# Patient Record
Sex: Male | Born: 1945 | Race: White | Hispanic: No | Marital: Married | Smoking: Former smoker
Health system: Southern US, Community
[De-identification: ages and names within clinical notes are randomized; demographics above are authoritative.]

## PROBLEM LIST (undated history)

## (undated) DIAGNOSIS — I255 Ischemic cardiomyopathy: Secondary | ICD-10-CM

## (undated) DIAGNOSIS — E785 Hyperlipidemia, unspecified: Secondary | ICD-10-CM

## (undated) DIAGNOSIS — Z9861 Coronary angioplasty status: Secondary | ICD-10-CM

## (undated) DIAGNOSIS — I5042 Chronic combined systolic (congestive) and diastolic (congestive) heart failure: Secondary | ICD-10-CM

## (undated) DIAGNOSIS — M1990 Unspecified osteoarthritis, unspecified site: Secondary | ICD-10-CM

## (undated) DIAGNOSIS — G4730 Sleep apnea, unspecified: Secondary | ICD-10-CM

## (undated) DIAGNOSIS — E119 Type 2 diabetes mellitus without complications: Secondary | ICD-10-CM

## (undated) DIAGNOSIS — I2510 Atherosclerotic heart disease of native coronary artery without angina pectoris: Secondary | ICD-10-CM

## (undated) DIAGNOSIS — I350 Nonrheumatic aortic (valve) stenosis: Secondary | ICD-10-CM

## (undated) DIAGNOSIS — I739 Peripheral vascular disease, unspecified: Secondary | ICD-10-CM

## (undated) DIAGNOSIS — I10 Essential (primary) hypertension: Secondary | ICD-10-CM

## (undated) HISTORY — PX: JOINT REPLACEMENT: SHX530

## (undated) HISTORY — PX: COLONOSCOPY: SHX174

## (undated) HISTORY — DX: Chronic combined systolic (congestive) and diastolic (congestive) heart failure: I50.42

## (undated) HISTORY — PX: OTHER SURGICAL HISTORY: SHX169

## (undated) HISTORY — DX: Type 2 diabetes mellitus without complications: E11.9

---
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
## 2001-12-25 DIAGNOSIS — Z9861 Coronary angioplasty status: Secondary | ICD-10-CM

## 2001-12-25 DIAGNOSIS — I2510 Atherosclerotic heart disease of native coronary artery without angina pectoris: Secondary | ICD-10-CM

## 2001-12-25 HISTORY — DX: Atherosclerotic heart disease of native coronary artery without angina pectoris: Z98.61

## 2001-12-25 HISTORY — DX: Atherosclerotic heart disease of native coronary artery without angina pectoris: I25.10

## 2001-12-25 HISTORY — PX: CARDIAC CATHETERIZATION: SHX172

## 2003-12-26 HISTORY — PX: REPLACEMENT TOTAL KNEE: SUR1224

## 2004-09-27 ENCOUNTER — Ambulatory Visit: Payer: Self-pay | Admitting: *Deleted

## 2004-10-10 ENCOUNTER — Ambulatory Visit: Payer: Self-pay | Admitting: *Deleted

## 2004-12-01 ENCOUNTER — Inpatient Hospital Stay (HOSPITAL_COMMUNITY): Admission: RE | Admit: 2004-12-01 | Discharge: 2004-12-05 | Payer: Self-pay | Admitting: Specialist

## 2014-03-19 ENCOUNTER — Ambulatory Visit: Payer: Self-pay | Admitting: Gastroenterology

## 2014-03-22 LAB — PATHOLOGY REPORT

## 2014-10-08 DIAGNOSIS — G609 Hereditary and idiopathic neuropathy, unspecified: Secondary | ICD-10-CM | POA: Insufficient documentation

## 2014-12-10 ENCOUNTER — Ambulatory Visit (INDEPENDENT_AMBULATORY_CARE_PROVIDER_SITE_OTHER): Payer: Medicare Other

## 2014-12-10 ENCOUNTER — Ambulatory Visit (INDEPENDENT_AMBULATORY_CARE_PROVIDER_SITE_OTHER): Payer: Medicare Other | Admitting: Podiatry

## 2014-12-10 ENCOUNTER — Encounter: Payer: Self-pay | Admitting: Podiatry

## 2014-12-10 VITALS — BP 158/85 | HR 73 | Resp 16 | Ht 75.0 in | Wt 280.0 lb

## 2014-12-10 DIAGNOSIS — M2580 Other specified joint disorders, unspecified joint: Secondary | ICD-10-CM

## 2014-12-10 DIAGNOSIS — M205X2 Other deformities of toe(s) (acquired), left foot: Secondary | ICD-10-CM

## 2014-12-10 DIAGNOSIS — M722 Plantar fascial fibromatosis: Secondary | ICD-10-CM

## 2014-12-10 NOTE — Progress Notes (Signed)
Subjective:    Patient ID: Kurt Kelley, male    DOB: Jun 29, 1946, 68 y.o.   MRN: [id number]  HPI Comments: 68 year old male presents the office today with left heel pain which has been ongoing for proximally 30 years. He states that he has pain also around the bottom of his big toe and the ball of the foot. He states that he has pain in his foot after sitting for periods of time or sleeping. He states that after he walks for short period of time he does have some resolution of symptoms. Patient also states he has poor circulation in his left leg and he is previously seen a vascular surgeon other physicians for this. He states that he is unable to walk greater than one half a block due to pain in his leg and he gets cramping in his leg. He is previously undergone multiple noninvasive vascular testing. He states he is also previous is seen a foot specialist approximately 10 years ago where he was given an injection for plantar fasciitis which helped. He states that the majority of his pain is in the ball of his first big toe joint more than the heel this time. No recent injury or trauma to the area. No other complaints at this time.  Foot Pain      Review of Systems  Cardiovascular:       Calf pain  Gastrointestinal:       Bloating  Skin:       Change in nails  Neurological: Positive for tremors.  All other systems reviewed and are negative.      Objective:   Physical Exam AAO 3, NAD DP/PT pulses are palpable bilaterally, CRT less than 3 seconds Protective sensation intact with Simms Weinstein monofilament, vibratory sensation intact, Achilles tendon reflex intact. There is mild tenderness to palpation overlying the plantar medial tubercle of the calcaneus on the left side at the insertion of the plantar fascia. There is no pain on the course of the plantar fascia within the arch of the foot and the ligament appears to be intact. There is no pain on the posterior aspect of the  calcaneus or along the course/insertion of the Achilles tendon. No pain with lateral compression of the calcaneus or pain with vibratory sensation. There is discomfort overlying the sesamoids on the left foot. There is limitation of motion of the first MTPJ on the left. There is no pain with first MTPJ range of motion and there is no pain on the course of the hallux with a metatarsal. There is no overlying edema, erythema, increase in warmth to the foot/ankle. MMT 5/5, ROM WNL, except otherwise stated. There is no pain with calf compression, swelling, warmth, erythema. No open lesions or pre-ulcerative lesions.       Assessment & Plan:  68 year old male with heel pain, likely plantar fasciitis; sesamoiditis; likely PVD -X-rays were obtained and reviewed with the patient. -Conservative versus surgical treatment were discussed including alternatives, risks, complications. -Discussed likely etiologies of his pain. -At this time as the sesamoids appear to be more symptomatic, discussed a steroid injection into this area which she agrees. Verbal consent was obtained. Under sterile conditions a total of 2 mL of a mixture of dexamethasone phosphate and 0.5% Marcaine plain, 2% lidocaine plain was infiltrated around the sesamoid complex without complications. Band-Aid was applied. Post injection instructions were discussed with the patient. -Follow-up in 2 weeks. At that time if he continues to have pain in the heel  we'll consider steroid injection into the heel at that time as well. However this time the heel was not as symptomatic as the sesamoids. Discussed stretching exercises. Patient states that he does not like to wear anything in his shoes or on his foot and does not want to wear orthotics or plantar fascial brace. -Recommended to continue to follow up with vascular surgery as some of the symptoms do appear to be PVD related. -Follow-up in 2 weeks or sooner if any problems should arise. (He will be going  on vacation so discussed follow-up as needed). In the meantime, call the office with any questions, concerns, change in symptoms.

## 2015-01-19 ENCOUNTER — Ambulatory Visit: Payer: Medicare Other | Admitting: Podiatry

## 2015-07-20 DIAGNOSIS — I2510 Atherosclerotic heart disease of native coronary artery without angina pectoris: Secondary | ICD-10-CM | POA: Insufficient documentation

## 2015-07-20 DIAGNOSIS — E782 Mixed hyperlipidemia: Secondary | ICD-10-CM | POA: Insufficient documentation

## 2016-10-06 DIAGNOSIS — M47812 Spondylosis without myelopathy or radiculopathy, cervical region: Secondary | ICD-10-CM | POA: Insufficient documentation

## 2016-10-09 ENCOUNTER — Other Ambulatory Visit: Payer: Self-pay | Admitting: Surgery

## 2016-10-09 DIAGNOSIS — M47812 Spondylosis without myelopathy or radiculopathy, cervical region: Secondary | ICD-10-CM

## 2016-10-31 ENCOUNTER — Ambulatory Visit: Admission: RE | Admit: 2016-10-31 | Payer: Medicare Other | Source: Ambulatory Visit

## 2016-11-10 ENCOUNTER — Ambulatory Visit
Admission: RE | Admit: 2016-11-10 | Discharge: 2016-11-10 | Disposition: A | Discharge status: Home / Self Care | Payer: Medicare Other | Source: Ambulatory Visit | Attending: Surgery | Admitting: Surgery

## 2016-11-10 ENCOUNTER — Encounter (INDEPENDENT_AMBULATORY_CARE_PROVIDER_SITE_OTHER): Payer: Self-pay

## 2016-11-10 DIAGNOSIS — M47812 Spondylosis without myelopathy or radiculopathy, cervical region: Secondary | ICD-10-CM | POA: Insufficient documentation

## 2016-11-10 DIAGNOSIS — M4802 Spinal stenosis, cervical region: Secondary | ICD-10-CM | POA: Insufficient documentation

## 2016-12-20 DIAGNOSIS — M5412 Radiculopathy, cervical region: Secondary | ICD-10-CM | POA: Insufficient documentation

## 2016-12-20 DIAGNOSIS — G5603 Carpal tunnel syndrome, bilateral upper limbs: Secondary | ICD-10-CM | POA: Insufficient documentation

## 2017-01-17 DIAGNOSIS — J449 Chronic obstructive pulmonary disease, unspecified: Secondary | ICD-10-CM | POA: Insufficient documentation

## 2017-01-17 DIAGNOSIS — G4733 Obstructive sleep apnea (adult) (pediatric): Secondary | ICD-10-CM | POA: Insufficient documentation

## 2017-01-23 HISTORY — PX: CERVICAL SPINE SURGERY: SHX589

## 2017-10-15 ENCOUNTER — Encounter: Payer: Self-pay | Admitting: *Deleted

## 2017-10-24 ENCOUNTER — Encounter
Admission: RE | Disposition: A | Discharge status: Home / Self Care | Payer: Self-pay | Source: Ambulatory Visit | Attending: Surgery

## 2017-10-24 ENCOUNTER — Ambulatory Visit: Payer: Medicare Other | Admitting: Anesthesiology

## 2017-10-24 ENCOUNTER — Encounter: Payer: Self-pay | Admitting: Anesthesiology

## 2017-10-24 ENCOUNTER — Ambulatory Visit
Admission: RE | Admit: 2017-10-24 | Discharge: 2017-10-24 | Disposition: A | Discharge status: Home / Self Care | Payer: Medicare Other | Source: Ambulatory Visit | Attending: Surgery | Admitting: Surgery

## 2017-10-24 DIAGNOSIS — Z87891 Personal history of nicotine dependence: Secondary | ICD-10-CM | POA: Insufficient documentation

## 2017-10-24 DIAGNOSIS — Z6836 Body mass index (BMI) 36.0-36.9, adult: Secondary | ICD-10-CM | POA: Insufficient documentation

## 2017-10-24 DIAGNOSIS — G5601 Carpal tunnel syndrome, right upper limb: Secondary | ICD-10-CM | POA: Diagnosis not present

## 2017-10-24 DIAGNOSIS — M1990 Unspecified osteoarthritis, unspecified site: Secondary | ICD-10-CM | POA: Diagnosis not present

## 2017-10-24 DIAGNOSIS — I10 Essential (primary) hypertension: Secondary | ICD-10-CM | POA: Diagnosis not present

## 2017-10-24 DIAGNOSIS — G4730 Sleep apnea, unspecified: Secondary | ICD-10-CM | POA: Insufficient documentation

## 2017-10-24 HISTORY — DX: Essential (primary) hypertension: I10

## 2017-10-24 HISTORY — PX: CARPAL TUNNEL RELEASE: SHX101

## 2017-10-24 HISTORY — DX: Sleep apnea, unspecified: G47.30

## 2017-10-24 HISTORY — DX: Unspecified osteoarthritis, unspecified site: M19.90

## 2017-10-24 SURGERY — RELEASE, CARPAL TUNNEL, ENDOSCOPIC
Anesthesia: Regional | Laterality: Right | Wound class: Clean

## 2017-10-24 MED ORDER — HYDROCODONE-ACETAMINOPHEN 5-325 MG PO TABS: 1.0000 | ORAL_TABLET | Freq: Four times a day (QID) | ORAL | 0 refills | Status: DC | PRN

## 2017-10-24 MED ORDER — ACETAMINOPHEN 160 MG/5ML PO SOLN: 325.0000 mg | ORAL | Status: DC | PRN

## 2017-10-24 MED ORDER — PROPOFOL 500 MG/50ML IV EMUL
INTRAVENOUS | Status: DC | PRN
  Administered 2017-10-24: 75 ug/kg/min via INTRAVENOUS

## 2017-10-24 MED ORDER — FENTANYL CITRATE (PF) 100 MCG/2ML IJ SOLN
INTRAMUSCULAR | Status: DC | PRN
  Administered 2017-10-24: 100 ug via INTRAVENOUS

## 2017-10-24 MED ORDER — ACETAMINOPHEN 325 MG PO TABS: 325.0000 mg | ORAL_TABLET | ORAL | Status: DC | PRN

## 2017-10-24 MED ORDER — FENTANYL CITRATE (PF) 100 MCG/2ML IJ SOLN: 25.0000 ug | INTRAMUSCULAR | Status: DC | PRN

## 2017-10-24 MED ORDER — ONDANSETRON HCL 4 MG/2ML IJ SOLN
INTRAMUSCULAR | Status: DC | PRN
  Administered 2017-10-24: 4 mg via INTRAVENOUS

## 2017-10-24 MED ORDER — SODIUM CHLORIDE 0.9 % IV SOLN
900.0000 mg | Freq: Once | INTRAVENOUS | Status: AC
  Administered 2017-10-24: 900 mg via INTRAVENOUS

## 2017-10-24 MED ORDER — LACTATED RINGERS IV SOLN
10.0000 mL/h | INTRAVENOUS | Status: DC
  Administered 2017-10-24: 10 mL/h via INTRAVENOUS

## 2017-10-24 MED ORDER — OXYCODONE HCL 5 MG PO TABS: 5.0000 mg | ORAL_TABLET | Freq: Once | ORAL | Status: DC | PRN

## 2017-10-24 MED ORDER — OXYCODONE HCL 5 MG/5ML PO SOLN: 5.0000 mg | Freq: Once | ORAL | Status: DC | PRN

## 2017-10-24 MED ORDER — MIDAZOLAM HCL 5 MG/5ML IJ SOLN
INTRAMUSCULAR | Status: DC | PRN
  Administered 2017-10-24: 2 mg via INTRAVENOUS

## 2017-10-24 MED ORDER — BUPIVACAINE HCL (PF) 0.5 % IJ SOLN
INTRAMUSCULAR | Status: DC | PRN
  Administered 2017-10-24: 10 mL

## 2017-10-24 MED ORDER — LIDOCAINE HCL (CARDIAC) 20 MG/ML IV SOLN
INTRAVENOUS | Status: DC | PRN
  Administered 2017-10-24: 50 mg via INTRAVENOUS

## 2017-10-24 SURGICAL SUPPLY — 28 items
BANDAGE ELASTIC 2 LF NS (GAUZE/BANDAGES/DRESSINGS) ×2 IMPLANT
BNDG CMPR MED 5X2 ELC HKLP NS (GAUZE/BANDAGES/DRESSINGS) ×1
BNDG COHESIVE 4X5 TAN STRL (GAUZE/BANDAGES/DRESSINGS) ×2 IMPLANT
BNDG ESMARK 4X12 TAN STRL LF (GAUZE/BANDAGES/DRESSINGS) ×2 IMPLANT
CHLORAPREP W/TINT 26ML (MISCELLANEOUS) ×2 IMPLANT
CORD BIP STRL DISP 12FT (MISCELLANEOUS) ×2 IMPLANT
COVER LIGHT HANDLE UNIVERSAL (MISCELLANEOUS) ×4 IMPLANT
CUFF TOURNIQUET DUAL PORT 18X3 (MISCELLANEOUS) ×2 IMPLANT
DRAPE SURG 17X11 SM STRL (DRAPES) ×2 IMPLANT
GAUZE PETRO XEROFOAM 1X8 (MISCELLANEOUS) ×2 IMPLANT
GAUZE SPONGE 4X4 12PLY STRL (GAUZE/BANDAGES/DRESSINGS) ×2 IMPLANT
GLOVE BIO SURGEON STRL SZ8 (GLOVE) ×2 IMPLANT
GLOVE INDICATOR 8.0 STRL GRN (GLOVE) ×2 IMPLANT
GOWN STRL REUS W/ TWL LRG LVL3 (GOWN DISPOSABLE) ×1 IMPLANT
GOWN STRL REUS W/ TWL XL LVL3 (GOWN DISPOSABLE) ×1 IMPLANT
GOWN STRL REUS W/TWL LRG LVL3 (GOWN DISPOSABLE) ×2
GOWN STRL REUS W/TWL XL LVL3 (GOWN DISPOSABLE) ×2
KIT CARPAL TUNNEL (MISCELLANEOUS) ×2
KIT ESCP INSRT D SLOT CANN KN (MISCELLANEOUS) ×1 IMPLANT
KIT ROOM TURNOVER OR (KITS) ×2 IMPLANT
NS IRRIG 500ML POUR BTL (IV SOLUTION) ×2 IMPLANT
PACK EXTREMITY ARMC (MISCELLANEOUS) ×2 IMPLANT
SPLINT WRIST/FOREARM XL RT TX9 (SOFTGOODS) ×1 IMPLANT
STOCKINETTE IMPERVIOUS 9X36 MD (GAUZE/BANDAGES/DRESSINGS) ×2 IMPLANT
STRAP BODY AND KNEE 60X3 (MISCELLANEOUS) ×2 IMPLANT
SUT PROLENE 4 0 PS 2 18 (SUTURE) ×2 IMPLANT
SUT VIC AB 3-0 SH 27 (SUTURE)
SUT VIC AB 3-0 SH 27X BRD (SUTURE) IMPLANT

## 2017-10-24 NOTE — Anesthesia Procedure Notes (Signed)
Anesthesia Regional Block: Bier block (IV Regional)   Pre-Anesthetic Checklist: ,, timeout performed, Correct Patient, Correct Site, Correct Laterality, Correct Procedure, Correct Position, site marked, Risks and benefits discussed,  Surgical consent,  Pre-op evaluation,  At surgeon's request and post-op pain management  Laterality: Right      Procedures:,,,,, intact distal pulses, Esmarch exsanguination,, #20gu IV placed and double tourniquet utilized  Narrative:  Start time: 10/24/2017 12:51 PM End time: 10/24/2017 12:52 PM  Performed by: With CRNAs  Anesthesiologist: Jola Babinski CRNA: Jimmy Picket  Additional Notes: Proximal and Distal cuffs inflated after exsanguination. Distal cuff deflated.Negative radial pulse noted. IV injected with 28mL 0.5% Lidocaine plain. Tolerated well by pt.

## 2017-10-24 NOTE — Anesthesia Preprocedure Evaluation (Addendum)
Anesthesia Evaluation    Airway Mallampati: II  TM Distance: >3 FB     Dental   Pulmonary sleep apnea , former smoker,    breath sounds clear to auscultation       Cardiovascular hypertension, (-) angina Rhythm:Regular Rate:Normal  Stress test 10/08/17 negative for ischemia  TTE 10/08/17: MILD LV SYSTOLIC DYSFUNCTION WITH MODERATE LVH NORMAL RV FUNCTION MILD to MODERATE MR MILD TR, AR MILD AS EF 40-45%   Neuro/Psych negative neurological ROS     GI/Hepatic negative GI ROS,   Endo/Other  Morbid obesity (BMI 36)  Renal/GU      Musculoskeletal  (+) Arthritis ,   Abdominal   Peds  Hematology negative hematology ROS (+)   Anesthesia Other Findings   Reproductive/Obstetrics                          Anesthesia Physical Anesthesia Plan  ASA: III  Anesthesia Plan: Bier Block and General   Post-op Pain Management:    Induction:   PONV Risk Score and Plan:   Airway Management Planned: Nasal Cannula and Natural Airway  Additional Equipment:   Intra-op Plan:   Post-operative Plan:   Informed Consent: I have reviewed the patients History and Physical, chart, labs and discussed the procedure including the risks, benefits and alternatives for the proposed anesthesia with the patient or authorized representative who has indicated his/her understanding and acceptance.     Plan Discussed with: CRNA  Anesthesia Plan Comments:         Anesthesia Quick Evaluation

## 2017-10-24 NOTE — Transfer of Care (Signed)
Immediate Anesthesia Transfer of Care Note  Patient: Kurt Kelley  Procedure(s) Performed: CARPAL TUNNEL RELEASE ENDOSCOPIC (Right )  Patient Location: PACU  Anesthesia Type: Bier Block, General  Level of Consciousness: awake, alert  and patient cooperative  Airway and Oxygen Therapy: Patient Spontanous Breathing and Patient connected to supplemental oxygen  Post-op Assessment: Post-op Vital signs reviewed, Patient's Cardiovascular Status Stable, Respiratory Function Stable, Patent Airway and No signs of Nausea or vomiting  Post-op Vital Signs: Reviewed and stable  Complications: No apparent anesthesia complications

## 2017-10-24 NOTE — Discharge Instructions (Addendum)
General Anesthesia, Adult, Care After These instructions provide you with information about caring for yourself after your procedure. Your health care provider may also give you more specific instructions. Your treatment has been planned according to current medical practices, but problems sometimes occur. Call your health care provider if you have any problems or questions after your procedure. What can I expect after the procedure? After the procedure, it is common to have:  Vomiting.  A sore throat.  Mental slowness.  It is common to feel:  Nauseous.  Cold or shivery.  Sleepy.  Tired.  Sore or achy, even in parts of your body where you did not have surgery.  Follow these instructions at home: For at least 24 hours after the procedure:  Do not: ? Participate in activities where you could fall or become injured. ? Drive. ? Use heavy machinery. ? Drink alcohol. ? Take sleeping pills or medicines that cause drowsiness. ? Make important decisions or sign legal documents. ? Take care of children on your own.  Rest. Eating and drinking  If you vomit, drink water, juice, or soup when you can drink without vomiting.  Drink enough fluid to keep your urine clear or pale yellow.  Make sure you have little or no nausea before eating solid foods.  Follow the diet recommended by your health care provider. General instructions  Have a responsible adult stay with you until you are awake and alert.  Return to your normal activities as told by your health care provider. Ask your health care provider what activities are safe for you.  Take over-the-counter and prescription medicines only as told by your health care provider.  If you smoke, do not smoke without supervision.  Keep all follow-up visits as told by your health care provider. This is important. Contact a health care provider if:  You continue to have nausea or vomiting at home, and medicines are not helpful.  You  cannot drink fluids or start eating again.  You cannot urinate after 8-12 hours.  You develop a skin rash.  You have fever.  You have increasing redness at the site of your procedure. Get help right away if:  You have difficulty breathing.  You have chest pain.  You have unexpected bleeding.  You feel that you are having a life-threatening or urgent problem. This information is not intended to replace advice given to you by your health care provider. Make sure you discuss any questions you have with your health care provider. Document Released: 03/19/2001 Document Revised: 05/15/2016 Document Reviewed: 11/25/2015 Elsevier Interactive Patient Education  2018 [employer].   Keep dressing dry and intact. Keep hand elevated above heart level. May shower after dressing removed on postop day 4 (Sunday). Cover sutures with Band-Aids after drying off, then re-apply Velcro splint. Apply ice to affected area frequently. Take ibuprofen 800 mg TID with meals for 7-10 days, then as necessary. Take pain medication as prescribed when needed.  Return for follow-up in 10-14 days or as scheduled.

## 2017-10-24 NOTE — H&P (Signed)
Paper H&P to be scanned into permanent record. H&P reviewed and patient re-examined. No changes. 

## 2017-10-24 NOTE — Op Note (Signed)
10/24/2017  1:29 PM  Patient:   Kurt Kelley  Pre-Op Diagnosis:   Right carpal tunnel syndrome.  Post-Op Diagnosis:   Same.  Procedure:   Endoscopic right carpal tunnel release.  Surgeon:   Maryagnes Amos, MD  Anesthesia:   Bier block  Findings:   As above.  Complications:   None  EBL:   0 cc  Fluids:   750 cc crystalloid  TT:   30 minutes at 250 mmHg  Drains:   None  Closure:   4-0 Prolene interrupted sutures  Brief Clinical Note:   The patient is a 71 year old male with a history of bilateral hand and wrist pain and paresthesias, right more symptomatically left.  A recent EMG has confirmed the presence of bilateral carpal tunnel syndrome.  His symptoms have persisted despite medications, activity modification, splinting, etc.  The patient presents at this time for an endoscopic right carpal tunnel release.   Procedure:   The patient was brought into the operating room and lain in the supine position. After adequate IV sedation was achieved, a timeout was performed to verify the appropriate surgical site before a Bier block was placed by the anesthesiologist and the tourniquet inflated to 250 mmHg. The right hand and upper extremity were prepped with ChloraPrep solution before being draped sterilely. Preoperative antibiotics were administered. An approximately 1.5-2 cm incision was made over the volar wrist flexion crease, centered over the palmaris longus tendon. The incision was carried down through the subcutaneous tissues with care taken to identify and protect any neurovascular structures. The distal forearm fascia was penetrated just proximal to the transverse carpal ligament. The soft tissues were released off the superficial and deep surfaces of the distal forearm fascia and this was released proximally for 3-4 cm under direct visualization.  Attention was directed distally. The Therapist, nutritional was passed beneath the transverse carpal ligament along the ulnar aspect of  the carpal tunnel and used to release any adhesions as well as to remove any adherent synovial tissue before first the smaller then the larger of the two dilators were passed beneath the transverse carpal ligament along the ulnar margin of the carpal tunnel. The slotted cannula was introduced and the endoscope was placed into the slotted cannula and the undersurface of the transverse carpal ligament visualized. The distal margin of the transverse carpal ligament was marked by placing a 25-gauge needle percutaneously at Kaplan's cardinal point so that it entered the distal portion of the slotted cannula. Under endoscopic visualization, the transverse carpal ligament was released from proximal to distal using the end-cutting blade. A second pass was performed to ensure complete release of the ligament. The adequacy of release was verified both endoscopically and by palpation using the freer elevator.  The wound was irrigated thoroughly with sterile saline solution before being closed using 4-0 Prolene interrupted sutures. A total of 10 cc of 0.5% plain Sensorcaine was injected in and around the incision before a sterile bulky dressing was applied to the wound. The patient was placed into a volar wrist splint before being awakened and returned to the recovery room in satisfactory condition after tolerating the procedure well.

## 2017-10-24 NOTE — Anesthesia Postprocedure Evaluation (Signed)
Anesthesia Post Note  Patient: Kurt Kelley  Procedure(s) Performed: CARPAL TUNNEL RELEASE ENDOSCOPIC (Right )  Patient location during evaluation: PACU Anesthesia Type: Bier Block Level of consciousness: awake Pain management: pain level controlled Vital Signs Assessment: post-procedure vital signs reviewed and stable Respiratory status: respiratory function stable Cardiovascular status: stable Postop Assessment: no signs of nausea or vomiting Anesthetic complications: no    Jola Babinski

## 2017-10-24 NOTE — Anesthesia Procedure Notes (Signed)
Procedure Name: MAC Performed by: Lucky Trotta Pre-anesthesia Checklist: Patient identified, Emergency Drugs available, Suction available, Patient being monitored and Timeout performed Patient Re-evaluated:Patient Re-evaluated prior to inductionOxygen Delivery Method: Simple face mask Placement Confirmation: positive ETCO2 and breath sounds checked- equal and bilateral       

## 2017-10-25 ENCOUNTER — Encounter: Payer: Self-pay | Admitting: Surgery

## 2018-02-06 DIAGNOSIS — I255 Ischemic cardiomyopathy: Secondary | ICD-10-CM | POA: Insufficient documentation

## 2018-04-09 ENCOUNTER — Ambulatory Visit (INDEPENDENT_AMBULATORY_CARE_PROVIDER_SITE_OTHER): Payer: Medicare Other | Admitting: Cardiovascular Disease

## 2018-04-09 ENCOUNTER — Encounter: Payer: Self-pay | Admitting: Cardiovascular Disease

## 2018-04-09 VITALS — BP 170/80 | HR 87 | Ht 75.0 in | Wt 287.5 lb

## 2018-04-09 DIAGNOSIS — I5022 Chronic systolic (congestive) heart failure: Secondary | ICD-10-CM

## 2018-04-09 DIAGNOSIS — I25118 Atherosclerotic heart disease of native coronary artery with other forms of angina pectoris: Secondary | ICD-10-CM

## 2018-04-09 DIAGNOSIS — I739 Peripheral vascular disease, unspecified: Secondary | ICD-10-CM

## 2018-04-09 DIAGNOSIS — E785 Hyperlipidemia, unspecified: Secondary | ICD-10-CM

## 2018-04-09 DIAGNOSIS — I350 Nonrheumatic aortic (valve) stenosis: Secondary | ICD-10-CM

## 2018-04-09 MED ORDER — EVOLOCUMAB 140 MG/ML ~~LOC~~ SOSY: 1.0000 "pen " | PREFILLED_SYRINGE | SUBCUTANEOUS | 11 refills | Status: DC

## 2018-04-09 NOTE — Progress Notes (Signed)
Cardiology Office Note   Date:  04/09/2018   ID:  Kurt Kelley, DOB [birth year], MRN [id number]  PCP:  Jerl Mina, MD  Cardiologist:   Lorine Bears, MD   Chief Complaint  Patient presents with  . New Patient (Initial Visit)    Referred by [facility] for CAD. Patient c/o SOB when lying down at night and leg pain in both legs for about 20 Years. Meds reviewed verballly with patient.       History of Present Illness: Kurt Kelley is a 72 y.o. male who is here to establish cardiovascular care.  He is transferring from Dr. Gwen Pounds.  The patient has known history of coronary artery disease with previous stent placement in 2003, chronic systolic heart failure due to mild ischemic cardiomyopathy, mild aortic stenosis, essential hypertension, hyperlipidemia, sleep apnea and obesity.  He has known history of intolerance to statins due to severe myalgia.  He also reports intolerance to CPAP due to claustrophobia.  He is aware of history of peripheral arterial disease and was seen many years ago by Dr. Orson Slick and was told about moderate 50% disease that did not require revascularization. The patient reports prolonged history of bilateral leg pain with exertion for more than 10 years.  The symptoms are mostly in both calfs.  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 He reports mild symptoms of exertional dyspnea and occasional chest pain. Most recent nuclear stress test in October 2018 showed normal perfusion with ejection fraction of 39%.  He had an echocardiogram done which showed an EF of 40-45% with mild aortic stenosis with reported valve area of 1.2 and mild to moderate MR.    Past Medical History:  Diagnosis Date  . Aortic valve stenosis - mild    TTE 10/08/17  . Arthritis    lower back, right knee  . Heart murmur   . Hypertension   . Sleep apnea     Past Surgical History:  Procedure Laterality Date  . CARDIAC CATHETERIZATION  2004   stent    . CARPAL TUNNEL RELEASE Right 10/24/2017   Procedure: CARPAL TUNNEL RELEASE ENDOSCOPIC;  Surgeon: Christena Flake, MD;  Location[facility];  Service: Orthopedics;  Laterality: Right;  sleep apnea  . CERVICAL SPINE SURGERY Right 01/23/2017   arthrodesis, discectomy, osteophytectomy, decompression  C3-C4, [facility]  . COLONOSCOPY    . KNEE SURGERY Right 1966  . REPLACEMENT TOTAL KNEE Left 2005     Current Outpatient Medications  Medication Sig Dispense Refill  . albuterol (PROVENTIL HFA;VENTOLIN HFA) 108 (90 Base) MCG/ACT inhaler Inhale into the lungs every 6 (six) hours as needed for wheezing or shortness of breath.    Marland Kitchen aspirin 81 MG tablet Take 81 mg by mouth daily.    Marland Kitchen doxazosin (CARDURA) 2 MG tablet Take by mouth.    . fenofibrate (TRICOR) 145 MG tablet Take by mouth.    . finasteride (PROSCAR) 5 MG tablet Take by mouth.    Marland Kitchen ibuprofen (ADVIL,MOTRIN) 800 MG tablet Take 800 mg by mouth daily as needed.    Marland Kitchen losartan (COZAAR) 100 MG tablet Take by mouth.    . senna-docusate (SENOKOT S) 8.6-50 MG tablet Take 1 tablet by mouth 2 (two) times daily as needed for mild constipation.     No current facility-administered medications for this visit.     Allergies:   Ampicillin and Capsicum    Social History:  The patient  reports that he  quit smoking about 31 years ago. He has never used smokeless tobacco. He reports that he drinks about 6.6 oz of alcohol per week.   Family History:  The patient's family history is not on file.    ROS:  Please see the history of present illness.   Otherwise, review of systems are positive for none.   All other systems are reviewed and negative.    PHYSICAL EXAM: VS:  BP (!) 170/80 (BP Location: Right Arm, Patient Position: Sitting, Cuff Size: Large)   Pulse 87   Ht 6\' 3"  (1.905 m)   Wt 287 lb 8 oz (130.4 kg)   BMI 35.94 kg/m  , BMI Body mass index is 35.94 kg/m. GEN: Well nourished, well developed, in no acute distress  HEENT: normal   Neck: no JVD, carotid bruits, or masses Cardiac: RRR; no rubs, or gallops,no edema .  2 out of 6 systolic ejection murmur in the aortic area. Respiratory:  clear to auscultation bilaterally, normal work of breathing GI: soft, nontender, nondistended, + BS MS: no deformity or atrophy  Skin: warm and dry, no rash Neuro:  Strength and sensation are intact Psych: euthymic mood, full affect Vascular: Femoral pulses are normal.  Distal pulses are not palpable.   EKG:  EKG is ordered today. The ekg ordered today demonstrates normal sinus rhythm with nonspecific ST changes.   Recent Labs: No results found for requested labs within last 8760 hours.    Lipid Panel No results found for: CHOL, TRIG, HDL, CHOLHDL, VLDL, LDLCALC, LDLDIRECT    Wt Readings from Last 3 Encounters:  04/09/18 287 lb 8 oz (130.4 kg)  10/24/17 286 lb (129.7 kg)  12/10/14 280 lb (127 kg)       PAD Screen 04/09/2018  Previous PAD dx? No  Previous surgical procedure? No  Pain with walking? Yes  Subsides with rest? No  Feet/toe relief with dangling? No  Painful, non-healing ulcers? No  Extremities discolored? Yes      ASSESSMENT AND PLAN:  1.  Peripheral arterial disease: The patient seems to have severe bilateral calf claudication likely due to SFA/popliteal disease.  I requested lower extremity arterial Doppler.  Continue treatment of risk factors.  2.  Coronary artery disease involving native coronary arteries with other forms of angina: He does report exertional dyspnea and chest pain.  Nuclear stress test in October of last year showed no significant perfusion defects but his EF was noted to be 39%.  The patient might ultimately require a right and left heart catheterization.  3.  Mild to moderate aortic stenosis: I am going to repeat his echocardiogram to evaluate his EF and follow-up on severity of aortic stenosis.  4.  Chronic systolic heart failure: Currently on losartan.  He is not on a  beta-blocker for some reasons.  I will consider adding carvedilol.  5.  Hyperlipidemia: Intolerance to statins.  Most recent lipid profile showed an LDL of 118.  I recommend treatment with a PC SK 9 inhibitor the patient was started on Repatha which has already been approved by his insurance.    Disposition:   FU with me in 1 month  Signed,  Lorine Bears, MD  04/09/2018 3:16 PM    [facility]

## 2018-04-09 NOTE — Patient Instructions (Addendum)
Medication Instructions:  Your physician has recommended you make the following change in your medication:  START Repatha 140mg /ml every 14 days. Please call the office if you need assistance in administering injection. Training is available.    Labwork: none  Testing/Procedures: Your physician has requested that you have an echocardiogram. Echocardiography is a painless test that uses sound waves to create images of your heart. It provides your doctor with information about the size and shape of your heart and how well your heart's chambers and valves are working. This procedure takes approximately one hour. There are no restrictions for this procedure.  Your physician has requested that you have a lower extremity arterial duplex. During this test, exercise and ultrasound are used to evaluate arterial blood flow in the legs. Allow one hour for this exam. There are no restrictions or special instructions.   Follow-Up: Your physician recommends that you schedule a follow-up appointment in: 1 month with Dr. Kirke Corin.    Any Other Special Instructions Will Be Listed Below (If Applicable).     If you need a refill on your cardiac medications before your next appointment, please call your pharmacy.

## 2018-04-19 ENCOUNTER — Telehealth: Payer: Self-pay | Admitting: Cardiovascular Disease

## 2018-04-19 NOTE — Telephone Encounter (Signed)
Please review for refill on repatha.

## 2018-04-19 NOTE — Telephone Encounter (Signed)
Pt wife calling asking if we can please send in orders for repatha as a pen and not a syringe  Please call this into [facility] mail [facility]

## 2018-04-22 ENCOUNTER — Other Ambulatory Visit: Payer: Self-pay | Admitting: Cardiovascular Disease

## 2018-04-22 DIAGNOSIS — I739 Peripheral vascular disease, unspecified: Secondary | ICD-10-CM

## 2018-04-22 MED ORDER — EVOLOCUMAB 140 MG/ML ~~LOC~~ SOSY: 1.0000 "pen " | PREFILLED_SYRINGE | SUBCUTANEOUS | 11 refills | Status: DC

## 2018-04-22 NOTE — Telephone Encounter (Signed)
Pharmacist reports that they already sent the prescription to the mail order but provided me with the information to send again.

## 2018-04-22 NOTE — Telephone Encounter (Signed)
Left voicemail message that this medication only comes in a pen and not other syringe with instructions to call back if she should have any further questions. Refill sent into speciality pharmacy as requested.

## 2018-04-23 NOTE — Telephone Encounter (Signed)
[facility] calling Needs a prior authorization of repatha for the auto injections  To contact call [phone] or Fax at [phone]

## 2018-04-24 ENCOUNTER — Ambulatory Visit (INDEPENDENT_AMBULATORY_CARE_PROVIDER_SITE_OTHER): Payer: Medicare Other

## 2018-04-24 DIAGNOSIS — I739 Peripheral vascular disease, unspecified: Secondary | ICD-10-CM

## 2018-04-24 MED ORDER — EVOLOCUMAB 140 MG/ML ~~LOC~~ SOAJ: 1.0000 "pen " | SUBCUTANEOUS | 3 refills | Status: DC

## 2018-04-24 NOTE — Addendum Note (Signed)
Addended by: Stann Mainland on: 04/24/2018 10:47 AM   Modules accepted: Orders

## 2018-04-24 NOTE — Telephone Encounter (Signed)
Notified patient's wife, ok per DPR. She was very appreciative and has number of specialty pharmacy to call if needed.

## 2018-04-24 NOTE — Telephone Encounter (Signed)
S/w representative at [facility]. He connected me to [facility]. I had received request for prior authorization and coverage determination. Representative at [facility] said the Repatha was already approved on 02/28/18 until further notice and nothing else was needed at this time. Rep. At [facility] said I could call the pharmacy back and have them run the PA number to make sure it works.  Approval Number [id number]. Ref # for call [id number]  Called [facility] back and pharmacist ran prior authorization number. It was approved with patient's insurance. I also confirmed that patient to use [employer] pen and pharmacist said this should be fine. They will contact the patient further about cost.

## 2018-04-25 ENCOUNTER — Ambulatory Visit (INDEPENDENT_AMBULATORY_CARE_PROVIDER_SITE_OTHER): Payer: Medicare Other

## 2018-04-25 ENCOUNTER — Other Ambulatory Visit: Payer: Self-pay

## 2018-04-25 DIAGNOSIS — I350 Nonrheumatic aortic (valve) stenosis: Secondary | ICD-10-CM | POA: Diagnosis not present

## 2018-04-25 MED ORDER — PERFLUTREN LIPID MICROSPHERE
1.0000 mL | INTRAVENOUS | Status: AC | PRN
  Administered 2018-04-25: 2 mL via INTRAVENOUS

## 2018-04-29 NOTE — Telephone Encounter (Signed)
Called [email or web address] and s/w representative. I let him know I had already received an approval number on 04/24/18. He said that should be all that is needed at this time and will close out the key [id number] since it is not needed at this time.

## 2018-04-29 NOTE — Telephone Encounter (Signed)
Cover my meds calling to check status of Repatha Prior Auth

## 2018-05-14 ENCOUNTER — Ambulatory Visit (INDEPENDENT_AMBULATORY_CARE_PROVIDER_SITE_OTHER): Payer: Medicare Other | Admitting: Cardiovascular Disease

## 2018-05-14 ENCOUNTER — Encounter: Payer: Self-pay | Admitting: Cardiovascular Disease

## 2018-05-14 VITALS — BP 154/77 | HR 71 | Ht 74.0 in | Wt 287.2 lb

## 2018-05-14 DIAGNOSIS — I350 Nonrheumatic aortic (valve) stenosis: Secondary | ICD-10-CM | POA: Diagnosis not present

## 2018-05-14 DIAGNOSIS — I25118 Atherosclerotic heart disease of native coronary artery with other forms of angina pectoris: Secondary | ICD-10-CM | POA: Diagnosis not present

## 2018-05-14 DIAGNOSIS — E785 Hyperlipidemia, unspecified: Secondary | ICD-10-CM

## 2018-05-14 DIAGNOSIS — I739 Peripheral vascular disease, unspecified: Secondary | ICD-10-CM | POA: Diagnosis not present

## 2018-05-14 DIAGNOSIS — I5022 Chronic systolic (congestive) heart failure: Secondary | ICD-10-CM | POA: Diagnosis not present

## 2018-05-14 MED ORDER — CARVEDILOL 3.125 MG PO TABS: 3.1250 mg | ORAL_TABLET | Freq: Two times a day (BID) | ORAL | 3 refills | Status: DC

## 2018-05-14 NOTE — Patient Instructions (Signed)
Medication Instructions: START Carvedilol 3.125 mg twice daily  If you need a refill on your cardiac medications before your next appointment, please call your pharmacy.    Follow-Up: Your physician wants you to follow-up in 4 months with Dr. Kirke Corin in [place].   Thank you for choosing [facility] at [facility]!!

## 2018-05-14 NOTE — Progress Notes (Signed)
Cardiology Office Note   Date:  05/14/2018   ID:  Kurt Kelley, DOB 1946/06/04, MRN [id number]  PCP:  Kurt Mina, MD  Cardiologist:   Kurt Bears, MD   Chief Complaint  Patient presents with  . Shortness of Breath    states some when lying down       History of Present Illness: Kurt Kelley is a 72 y.o. male who is here today for a follow-up visit.  The patient has known history of coronary artery disease with previous stent placement in 2003, chronic systolic heart failure due to mild ischemic cardiomyopathy, aortic stenosis, essential hypertension, hyperlipidemia, sleep apnea and obesity.  He has known history of intolerance to statins due to severe myalgia.  He also reports intolerance to CPAP due to claustrophobia.  He is aware of history of peripheral arterial disease and was seen many years ago by Dr. Orson Kelley and was told about moderate 50% disease that did not require revascularization. The patient reports prolonged history of bilateral leg pain with exertion for more than 10 years.  The symptoms are mostly in both calfs.  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 Most recent nuclear stress test in October 2018 showed normal perfusion with ejection fraction of 39%.  He had an echocardiogram done which showed an EF of 40-45% with mild aortic stenosis with reported valve area of 1.2 and mild to moderate MR.  I proceeded with vascular evaluation which showed mildly reduced ABI on the left side with evidence of borderline significant left SFA disease.  However, he reports that his symptoms are worse on the right side.  Exertional dyspnea is stable. An echocardiogram showed an EF of 50 to 55% with moderate aortic stenosis.  Mean gradient was 24 mmHg.   Past Medical History:  Diagnosis Date  . Aortic valve stenosis - mild    TTE 10/08/17  . Arthritis    lower back, right knee  . Heart murmur   . Hypertension   . Sleep apnea     Past  Surgical History:  Procedure Laterality Date  . CARDIAC CATHETERIZATION  2004   stent  . CARPAL TUNNEL RELEASE Right 10/24/2017   Procedure: CARPAL TUNNEL RELEASE ENDOSCOPIC;  Surgeon: Christena Flake, MD;  Location: [facility];  Service: Orthopedics;  Laterality: Right;  sleep apnea  . CERVICAL SPINE SURGERY Right 01/23/2017   arthrodesis, discectomy, osteophytectomy, decompression  C3-C4, [facility]  . COLONOSCOPY    . KNEE SURGERY Right 1966  . REPLACEMENT TOTAL KNEE Left 2005     Current Outpatient Medications  Medication Sig Dispense Refill  . albuterol (PROVENTIL HFA;VENTOLIN HFA) 108 (90 Base) MCG/ACT inhaler Inhale into the lungs every 6 (six) hours as needed for wheezing or shortness of breath.    Marland Kitchen aspirin 81 MG tablet Take 81 mg by mouth daily.    Marland Kitchen doxazosin (CARDURA) 2 MG tablet Take by mouth.    . Evolocumab (REPATHA SURECLICK) 140 MG/ML SOAJ Inject 1 pen into the skin every 14 (fourteen) days. 6 pen 3  . fenofibrate (TRICOR) 145 MG tablet Take by mouth.    . finasteride (PROSCAR) 5 MG tablet Take by mouth.    Marland Kitchen ibuprofen (ADVIL,MOTRIN) 800 MG tablet Take 800 mg by mouth daily as needed.    Marland Kitchen losartan (COZAAR) 100 MG tablet Take by mouth.    . senna-docusate (SENOKOT S) 8.6-50 MG tablet Take 1 tablet by mouth 2 (two) times daily as  needed for mild constipation.     No current facility-administered medications for this visit.     Allergies:   Ampicillin and Capsicum    Social History:  The patient  reports that he quit smoking about 31 years ago. He has never used smokeless tobacco. He reports that he drinks about 6.6 oz of alcohol per week.   Family History:  The patient's family history is not on file.    ROS:  Please see the history of present illness.   Otherwise, review of systems are positive for none.   All other systems are reviewed and negative.    PHYSICAL EXAM: VS:  BP (!) 154/77   Pulse 71   Ht 6\' 2"  (1.88 m)   Wt 287 lb 3.2 oz (130.3 kg)   SpO2  96%   BMI 36.87 kg/m  , BMI Body mass index is 36.87 kg/m. GEN: Well nourished, well developed, in no acute distress  HEENT: normal  Neck: no JVD, carotid bruits, or masses Cardiac: RRR; no rubs, or gallops,no edema .  2 out of 6 systolic ejection murmur in the aortic area. Respiratory:  clear to auscultation bilaterally, normal work of breathing GI: soft, nontender, nondistended, + BS MS: no deformity or atrophy  Skin: warm and dry, no rash Neuro:  Strength and sensation are intact Psych: euthymic mood, full affect Vascular: Femoral pulses are normal.  Distal pulses are not palpable.   EKG:  EKG is not ordered today.   Recent Labs: No results found for requested labs within last 8760 hours.    Lipid Panel No results found for: CHOL, TRIG, HDL, CHOLHDL, VLDL, LDLCALC, LDLDIRECT    Wt Readings from Last 3 Encounters:  05/14/18 287 lb 3.2 oz (130.3 kg)  04/09/18 287 lb 8 oz (130.4 kg)  10/24/17 286 lb (129.7 kg)       PAD Screen 04/09/2018  Previous PAD dx? No  Previous surgical procedure? No  Pain with walking? Yes  Subsides with rest? No  Feet/toe relief with dangling? No  Painful, non-healing ulcers? No  Extremities discolored? Yes      ASSESSMENT AND PLAN:  1.  Peripheral arterial disease: The patient has mildly reduced ABI on the left side with evidence of borderline significant disease affecting the left SFA.  However, his symptoms are worse in the right side and most likely seems to be multifactorial and not necessarily claudication.  Continue treatment of risk factors.  Continue to monitor his disease with possible repeat testing in 1 to 2 years.  2.  Coronary artery disease involving native coronary arteries with other forms of angina: His symptoms have been stable.  EF was 50 to 55% by recent echo nuclear stress test in October of last year showed no significant perfusion defects but his EF was noted to be 39%.  If symptoms worsen, the next step is a right  and left heart catheterization.  3.   moderate aortic stenosis:  Repeat echocardiogram in 1 year from now  4.  Chronic systolic heart failure: Currently on losartan.  He reports feeling bad in the past on beta-blockers but does not remember the name.  I elected to start him on small dose carvedilol 3.125 mg twice daily especially in the setting of elevated blood pressure.  5.  Hyperlipidemia: Intolerance to statins.  Most recent lipid profile showed an LDL of 118.   He is going to start Repatha next month but is trying to get some assistance from the company.  6.  Essential hypertension: Blood pressure is mildly elevated.  I added carvedilol as outlined above.  Cardura is not a good option long-term for him given his cardiac history and heart failure.  I asked him to discuss with his primary care physician about possibility of alternative treatments for his BPH such as Flomax in order to discontinue Cardura and uptitrate other heart failure medications.   Disposition:   FU with me in 4 months  Signed,  Kurt Bears, MD  05/14/2018 9:52 AM    [facility]

## 2018-05-27 NOTE — Telephone Encounter (Signed)
Patient wife mailed med assistance forms to our office on 5/21 .  Please call to discuss

## 2018-05-27 NOTE — Telephone Encounter (Signed)
Spoke with patients wife and reviewed that I have application and it is pending signature. She verbalized understanding with no further questions at this time. She did request that I fax application back to Fax # [phone]

## 2018-05-28 NOTE — Telephone Encounter (Signed)
Spoke with patients wife per release form and reviewed that after speaking with representatives based on his coverage he would not qualify for their assistance programs. She was very appreciative for the follow up and had no further questions at this time.

## 2018-06-05 ENCOUNTER — Telehealth: Payer: Self-pay | Admitting: Cardiovascular Disease

## 2018-06-05 NOTE — Telephone Encounter (Signed)
Patient wife calling, states patient will be going to pick up his Repatha pen tomorrow States that it is recommended they have a nurse show how to use it  Please call to advise if we will be able to provide assistance tomorrow

## 2018-06-05 NOTE — Telephone Encounter (Signed)
Spoke with patients wife per release form and reviewed instructions for Repatha use. She verbalized understanding of all instructions and advised that she could come in for education on how to use it if she prefers. She reports that they have a friend who has been on it for years and that he could also show them how to do it. She just wanted to confirm how to administer and verbalized understanding of our conversation. She had no further questions or concerns at this time.

## 2018-06-19 ENCOUNTER — Encounter: Payer: Self-pay | Admitting: Cardiovascular Disease

## 2018-06-24 DIAGNOSIS — I219 Acute myocardial infarction, unspecified: Secondary | ICD-10-CM

## 2018-06-24 HISTORY — DX: Acute myocardial infarction, unspecified: I21.9

## 2018-07-05 ENCOUNTER — Emergency Department: Payer: Medicare Other

## 2018-07-05 ENCOUNTER — Other Ambulatory Visit: Payer: Self-pay

## 2018-07-05 ENCOUNTER — Inpatient Hospital Stay
Admission: EM | Admit: 2018-07-05 | Discharge: 2018-07-09 | DRG: 247 | Disposition: A | Discharge status: Home / Self Care | Payer: Medicare Other | Attending: Specialist | Admitting: Specialist

## 2018-07-05 ENCOUNTER — Telehealth: Payer: Self-pay | Admitting: Cardiovascular Disease

## 2018-07-05 ENCOUNTER — Encounter: Payer: Self-pay | Admitting: Emergency Medicine

## 2018-07-05 DIAGNOSIS — I959 Hypotension, unspecified: Secondary | ICD-10-CM | POA: Diagnosis not present

## 2018-07-05 DIAGNOSIS — I2510 Atherosclerotic heart disease of native coronary artery without angina pectoris: Secondary | ICD-10-CM | POA: Diagnosis not present

## 2018-07-05 DIAGNOSIS — N400 Enlarged prostate without lower urinary tract symptoms: Secondary | ICD-10-CM | POA: Diagnosis present

## 2018-07-05 DIAGNOSIS — I252 Old myocardial infarction: Secondary | ICD-10-CM

## 2018-07-05 DIAGNOSIS — I5022 Chronic systolic (congestive) heart failure: Secondary | ICD-10-CM | POA: Diagnosis present

## 2018-07-05 DIAGNOSIS — R55 Syncope and collapse: Secondary | ICD-10-CM | POA: Diagnosis present

## 2018-07-05 DIAGNOSIS — I214 Non-ST elevation (NSTEMI) myocardial infarction: Secondary | ICD-10-CM | POA: Diagnosis present

## 2018-07-05 DIAGNOSIS — E785 Hyperlipidemia, unspecified: Secondary | ICD-10-CM | POA: Diagnosis present

## 2018-07-05 DIAGNOSIS — Z91018 Allergy to other foods: Secondary | ICD-10-CM | POA: Diagnosis not present

## 2018-07-05 DIAGNOSIS — I110 Hypertensive heart disease with heart failure: Secondary | ICD-10-CM | POA: Diagnosis present

## 2018-07-05 DIAGNOSIS — I25110 Atherosclerotic heart disease of native coronary artery with unstable angina pectoris: Secondary | ICD-10-CM | POA: Diagnosis present

## 2018-07-05 DIAGNOSIS — Z79899 Other long term (current) drug therapy: Secondary | ICD-10-CM

## 2018-07-05 DIAGNOSIS — Z96652 Presence of left artificial knee joint: Secondary | ICD-10-CM | POA: Diagnosis present

## 2018-07-05 DIAGNOSIS — Z9861 Coronary angioplasty status: Secondary | ICD-10-CM

## 2018-07-05 DIAGNOSIS — Z66 Do not resuscitate: Secondary | ICD-10-CM | POA: Diagnosis present

## 2018-07-05 DIAGNOSIS — I255 Ischemic cardiomyopathy: Secondary | ICD-10-CM | POA: Diagnosis present

## 2018-07-05 DIAGNOSIS — I350 Nonrheumatic aortic (valve) stenosis: Secondary | ICD-10-CM | POA: Diagnosis present

## 2018-07-05 DIAGNOSIS — Z6835 Body mass index (BMI) 35.0-35.9, adult: Secondary | ICD-10-CM

## 2018-07-05 DIAGNOSIS — I25118 Atherosclerotic heart disease of native coronary artery with other forms of angina pectoris: Secondary | ICD-10-CM | POA: Diagnosis not present

## 2018-07-05 DIAGNOSIS — Z87891 Personal history of nicotine dependence: Secondary | ICD-10-CM | POA: Diagnosis not present

## 2018-07-05 DIAGNOSIS — E1151 Type 2 diabetes mellitus with diabetic peripheral angiopathy without gangrene: Secondary | ICD-10-CM | POA: Diagnosis present

## 2018-07-05 DIAGNOSIS — Z955 Presence of coronary angioplasty implant and graft: Secondary | ICD-10-CM | POA: Diagnosis not present

## 2018-07-05 DIAGNOSIS — E860 Dehydration: Secondary | ICD-10-CM | POA: Diagnosis present

## 2018-07-05 DIAGNOSIS — E1165 Type 2 diabetes mellitus with hyperglycemia: Secondary | ICD-10-CM | POA: Diagnosis present

## 2018-07-05 DIAGNOSIS — I10 Essential (primary) hypertension: Secondary | ICD-10-CM | POA: Diagnosis present

## 2018-07-05 DIAGNOSIS — E662 Morbid (severe) obesity with alveolar hypoventilation: Secondary | ICD-10-CM | POA: Diagnosis present

## 2018-07-05 DIAGNOSIS — Z7982 Long term (current) use of aspirin: Secondary | ICD-10-CM | POA: Diagnosis not present

## 2018-07-05 DIAGNOSIS — Z888 Allergy status to other drugs, medicaments and biological substances status: Secondary | ICD-10-CM | POA: Diagnosis not present

## 2018-07-05 HISTORY — DX: Peripheral vascular disease, unspecified: I73.9

## 2018-07-05 HISTORY — DX: Nonrheumatic aortic (valve) stenosis: I35.0

## 2018-07-05 HISTORY — DX: Ischemic cardiomyopathy: I25.5

## 2018-07-05 HISTORY — DX: Hyperlipidemia, unspecified: E78.5

## 2018-07-05 HISTORY — DX: Coronary angioplasty status: Z98.61

## 2018-07-05 HISTORY — DX: Atherosclerotic heart disease of native coronary artery without angina pectoris: I25.10

## 2018-07-05 LAB — BASIC METABOLIC PANEL
ANION GAP: 10 (ref 5–15)
BUN: 20 mg/dL (ref 8–23)
CALCIUM: 9.9 mg/dL (ref 8.9–10.3)
CO2: 21 mmol/L — ABNORMAL LOW (ref 22–32)
Chloride: 106 mmol/L (ref 98–111)
Creatinine, Ser: 1.21 mg/dL (ref 0.61–1.24)
GFR, EST NON AFRICAN AMERICAN: 59 mL/min — AB (ref >60)
Glucose, Bld: 281 mg/dL — ABNORMAL HIGH (ref 70–99)
Potassium: 3.9 mmol/L (ref 3.5–5.1)
Sodium: 137 mmol/L (ref 135–145)

## 2018-07-05 LAB — CBC
HCT: 49.5 % (ref 40.0–52.0)
Hemoglobin: 17 g/dL (ref 13.0–18.0)
MCH: 28.8 pg (ref 26.0–34.0)
MCHC: 34.4 g/dL (ref 32.0–36.0)
MCV: 83.5 fL (ref 80.0–100.0)
Platelets: 179 10*3/uL (ref 150–440)
RBC: 5.93 MIL/uL — AB (ref 4.40–5.90)
RDW: 14.2 % (ref 11.5–14.5)
WBC: 7.2 10*3/uL (ref 3.8–10.6)

## 2018-07-05 LAB — HEMOGLOBIN A1C
Hgb A1c MFr Bld: 7.5 % — ABNORMAL HIGH (ref 4.8–5.6)
Mean Plasma Glucose: 168.55 mg/dL

## 2018-07-05 LAB — PROTIME-INR
INR: 1
Prothrombin Time: 13.1 seconds (ref 11.4–15.2)

## 2018-07-05 LAB — TROPONIN I
TROPONIN I: 0.09 ng/mL — AB (ref <0.03)
TROPONIN I: 1.04 ng/mL — AB (ref <0.03)
Troponin I: 0.32 ng/mL (ref <0.03)

## 2018-07-05 LAB — HEPARIN LEVEL (UNFRACTIONATED): Heparin Unfractionated: 0.12 IU/mL — ABNORMAL LOW (ref 0.30–0.70)

## 2018-07-05 LAB — GLUCOSE, CAPILLARY
GLUCOSE-CAPILLARY: 135 mg/dL — AB (ref 70–99)
GLUCOSE-CAPILLARY: 137 mg/dL — AB (ref 70–99)

## 2018-07-05 LAB — APTT: APTT: 29 s (ref 24–36)

## 2018-07-05 LAB — MAGNESIUM: Magnesium: 2.1 mg/dL (ref 1.7–2.4)

## 2018-07-05 MED ORDER — NITROGLYCERIN IN D5W 200-5 MCG/ML-% IV SOLN
0.0000 ug/min | Freq: Once | INTRAVENOUS | Status: AC
  Administered 2018-07-05: 5 ug/min via INTRAVENOUS
  Filled 2018-07-05: qty 250

## 2018-07-05 MED ORDER — NITROGLYCERIN 0.4 MG SL SUBL
0.4000 mg | SUBLINGUAL_TABLET | SUBLINGUAL | Status: DC | PRN
  Administered 2018-07-05 – 2018-07-07 (×10): 0.4 mg via SUBLINGUAL
  Filled 2018-07-05 (×4): qty 1

## 2018-07-05 MED ORDER — CHLORTHALIDONE 25 MG PO TABS
25.0000 mg | ORAL_TABLET | Freq: Every day | ORAL | Status: DC
  Administered 2018-07-05 – 2018-07-09 (×4): 25 mg via ORAL
  Filled 2018-07-05 (×6): qty 1

## 2018-07-05 MED ORDER — TAMSULOSIN HCL 0.4 MG PO CAPS
0.4000 mg | ORAL_CAPSULE | Freq: Every day | ORAL | Status: DC
  Administered 2018-07-05 – 2018-07-09 (×4): 0.4 mg via ORAL
  Filled 2018-07-05 (×4): qty 1

## 2018-07-05 MED ORDER — NITROGLYCERIN IN D5W 200-5 MCG/ML-% IV SOLN
0.0000 ug/min | Freq: Once | INTRAVENOUS | Status: DC
  Filled 2018-07-05: qty 250

## 2018-07-05 MED ORDER — NITROGLYCERIN 0.4 MG SL SUBL
SUBLINGUAL_TABLET | SUBLINGUAL | Status: AC
  Administered 2018-07-05: 0.4 mg via SUBLINGUAL
  Filled 2018-07-05: qty 1

## 2018-07-05 MED ORDER — HEPARIN BOLUS VIA INFUSION
3300.0000 [IU] | Freq: Once | INTRAVENOUS | Status: AC
  Administered 2018-07-05: 3300 [IU] via INTRAVENOUS
  Filled 2018-07-05: qty 3300

## 2018-07-05 MED ORDER — FINASTERIDE 5 MG PO TABS
5.0000 mg | ORAL_TABLET | Freq: Every day | ORAL | Status: DC
  Administered 2018-07-06 – 2018-07-09 (×3): 5 mg via ORAL
  Filled 2018-07-05 (×3): qty 1

## 2018-07-05 MED ORDER — ALBUTEROL SULFATE (2.5 MG/3ML) 0.083% IN NEBU: 3.0000 mL | INHALATION_SOLUTION | Freq: Four times a day (QID) | RESPIRATORY_TRACT | Status: DC | PRN

## 2018-07-05 MED ORDER — INSULIN ASPART 100 UNIT/ML ~~LOC~~ SOLN
0.0000 [IU] | Freq: Three times a day (TID) | SUBCUTANEOUS | Status: DC
  Administered 2018-07-05 – 2018-07-06 (×2): 1 [IU] via SUBCUTANEOUS
  Administered 2018-07-06: 2 [IU] via SUBCUTANEOUS
  Administered 2018-07-06 – 2018-07-07 (×3): 1 [IU] via SUBCUTANEOUS
  Filled 2018-07-05 (×6): qty 1

## 2018-07-05 MED ORDER — ONDANSETRON HCL 4 MG/2ML IJ SOLN: 4.0000 mg | Freq: Four times a day (QID) | INTRAMUSCULAR | Status: DC | PRN

## 2018-07-05 MED ORDER — HEPARIN BOLUS VIA INFUSION
4000.0000 [IU] | Freq: Once | INTRAVENOUS | Status: AC
  Administered 2018-07-05: 4000 [IU] via INTRAVENOUS
  Filled 2018-07-05: qty 4000

## 2018-07-05 MED ORDER — ACETAMINOPHEN 650 MG RE SUPP: 650.0000 mg | Freq: Four times a day (QID) | RECTAL | Status: DC | PRN

## 2018-07-05 MED ORDER — ASPIRIN 81 MG PO CHEW
CHEWABLE_TABLET | ORAL | Status: AC
  Administered 2018-07-05: 324 mg via ORAL
  Filled 2018-07-05: qty 4

## 2018-07-05 MED ORDER — CARVEDILOL 3.125 MG PO TABS
3.1250 mg | ORAL_TABLET | Freq: Two times a day (BID) | ORAL | Status: DC
  Administered 2018-07-05 – 2018-07-07 (×5): 3.125 mg via ORAL
  Filled 2018-07-05 (×5): qty 1

## 2018-07-05 MED ORDER — FENOFIBRATE 160 MG PO TABS
160.0000 mg | ORAL_TABLET | Freq: Every evening | ORAL | Status: DC
  Administered 2018-07-06 – 2018-07-09 (×3): 160 mg via ORAL
  Filled 2018-07-05 (×4): qty 1

## 2018-07-05 MED ORDER — LOSARTAN POTASSIUM 50 MG PO TABS
100.0000 mg | ORAL_TABLET | Freq: Every day | ORAL | Status: DC
  Administered 2018-07-06 – 2018-07-09 (×3): 100 mg via ORAL
  Filled 2018-07-05 (×3): qty 2

## 2018-07-05 MED ORDER — SENNA 8.6 MG PO TABS
1.0000 | ORAL_TABLET | Freq: Two times a day (BID) | ORAL | Status: DC
  Administered 2018-07-05 – 2018-07-08 (×6): 8.6 mg via ORAL
  Filled 2018-07-05 (×6): qty 1

## 2018-07-05 MED ORDER — ACETAMINOPHEN 325 MG PO TABS
650.0000 mg | ORAL_TABLET | Freq: Four times a day (QID) | ORAL | Status: DC | PRN
  Administered 2018-07-05 – 2018-07-07 (×4): 650 mg via ORAL
  Filled 2018-07-05 (×3): qty 2

## 2018-07-05 MED ORDER — DOXAZOSIN MESYLATE 2 MG PO TABS
2.0000 mg | ORAL_TABLET | Freq: Every day | ORAL | Status: DC
  Administered 2018-07-05 – 2018-07-09 (×4): 2 mg via ORAL
  Filled 2018-07-05 (×6): qty 1

## 2018-07-05 MED ORDER — ASPIRIN 81 MG PO CHEW
324.0000 mg | CHEWABLE_TABLET | Freq: Once | ORAL | Status: AC
  Administered 2018-07-05: 324 mg via ORAL

## 2018-07-05 MED ORDER — ONDANSETRON HCL 4 MG PO TABS: 4.0000 mg | ORAL_TABLET | Freq: Four times a day (QID) | ORAL | Status: DC | PRN

## 2018-07-05 MED ORDER — ASPIRIN EC 81 MG PO TBEC
81.0000 mg | DELAYED_RELEASE_TABLET | Freq: Every day | ORAL | Status: DC
  Administered 2018-07-06 – 2018-07-09 (×3): 81 mg via ORAL
  Filled 2018-07-05 (×4): qty 1

## 2018-07-05 MED ORDER — SENNOSIDES-DOCUSATE SODIUM 8.6-50 MG PO TABS
1.0000 | ORAL_TABLET | Freq: Two times a day (BID) | ORAL | Status: DC | PRN
  Filled 2018-07-05: qty 1

## 2018-07-05 MED ORDER — HEPARIN (PORCINE) IN NACL 100-0.45 UNIT/ML-% IJ SOLN
1900.0000 [IU]/h | INTRAMUSCULAR | Status: DC
  Administered 2018-07-05: 1300 [IU]/h via INTRAVENOUS
  Administered 2018-07-06: 1900 [IU]/h via INTRAVENOUS
  Administered 2018-07-06: 1700 [IU]/h via INTRAVENOUS
  Administered 2018-07-07 (×2): 1900 [IU]/h via INTRAVENOUS
  Filled 2018-07-05 (×6): qty 250

## 2018-07-05 NOTE — Progress Notes (Signed)
ANTICOAGULATION CONSULT NOTE - Follow Up Consult  Pharmacy Consult for Heparin  Indication: chest pain/ACS  Allergies  Allergen Reactions  . Ampicillin Itching  . Capsicum Swelling    Paprika and black pepper both cause swelling of the lips    Patient Measurements: Height: 6\' 2"  (188 cm) Weight: 286 lb 1.6 oz (129.8 kg) IBW/kg (Calculated) : 82.2 Heparin Dosing Weight: 110 kg   Vital Signs: Temp: 97.5 F (36.4 C) (07/12 1946) Temp Source: Oral (07/12 1946) BP: 118/57 (07/12 2301) Pulse Rate: 63 (07/12 2301)  Labs: Recent Labs    07/05/18 1241 07/05/18 1407 07/05/18 1655 07/05/18 2244  HGB 17.0  --   --   --   HCT 49.5  --   --   --   PLT 179  --   --   --   APTT  --  29  --   --   LABPROT  --  13.1  --   --   INR  --  1.00  --   --   HEPARINUNFRC  --   --   --  0.12*  CREATININE 1.21  --   --   --   TROPONINI 0.09*  --  0.32*  --     Estimated Creatinine Clearance: 81.3 mL/min (by C-G formula based on SCr of 1.21 mg/dL).   Medications:  Medications Prior to Admission  Medication Sig Dispense Refill Last Dose  . albuterol (PROVENTIL HFA;VENTOLIN HFA) 108 (90 Base) MCG/ACT inhaler Inhale into the lungs every 6 (six) hours as needed for wheezing or shortness of breath.   Taking  . aspirin 81 MG tablet Take 81 mg by mouth daily.   Taking  . carvedilol (COREG) 3.125 MG tablet Take 1 tablet (3.125 mg total) by mouth 2 (two) times daily. 180 tablet 3   . chlorthalidone (HYGROTON) 25 MG tablet Take 25 mg by mouth daily.  4   . doxazosin (CARDURA) 2 MG tablet Take 2 mg by mouth daily.    Taking  . Evolocumab (REPATHA SURECLICK) 140 MG/ML SOAJ Inject 1 pen into the skin every 14 (fourteen) days. 6 pen 3 Taking  . fenofibrate (TRICOR) 145 MG tablet Take 145 mg by mouth daily.    Taking  . finasteride (PROSCAR) 5 MG tablet Take 5 mg by mouth daily.    Taking  . ibuprofen (ADVIL,MOTRIN) 800 MG tablet Take 800 mg by mouth daily as needed.   Taking  . losartan (COZAAR) 100  MG tablet Take 100 mg by mouth daily.    Taking  . senna-docusate (SENOKOT S) 8.6-50 MG tablet Take 1 tablet by mouth 2 (two) times daily as needed for mild constipation.   Taking  . tamsulosin (FLOMAX) 0.4 MG CAPS capsule Take 1 capsule by mouth daily.  11     Assessment: Pharmacy consulted for heparin drip dosing and monitoring for ACS/Chest Pain in 72yo male. No anticoagulants reported PTA.    Goal of Therapy:  Heparin level 0.3-0.7 units/ml Monitor platelets by anticoagulation protocol: Yes   Plan:  Baseline labs ordered Dosing Weight: 110kg Give 4000 units bolus x 1 Start heparin infusion at 1300 units/hr Check anti-Xa level in 8 hours and daily while on heparin Continue to monitor H&H and platelets  7/12:  HL @ 22:44 = 0.12 Will order Heparin 3300 units IV X 1 bolus and increase drip rate to 1700 units/hr.  Will recheck HL 6 hrs after rate change.    Daniell Paradise D 07/05/2018,11:18 PM

## 2018-07-05 NOTE — ED Notes (Signed)
Heparin drip and bolus verified with Jeannett Senior, RN

## 2018-07-05 NOTE — ED Triage Notes (Signed)
Pt to ED via POV c/o mid sternal chest pain that started 2 days ago. Pt states that the pain radiates into his neck and his right shoulder. Pt states that he has been having some shortness of breath but denies any other symptoms. Pt states that the pain is worse today than it has been the past 2 days. Pt states that he thought he was bruised from falling on Wednesday. Pt reports passing out from being "overheated" when mowing the grass. Pt is in NAD at this time.

## 2018-07-05 NOTE — H&P (Signed)
[facility] [place] at [address]   PATIENT NAME: Kurt Kelley    MR#:  [id number]  DATE OF BIRTH:  [birth year]  DATE OF ADMISSION:  07/05/2018  PRIMARY CARE PHYSICIAN: Jerl Mina, MD   REQUESTING/REFERRING PHYSICIAN: dr Don Perking  CHIEF COMPLAINT:   Chest pain on and off since yesterday. HISTORY OF PRESENT ILLNESS:  Kurt Kelley  is a 73 y.o. male with a known history of ischemic cardiomyopathy, chronic systolic mild congestive heart failure, coronary artery disease status post stent times one in 2003, peripheral vascular disease and sleep apnea comes to the emergency room after he started having chest pain which was progressively increasing. Patient said he mowed his lawn two days ago felt very exhausted became diaphoretic started having chest pain took it easy however since then his chest pain has been progressively worsening to the point he became very diaphoretic and blood pressure was elevated in the 200s according to the wife when taken at home  Came to the emergency room was found to be hypertensive along with chest pain and troponin is .09. Patient is getting IV heparin drip and Nitro drip is being started. He is otherwise hemodynamically stable during my evaluation his chest pain free.  ER physician called and spoke with Dr. Kirke Corin for possible cardiac catheterization. Dr. Kirke Corin seems to be working on the plan for it.  PAST MEDICAL HISTORY:   Past Medical History:  Diagnosis Date  . Aortic valve stenosis - mild    TTE 10/08/17  . Arthritis    lower back, right knee  . Heart murmur   . Hypertension   . Sleep apnea     PAST SURGICAL HISTOIRY:   Past Surgical History:  Procedure Laterality Date  . CARDIAC CATHETERIZATION  2004   stent  . CARPAL TUNNEL RELEASE Right 10/24/2017   Procedure: CARPAL TUNNEL RELEASE ENDOSCOPIC;  Surgeon: Christena Flake, MD;  Location: [facility];  Service: Orthopedics;  Laterality: Right;  sleep apnea  .  CERVICAL SPINE SURGERY Right 01/23/2017   arthrodesis, discectomy, osteophytectomy, decompression  C3-C4, [facility]  . COLONOSCOPY    . KNEE SURGERY Right 1966  . REPLACEMENT TOTAL KNEE Left 2005    SOCIAL HISTORY:   Social History   Tobacco Use  . Smoking status: Former Smoker    Last attempt to quit: 1988    Years since quitting: 31.5  . Smokeless tobacco: Never Used  Substance Use Topics  . Alcohol use: Yes    Alcohol/week: 6.6 oz    Types: 10 Cans of beer, 1 Shots of liquor per week    FAMILY HISTORY:  No family history on file.  DRUG ALLERGIES:   Allergies  Allergen Reactions  . Ampicillin Itching  . Capsicum Swelling    Paprika and black pepper both cause swelling of the lips    REVIEW OF SYSTEMS:  Review of Systems  Constitutional: Negative for chills, fever and weight loss.  HENT: Negative for ear discharge, ear pain and nosebleeds.   Eyes: Negative for blurred vision, pain and discharge.  Respiratory: Negative for sputum production, shortness of breath, wheezing and stridor.   Cardiovascular: Positive for chest pain. Negative for palpitations, orthopnea and PND.  Gastrointestinal: Negative for abdominal pain, diarrhea, nausea and vomiting.  Genitourinary: Negative for frequency and urgency.  Musculoskeletal: Negative for back pain and joint pain.  Neurological: Negative for sensory change, speech change, focal weakness and weakness.  Psychiatric/Behavioral: Negative for depression and hallucinations. The patient is nervous/anxious.  MEDICATIONS AT HOME:   Prior to Admission medications   Medication Sig Start Date End Date Taking? Authorizing Provider  albuterol (PROVENTIL HFA;VENTOLIN HFA) 108 (90 Base) MCG/ACT inhaler Inhale into the lungs every 6 (six) hours as needed for wheezing or shortness of breath.   Yes [provider]  aspirin 81 MG tablet Take 81 mg by mouth daily.   Yes [provider]  carvedilol (COREG) 3.125 MG tablet Take  1 tablet (3.125 mg total) by mouth 2 (two) times daily. 05/14/18 08/12/18 Yes Iran Ouch, MD  chlorthalidone (HYGROTON) 25 MG tablet Take 25 mg by mouth daily. 05/16/18  Yes [provider]  doxazosin (CARDURA) 2 MG tablet Take 2 mg by mouth daily.    Yes [provider]  Evolocumab (REPATHA SURECLICK) 140 MG/ML SOAJ Inject 1 pen into the skin every 14 (fourteen) days. 04/24/18  Yes Iran Ouch, MD  fenofibrate (TRICOR) 145 MG tablet Take 145 mg by mouth daily.    Yes [provider]  finasteride (PROSCAR) 5 MG tablet Take 5 mg by mouth daily.    Yes [provider]  ibuprofen (ADVIL,MOTRIN) 800 MG tablet Take 800 mg by mouth daily as needed.   Yes [provider]  losartan (COZAAR) 100 MG tablet Take 100 mg by mouth daily.    Yes [provider]  senna-docusate (SENOKOT S) 8.6-50 MG tablet Take 1 tablet by mouth 2 (two) times daily as needed for mild constipation.   Yes [provider]  tamsulosin (FLOMAX) 0.4 MG CAPS capsule Take 1 capsule by mouth daily. 05/27/18  Yes [provider]      VITAL SIGNS:  Blood pressure 135/73, pulse 67, temperature 97.7 F (36.5 C), temperature source Oral, resp. rate 16, height 6\' 2"  (1.88 m), weight 127 kg (280 lb), SpO2 95 %.  PHYSICAL EXAMINATION:  GENERAL:  71 y.o.-year-old patient lying in the bed with no acute distress. obese EYES: Pupils equal, round, reactive to light and accommodation. No scleral icterus. Extraocular muscles intact.  HEENT: Head atraumatic, normocephalic. Oropharynx and nasopharynx clear.  NECK:  Supple, no jugular venous distention. No thyroid enlargement, no tenderness.  LUNGS: Normal breath sounds bilaterally, no wheezing, rales,rhonchi or crepitation. No use of accessory muscles of respiration.  CARDIOVASCULAR: S1, S2 normal. No murmurs, rubs, or gallops.  ABDOMEN: Soft, nontender, nondistended. Bowel sounds present. No organomegaly or mass.   EXTREMITIES: No pedal edema, cyanosis, or clubbing.  NEUROLOGIC: Cranial nerves II through XII are intact. Muscle strength 5/5 in all extremities. Sensation intact. Gait not checked.  PSYCHIATRIC: The patient is alert and oriented x 3.  SKIN: No obvious rash, lesion, or ulcer.   LABORATORY PANEL:   CBC Recent Labs  Lab 07/05/18 1241  WBC 7.2  HGB 17.0  HCT 49.5  PLT 179   ------------------------------------------------------------------------------------------------------------------  Chemistries  Recent Labs  Lab 07/05/18 1241  NA 137  K 3.9  CL 106  CO2 21*  GLUCOSE 281*  BUN 20  CREATININE 1.21  CALCIUM 9.9   ------------------------------------------------------------------------------------------------------------------  Cardiac Enzymes Recent Labs  Lab 07/05/18 1241  TROPONINI 0.09*   ------------------------------------------------------------------------------------------------------------------  RADIOLOGY:  Dg Chest 2 View  Result Date: 07/05/2018 CLINICAL DATA:  72 year old male with history of midsternal pain starting 2 days ago with radiation into the neck in the right shoulder. Some associated shortness of breath. Pain worsening over the past 2 days. EXAM: CHEST - 2 VIEW COMPARISON:  Chest x-ray 11/23/2004. FINDINGS: Lung volumes are normal. No consolidative  airspace disease. No pleural effusions. No pneumothorax. No pulmonary nodule or mass noted. Pulmonary vasculature and the cardiomediastinal silhouette are within normal limits. IMPRESSION: No radiographic evidence of acute cardiopulmonary disease. Electronically Signed   By: Trudie Reed M.D.   On: 07/05/2018 12:56    EKG:    IMPRESSION AND PLAN:   Kurt Kelley  is a 72 y.o. male with a known history of ischemic cardiomyopathy, chronic systolic mild congestive heart failure, coronary artery disease status post stent times one in 2003, peripheral vascular disease and sleep apnea comes to the  emergency room after he started having chest pain which was progressively increasing. Patient said he mowed his lawn two days ago felt very exhausted became diaphoretic started having chest pain  1. acute NST EMI/unstable angina patient with known coronary artery disease status post stent in 2003 -factors with hypertension, ischemic cardiomyopathy, hyperlipidemia intolerant to statins and diabetesalong with sleep apnea -to telemetry floor -cycle cardiac enzymes times three -IV Nitro, IV heparin, continue cardiac meds including aspirin Plavix -cardiology consultation with Dr. Kirke Corin-- await further recommendations  2. uncontrolled type II diabetes -sliding scale insulin  3. Malignant hypertension -pressure much improved after meds given in the ER. Patient is will be started on Nitro drip. -Continue cardiac meds  4. Hyperlipidemia intolerant to statins  5. Morbid obesity with obstructive sleep apnea  6. DVT prophylaxis already on IV heparin drip  Above was discussed with patient and patient's wife   All the records are reviewed and case discussed with ED provider. Management plans discussed with the patient, family and they are in agreement.  CODE STATUS: DNR  TOTAL TIME TAKING CARE OF THIS PATIENT: *55* minutes.    Enedina Finner M.D on 07/05/2018 at 3:07 PM  Between 7am to 6pm - Pager - [phone]  After 6pm go to [email or web address] - password [email or web address]  [facility]  Office  [phone]  CC: Primary care physician; Jerl Mina, MD

## 2018-07-05 NOTE — ED Notes (Signed)
After 3rd dose of SL pain is completely resolved, BP 120/87.

## 2018-07-05 NOTE — Telephone Encounter (Signed)
Reviewing the patient message to call the patient back. The patient is currently in the ER for chest pain and evaluation for a N-STEMI.

## 2018-07-05 NOTE — ED Provider Notes (Signed)
[facility] Department Provider Note  ____________________________________________  Time seen: Approximately 2:02 PM  I have reviewed the triage vital signs and the nursing notes.   HISTORY  Chief Complaint Chest Pain   HPI Kurt Kelley is a 72 y.o. male with a history of CAD status post stent on aspirin, aortic valve stenosis, hypertension, OSA who presents for evaluation of chest pain.  Patient reports that he has been having intermittent chest pain for the last 2 days.  The pain started while he was mowing the lawn 2 days ago.  The pain at that time was severe and led to a syncopal episode.  Patient reports that the pain is a tightness, located in the center of his chest, radiating up to his throat and chin and his right arm associated with diaphoresis, nausea, dizziness.  The pain has been intermittent for the last 2 days.  At this time 5 out of 10.  Pain is identical to prior heart attack.  Past Medical History:  Diagnosis Date  . Aortic valve stenosis - mild    TTE 10/08/17  . Arthritis    lower back, right knee  . Heart murmur   . Hypertension   . Sleep apnea      Past Surgical History:  Procedure Laterality Date  . CARDIAC CATHETERIZATION  2004   stent  . CARPAL TUNNEL RELEASE Right 10/24/2017   Procedure: CARPAL TUNNEL RELEASE ENDOSCOPIC;  Surgeon: Christena Flake, MD;  Location: [facility];  Service: Orthopedics;  Laterality: Right;  sleep apnea  . CERVICAL SPINE SURGERY Right 01/23/2017   arthrodesis, discectomy, osteophytectomy, decompression  C3-C4, [facility]  . COLONOSCOPY    . KNEE SURGERY Right 1966  . REPLACEMENT TOTAL KNEE Left 2005    Prior to Admission medications   Medication Sig Start Date End Date Taking? Authorizing Provider  albuterol (PROVENTIL HFA;VENTOLIN HFA) 108 (90 Base) MCG/ACT inhaler Inhale into the lungs every 6 (six) hours as needed for wheezing or shortness of breath.   Yes [provider]  aspirin 81 MG tablet Take 81 mg by mouth daily.   Yes [provider]  carvedilol (COREG) 3.125 MG tablet Take 1 tablet (3.125 mg total) by mouth 2 (two) times daily. 05/14/18 08/12/18 Yes Iran Ouch, MD  chlorthalidone (HYGROTON) 25 MG tablet Take 25 mg by mouth daily. 05/16/18  Yes [provider]  doxazosin (CARDURA) 2 MG tablet Take 2 mg by mouth daily.    Yes [provider]  Evolocumab (REPATHA SURECLICK) 140 MG/ML SOAJ Inject 1 pen into the skin every 14 (fourteen) days. 04/24/18  Yes Iran Ouch, MD  fenofibrate (TRICOR) 145 MG tablet Take 145 mg by mouth daily.    Yes [provider]  finasteride (PROSCAR) 5 MG tablet Take 5 mg by mouth daily.    Yes [provider]  ibuprofen (ADVIL,MOTRIN) 800 MG tablet Take 800 mg by mouth daily as needed.   Yes [provider]  losartan (COZAAR) 100 MG tablet Take 100 mg by mouth daily.    Yes [provider]  senna-docusate (SENOKOT S) 8.6-50 MG tablet Take 1 tablet by mouth 2 (two) times daily as needed for mild constipation.   Yes [provider]  tamsulosin (FLOMAX) 0.4 MG CAPS capsule Take 1 capsule by mouth daily. 05/27/18  Yes [provider]    Allergies Ampicillin and Capsicum  No family history on file.  Social History Social History  Tobacco Use  . Smoking status: Former Smoker    Last attempt to quit: 1988    Years since quitting: 31.5  . Smokeless tobacco: Never Used  Substance Use Topics  . Alcohol use: Yes    Alcohol/week: 6.6 oz    Types: 10 Cans of beer, 1 Shots of liquor per week  . Drug use: Not on file    Review of Systems  Constitutional: Negative for fever. Eyes: Negative for visual changes. ENT: Negative for sore throat. Neck: No neck pain  Cardiovascular: +  chest pain, diaphoresis Respiratory: + shortness of breath. Gastrointestinal: Negative for abdominal pain, vomiting or diarrhea. Genitourinary: Negative for  dysuria. Musculoskeletal: Negative for back pain. Skin: Negative for rash. Neurological: Negative for headaches, weakness or numbness. Psych: No SI or HI  ____________________________________________   PHYSICAL EXAM:  VITAL SIGNS: ED Triage Vitals  Enc Vitals Group     BP 07/05/18 1236 (!) 205/80     Pulse Rate 07/05/18 1236 88     Resp 07/05/18 1236 16     Temp 07/05/18 1236 97.7 F (36.5 C)     Temp Source 07/05/18 1236 Oral     SpO2 07/05/18 1236 96 %     Weight 07/05/18 1235 280 lb (127 kg)     Height 07/05/18 1235 6\' 2"  (1.88 m)     Head Circumference --      Peak Flow --      Pain Score 07/05/18 1234 8     Pain Loc --      Pain Edu? --      Excl. in GC? --     Constitutional: Alert and oriented. Well appearing and in no apparent distress. HEENT:      Head: Normocephalic and atraumatic.         Eyes: Conjunctivae are normal. Sclera is non-icteric.       Mouth/Throat: Mucous membranes are moist.       Neck: Supple with no signs of meningismus. Cardiovascular: Regular rate and rhythm. II/VI systolic murmur loudest at the RUSB. No gallops, or rubs. 2+ symmetrical distal pulses are present in all extremities. No JVD. Respiratory: Normal respiratory effort. Lungs are clear to auscultation bilaterally. No wheezes, crackles, or rhonchi.  Gastrointestinal: Soft, non tender, and non distended with positive bowel sounds. No rebound or guarding. Genitourinary: No CVA tenderness. Musculoskeletal: Nontender with normal range of motion in all extremities. No edema, cyanosis, or erythema of extremities. Neurologic: Normal speech and language. Face is symmetric. Moving all extremities. No gross focal neurologic deficits are appreciated. Skin: Skin is warm, dry and intact. No rash noted. Psychiatric: Mood and affect are normal. Speech and behavior are normal.  ____________________________________________   LABS (all labs ordered are listed, but only abnormal results are  displayed)  Labs Reviewed  BASIC METABOLIC PANEL - Abnormal; Notable for the following components:      Result Value   CO2 21 (*)    Glucose, Bld 281 (*)    GFR calc non Af Amer 59 (*)    All other components within normal limits  CBC - Abnormal; Notable for the following components:   RBC 5.93 (*)    All other components within normal limits  TROPONIN I - Abnormal; Notable for the following components:   Troponin I 0.09 (*)    All other components within normal limits  APTT  PROTIME-INR   ____________________________________________  EKG  ED ECG REPORT I, Nita Sickle, the attending physician, personally viewed and interpreted  this ECG.  Normal sinus rhythm, rate of 86, normal intervals, normal axis, slight ST depressions on inferior leads, no ST elevation.  13:54 -normal sinus rhythm, rate of 77, normal intervals, normal axis, persistent ST depressions on inferior leads with no ST elevation. ____________________________________________  RADIOLOGY  I have personally reviewed the images performed during this visit and I agree with the Radiologist's read.   Interpretation by Radiologist:  Dg Chest 2 View  Result Date: 07/05/2018 CLINICAL DATA:  72 year old male with history of midsternal pain starting 2 days ago with radiation into the neck in the right shoulder. Some associated shortness of breath. Pain worsening over the past 2 days. EXAM: CHEST - 2 VIEW COMPARISON:  Chest x-ray 11/23/2004. FINDINGS: Lung volumes are normal. No consolidative airspace disease. No pleural effusions. No pneumothorax. No pulmonary nodule or mass noted. Pulmonary vasculature and the cardiomediastinal silhouette are within normal limits. IMPRESSION: No radiographic evidence of acute cardiopulmonary disease. Electronically Signed   By: Trudie Reed M.D.   On: 07/05/2018 12:56     ____________________________________________   PROCEDURES  Procedure(s) performed:  None Procedures Critical Care performed:  Yes  CRITICAL CARE Performed by: Nita Sickle  ?  Total critical care time: 35 min  Critical care time was exclusive of separately billable procedures and treating other patients.  Critical care was necessary to treat or prevent imminent or life-threatening deterioration.  Critical care was time spent personally by me on the following activities: development of treatment plan with patient and/or surrogate as well as nursing, discussions with consultants, evaluation of patient's response to treatment, examination of patient, obtaining history from patient or surrogate, ordering and performing treatments and interventions, ordering and review of laboratory studies, ordering and review of radiographic studies, pulse oximetry and re-evaluation of patient's condition.  ____________________________________________   INITIAL IMPRESSION / ASSESSMENT AND PLAN / ED COURSE   72 y.o. male with a history of CAD status post stent on aspirin, aortic valve stenosis, hypertension, OSA who presents for evaluation of chest pain x2 days concerning for ACS.  EKG is showing ST depressions in inferior leads but does not meet STEMI criteria.  Repeat EKG was unchanged from initial.  First troponin 0 0.09.  After 3 sublingual nitros patient is now pain-free.  Blood pressure is now improved as well.  Will start patient on heparin.  Discussed with Dr. Mariah Milling possible need for urgent catheterization. He recommended admission to Hospitalist on heparin and he will contact cardiology team for cath.      As part of my medical decision making, I reviewed the following data within the electronic MEDICAL RECORD NUMBER Nursing notes reviewed and incorporated, Labs reviewed , EKG interpreted , Old EKG reviewed, Old chart reviewed, Radiograph reviewed , Discussed with admitting physician , A consult was requested and obtained from this/these consultant(s) Cardiology, Notes from prior ED  visits and [place] Controlled Substance Database    Pertinent labs & imaging results that were available during my care of the patient were reviewed by me and considered in my medical decision making (see chart for details).    ____________________________________________   FINAL CLINICAL IMPRESSION(S) / ED DIAGNOSES  Final diagnoses:  NSTEMI (non-ST elevated myocardial infarction) (HCC)      NEW MEDICATIONS STARTED DURING THIS VISIT:  ED Discharge Orders    None       Note:  This document was prepared using Dragon voice recognition software and may include unintentional dictation errors.    Don Perking, [place], MD 07/05/18 1426

## 2018-07-05 NOTE — Consult Note (Signed)
ANTICOAGULATION CONSULT NOTE - Initial Consult  Pharmacy Consult for Heparin Drip  Indication: chest pain/ACS  Allergies  Allergen Reactions  . Ampicillin Itching  . Capsicum Swelling    Paprika and black pepper both cause swelling of the lips    Patient Measurements: Height: 6\' 2"  (188 cm) Weight: 280 lb (127 kg) IBW/kg (Calculated) : 82.2 Heparin Dosing Weight: 110 kg  Vital Signs: Temp: 97.7 F (36.5 C) (07/12 1236) Temp Source: Oral (07/12 1236) BP: 205/80 (07/12 1236) Pulse Rate: 88 (07/12 1236)  Labs: Recent Labs    07/05/18 1241  HGB 17.0  HCT 49.5  PLT 179  CREATININE 1.21  TROPONINI 0.09*    Estimated Creatinine Clearance: 80.4 mL/min (by C-G formula based on SCr of 1.21 mg/dL).   Medical History: Past Medical History:  Diagnosis Date  . Aortic valve stenosis - mild    TTE 10/08/17  . Arthritis    lower back, right knee  . Heart murmur   . Hypertension   . Sleep apnea     Assessment: Pharmacy consulted for heparin drip dosing and monitoring for ACS/Chest Pain in 72yo male. No anticoagulants reported PTA.   Goal of Therapy:  Heparin level 0.3-0.7 units/ml Monitor platelets by anticoagulation protocol: Yes   Plan:  Baseline labs ordered Dosing Weight: 110kg Give 4000 units bolus x 1 Start heparin infusion at 1300 units/hr Check anti-Xa level in 8 hours and daily while on heparin Continue to monitor H&H and platelets  Gardner Candle, PharmD, BCPS Clinical Pharmacist 07/05/2018 2:19 PM

## 2018-07-05 NOTE — ED Notes (Signed)
Pt presents to ED with c/o substernal CP x 2 days. Pt states syncopal episode on Wednesday after getting too hot. Pt states for last 2 days pain has been intermittent, worse today without relief. Pt states pain is substernal in nature that radiates to R shoulder and neck, describes as sharp/tightness/pressure, pt states pain worse with palpation. Pt states hx of MI with stent and is being followed by cardiologist at this time. Pt denies numbness/tingling in his arms and legs at this time. Pt is alert and oriented, able to speak in complete sentences at this time.

## 2018-07-05 NOTE — Telephone Encounter (Signed)
Patient wife calling States patient was outside mowing yesterday and passed out after getting too hot States his blood pressure has been also high Would like to discuss symptoms with nurse to see what is suggested Please call

## 2018-07-05 NOTE — ED Notes (Signed)
Eula Listen, PA-C at bedside with pt

## 2018-07-05 NOTE — Progress Notes (Addendum)
Family Meeting Note  Advance Directive:yes  Today's meeting took place in the ER with patient and wife  It comes in with chest pain on and off since two days along with malignant hypertension. He is uncontrolled diabetes hyperlipidemia and coronary artery disease with stent in the past. He is being admitted with unstable angina/non-STEMI code status discussed with patient in presence of wife. Patient states he is a Full code  Total Time 17 minutes

## 2018-07-05 NOTE — Consult Note (Signed)
Cardiology Consultation:   Patient ID: AADAN CHENIER; [id number]; 13-Dec-1946   Admit date: 07/05/2018 Date of Consult: 07/05/2018  Primary Care Provider: Jerl Mina, MD Primary Cardiologist: Kirke Corin   Patient Profile:   Kurt Kelley is a 72 y.o. 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 Allena Katz.  History of Present Illness:   Kurt Kelley most recent nuclear stress test in 09/2017 at [facility] showed normal perfusion with an EF of 39%. Echo in 09/2017 done at [facility] showed an EF of 40-45%, mild aortic stenosis with a reported valve area of 1.2 and moderate mitral regurgitation. Most recent echo performed on 04/25/2018 showed an improved EF to 50-55%, no RWMA, Gr1DD, mild to moderate aortic stenosis with mild aortic insufficiency, mildly dilated left atrium, RVSF normal, PASP mildly elevated.   Patient was as a usual state of health until 7/10.  Patient spent the majority of the afternoon mowing his lawn (with a riding mower).  This was followed by trimming some limbs, weed eating, and pulling weeds.  Following this, the patient became extremely fatigued and diaphoretic.  He drove his lawnmower underneath his shed and stood up to get off the lawnmower.  This was followed by an unwitnessed syncopal episode.  Patient is uncertain how long he was without consciousness.  There was no loss of bowel or bladder function.  There was no preceding chest pain or palpitations.  Upon coming to, the patient was asymptomatic outside of increased fatigue.  He proceeded to go on with his evening on 7/10, even going to a  family cookout.  Patient reported while at the cookout he did not feel well though denied any chest pain.  He cannot further describe how he was feeling.  On 7/11, the patient continued to note increased fatigue along with the development of substernal chest pain that radiated up to his neck and jaw.  This pain progressively got worse as 7/11 went on.  By the evening hours on 7/11 while he was trying to sleep his pain became a 9 out of 10.  Pain was rated as a severe pressure.  Pain was similar to his prior MI in 2003 though much more severe.  Patient checked his blood pressure at home and was found to be in the 200s this morning.  This prompted him to come to [facility] for further evaluation.  Upon his arrival to [facility] ED he was noted to be hypertensive with a BP of 205/80, heart rate 88 bpm, oxygen saturation 96% on room air, weight 280 pounds. EKG showed NSR, 86 bpm, no acute st/t changes. CXR showed no acute cardiopulmonary process. Initial troponin 0.09, WBC 7.2, HGB 17.0, PLT 179, Na 137, K+ 3.9, glucose 281, SCr 1.21. In the ED he was given ASA 324 mg x 1 along with SL NTG x 3 and placed on heparin and nitro gtts. He is currently chest pain free.   Past Medical History:  Diagnosis Date  . Aortic stenosis    a. EF to 50-55%, no RWMA, Gr1DD, mild to moderate aortic stenosis with mild aortic insufficiency, mildly dilated left  atrium, RVSF normal, PASP mildly elevated  . Arthritis    lower back, right knee  . CAD (coronary artery disease)    a. remote PCI in 2003; b. Myoview 10/18 no ischemia, EF 39%  . HLD (hyperlipidemia)    a. intolerant to statins  . Hypertension   . Ischemic cardiomyopathy    a. EF to 50-55%, no RWMA, Gr1DD, mild to moderate aortic stenosis with mild aortic insufficiency, mildly dilated left atrium, RVSF normal, PASP mildly elevated  . PAD (peripheral artery disease) (HCC)   . Sleep apnea    a. intolerant of CPAP    Past Surgical History:  Procedure Laterality Date  .  CARDIAC CATHETERIZATION  2004   stent  . CARPAL TUNNEL RELEASE Right 10/24/2017   Procedure: CARPAL TUNNEL RELEASE ENDOSCOPIC;  Surgeon: Christena Flake, MD;  Location: [facility];  Service: Orthopedics;  Laterality: Right;  sleep apnea  . CERVICAL SPINE SURGERY Right 01/23/2017   arthrodesis, discectomy, osteophytectomy, decompression  C3-C4, [facility]  . COLONOSCOPY    . KNEE SURGERY Right 1966  . REPLACEMENT TOTAL KNEE Left 2005     Home Meds: Prior to Admission medications   Medication Sig Start Date End Date Taking? Authorizing Provider  albuterol (PROVENTIL HFA;VENTOLIN HFA) 108 (90 Base) MCG/ACT inhaler Inhale into the lungs every 6 (six) hours as needed for wheezing or shortness of breath.   Yes [provider]  aspirin 81 MG tablet Take 81 mg by mouth daily.   Yes [provider]  carvedilol (COREG) 3.125 MG tablet Take 1 tablet (3.125 mg total) by mouth 2 (two) times daily. 05/14/18 08/12/18 Yes Iran Ouch, MD  chlorthalidone (HYGROTON) 25 MG tablet Take 25 mg by mouth daily. 05/16/18  Yes [provider]  doxazosin (CARDURA) 2 MG tablet Take 2 mg by mouth daily.    Yes [provider]  Evolocumab (REPATHA SURECLICK) 140 MG/ML SOAJ Inject 1 pen into the skin every 14 (fourteen) days. 04/24/18  Yes Iran Ouch, MD  fenofibrate (TRICOR) 145 MG tablet Take 145 mg by mouth daily.    Yes [provider]  finasteride (PROSCAR) 5 MG tablet Take 5 mg by mouth daily.    Yes [provider]  ibuprofen (ADVIL,MOTRIN) 800 MG tablet Take 800 mg by mouth daily as needed.   Yes [provider]  losartan (COZAAR) 100 MG tablet Take 100 mg by mouth daily.    Yes [provider]  senna-docusate (SENOKOT S) 8.6-50 MG tablet Take 1 tablet by mouth 2 (two) times daily as needed for mild constipation.   Yes [provider]  tamsulosin (FLOMAX) 0.4 MG CAPS capsule Take 1 capsule by mouth daily. 05/27/18  Yes  [provider]    Inpatient Medications: Scheduled Meds: . insulin aspart  0-9 Units Subcutaneous TID WC   Continuous Infusions: . heparin 1,300 Units/hr (07/05/18 1442)   PRN Meds: nitroGLYCERIN  Allergies:   Allergies  Allergen Reactions  . Ampicillin Itching  . Capsicum Swelling    Paprika and black pepper both cause swelling of the lips    Social History:   Social History   Socioeconomic History  . Marital status: Married    Spouse name: Not on file  . Number of children: Not on file  . Years of education: Not on file  . Highest education level: Not on file  Occupational History  . Not on file  Social Needs  . Financial resource strain: Not on file  .  Food insecurity:    Worry: Not on file    Inability: Not on file  . Transportation needs:    Medical: Not on file    Non-medical: Not on file  Tobacco Use  . Smoking status: Former Smoker    Last attempt to quit: 1988    Years since quitting: 31.5  . Smokeless tobacco: Never Used  Substance and Sexual Activity  . Alcohol use: Yes    Alcohol/week: 6.6 oz    Types: 10 Cans of beer, 1 Shots of liquor per week  . Drug use: Not on file  . Sexual activity: Not on file  Lifestyle  . Physical activity:    Days per week: Not on file    Minutes per session: Not on file  . Stress: Not on file  Relationships  . Social connections:    Talks on phone: Not on file    Gets together: Not on file    Attends religious service: Not on file    Active member of club or organization: Not on file    Attends meetings of clubs or organizations: Not on file    Relationship status: Not on file  . Intimate partner violence:    Fear of current or ex partner: Not on file    Emotionally abused: Not on file    Physically abused: Not on file    Forced sexual activity: Not on file  Other Topics Concern  . Not on file  Social History Narrative  . Not on file     Family History:   Family History  Problem Relation  Age of Onset  . CAD Father   . Lung cancer Father   . Kidney cancer Father   . Diabetes Mother   . Alzheimer's disease Mother     ROS:  Review of Systems  Constitutional: Positive for malaise/fatigue. Negative for chills, diaphoresis, fever and weight loss.  HENT: Negative for congestion.   Eyes: Negative for discharge and redness.  Respiratory: Positive for shortness of breath. Negative for cough, hemoptysis, sputum production and wheezing.   Cardiovascular: Positive for chest pain. Negative for palpitations, orthopnea, claudication, leg swelling and PND.  Gastrointestinal: Positive for nausea and vomiting. Negative for abdominal pain, blood in stool, heartburn and melena.  Genitourinary: Negative for hematuria.  Musculoskeletal: Negative for falls and myalgias.  Skin: Negative for rash.  Neurological: Positive for loss of consciousness and weakness. Negative for dizziness, tingling, tremors, sensory change, speech change, focal weakness, seizures and headaches.  Endo/Heme/Allergies: Does not bruise/bleed easily.  Psychiatric/Behavioral: Negative for substance abuse. The patient is not nervous/anxious.   All other systems reviewed and are negative.     Physical Exam/Data:   Vitals:   07/05/18 1548 07/05/18 1551 07/05/18 1555 07/05/18 1621  BP: 131/70 (!) 84/32 126/77 (!) 158/91  Pulse:    66  Resp: 16 11 16 18   Temp:    (!) 97.5 F (36.4 C)  TempSrc:    Oral  SpO2:    97%  Weight:      Height:       No intake or output data in the 24 hours ending 07/05/18 1624 Filed Weights   07/05/18 1235  Weight: 280 lb (127 kg)   Body mass index is 35.95 kg/m.   Physical Exam: General: Well developed, well nourished, in no acute distress. Head: Normocephalic, atraumatic, sclera non-icteric, no xanthomas, nares without discharge.  Neck: Negative for carotid bruits. JVD not elevated. Lungs: Clear bilaterally to auscultation without  wheezes, rales, or rhonchi. Breathing is  unlabored. Heart: RRR with S1 S2. II/VI systolic murmur RUSB, rubs, or gallops appreciated. Abdomen: Soft, non-tender, non-distended with normoactive bowel sounds. No hepatomegaly. No rebound/guarding. No obvious abdominal masses. Msk:  Strength and tone appear normal for age. Extremities: No clubbing or cyanosis. No edema. Distal pedal pulses are 2+ and equal bilaterally. Neuro: Alert and oriented X 3. No facial asymmetry. No focal deficit. Moves all extremities spontaneously. Psych:  Responds to questions appropriately with a normal affect.   EKG:  The EKG was personally reviewed and demonstrates: NSR, 86 bpm, no acute st/t changes Telemetry:  Telemetry was personally reviewed and demonstrates: NSR  Weights: Filed Weights   07/05/18 1235  Weight: 280 lb (127 kg)    Relevant CV Studies: Echo 04/2018: Study Conclusions  - Left ventricle: The cavity size was normal. Systolic function was   normal. The estimated ejection fraction was in the range of 50%   to 55%. Wall motion was normal; there were no regional wall   motion abnormalities. Doppler parameters are consistent with   abnormal left ventricular relaxation (grade 1 diastolic   dysfunction). - Aortic valve: There was mild to moderate stenosis. There was mild   regurgitation. Peak velocity (S): 342 cm/s. Mean gradient (S): 24   mm Hg. - Left atrium: The atrium was mildly dilated. - Right ventricle: Systolic function was normal. - Pulmonary arteries: Systolic pressure was mildly elevated.   Myoview 09/2017: LVEF= 39%  FINDINGS: Regional wall motion:reveals normal myocardial thickening and wall  motion. The overall quality of the study is fair. Artifacts noted: yes Left ventricular cavity: normal.  Perfusion Analysis:SPECT images demonstrate homogeneous tracer  distribution throughout the myocardium.  Laboratory Data:  Chemistry Recent Labs  Lab 07/05/18 1241  NA 137  K 3.9  CL 106  CO2 21*    GLUCOSE 281*  BUN 20  CREATININE 1.21  CALCIUM 9.9  GFRNONAA 59*  GFRAA >60  ANIONGAP 10    No results for input(s): PROT, ALBUMIN, AST, ALT, ALKPHOS, BILITOT in the last 168 hours. Hematology Recent Labs  Lab 07/05/18 1241  WBC 7.2  RBC 5.93*  HGB 17.0  HCT 49.5  MCV 83.5  MCH 28.8  MCHC 34.4  RDW 14.2  PLT 179   Cardiac Enzymes Recent Labs  Lab 07/05/18 1241  TROPONINI 0.09*   No results for input(s): TROPIPOC in the last 168 hours.  BNPNo results for input(s): BNP, PROBNP in the last 168 hours.  DDimer No results for input(s): DDIMER in the last 168 hours.  Radiology/Studies:  Dg Chest 2 View  Result Date: 07/05/2018 IMPRESSION: No radiographic evidence of acute cardiopulmonary disease. Electronically Signed   By: Trudie Reed M.D.   On: 07/05/2018 12:56    Assessment and Plan:   1.  Non-STEMI/CAD: -Currently chest pain-free -Initial troponin elevated at 0.09, continue to cycle until peak -ASA -Heparin drip -Nitro drip -Patient became hypotensive with nitro drip leading to a decreased dose with subsequent stabilization of blood pressure -Check echocardiogram -Given that the patient is currently chest pain-free we will continue to cycle troponin however, if the patient redevelops symptoms concerning for angina or if there is dynamic elevation in his troponin we would need to proceed with emergent cardiac catheterization -If the patient remains symptom-free and if the troponin is relatively flat trending we would plan to medically managed with heparin and proceed with cardiac catheterization on 7/15 -Patient symptoms are quite concerning for angina and he does  not want a stress test -Risks and benefits of cardiac catheterization have been discussed with the patient including risks of bleeding, bruising, infection, kidney damage, stroke, heart attack, and death. The patient understands these risks and is willing to proceed with the procedure. All questions  have been answered and concerns listened to -Upon consulting on the patient it was noted he was a DNR however upon discussing this with him and his wife he actually does not want to be a DNR.  He just does not want a prolonged intubation or to "be a vegetable." -This was discussed with the patient and his wife in detail and they are in agreement that the patient would like to be a full code.  I discussed this with his internal medicine physician and his CODE STATUS has been changed to full at both the patient and his wife wish  2.  Ischemic cardiomyopathy: -He does not appear grossly volume overloaded at this time -Check echo as above -Continue carvedilol, losartan, and chlorthalidone  3.  Syncope: -Possibly vasovagal/dehydration -Given his angina concern for arrhythmia cannot be excluded, also with mild to moderate aortic stenosis -Monitor on telemetry -Ischemic evaluation as above prior to discharge -Consider outpatient cardiac monitoring  4.  Aortic stenosis: -Check echo as above -Evaluate with left heart catheterization as above  5.  Hyperlipidemia: -Intolerant to statins -Check lipid panel -Goal LDL less than 70 -Planning to start Repatha as an outpatient  6.  Accelerated hypertension: -Likely in the setting of the above -Much improved on nitro drip -Resume carvedilol, losartan, and chlorthalidone as blood pressure allows    For questions or updates, please contact [facility] Please consult [email or web address] for contact info under Cardiology/STEMI.   Signed, Eula Listen, PA-C [facility] Pager: [phone] 07/05/2018, 4:24 PM

## 2018-07-05 NOTE — ED Notes (Signed)
Pt unable to tolerate increase in nitro dose due to drop in blood pressure. Pt dose decreased to 23mcg/min.  Pt denies dizziness at this time. Eula Listen PA-C at bedside

## 2018-07-05 NOTE — Progress Notes (Signed)
Talked to Dr. Enedina Finner about patient's Nitro drip not as active order, patient still has 3/10 chest pain, reorder nitro drip to continue until chest pain free. Also asked about the insulin and CBG order as patient states that he is not diabetic, patient's serum glucose is 281 and CBG is 135 order to continue insulin and to check Ha1c to confirm. Patient was NPO, order to place a cardiac/carb modified diet. No other concern at the moment. RN will continue to monitor.

## 2018-07-05 NOTE — Progress Notes (Signed)
Patient ID: Kurt Kelley, male   DOB: [birth year], 72 y.o.   MRN: [id number] spoke with Dr. Mariah Milling. Since patient currently is chest pain free will continue to cycle cardiac enzymes. Continue Nitro and heparin.

## 2018-07-05 NOTE — ED Notes (Signed)
Brandy, RN called to transport pt to the floor.

## 2018-07-06 ENCOUNTER — Encounter: Payer: Self-pay | Admitting: Cardiology

## 2018-07-06 DIAGNOSIS — I2510 Atherosclerotic heart disease of native coronary artery without angina pectoris: Secondary | ICD-10-CM

## 2018-07-06 DIAGNOSIS — Z9861 Coronary angioplasty status: Secondary | ICD-10-CM

## 2018-07-06 DIAGNOSIS — I10 Essential (primary) hypertension: Secondary | ICD-10-CM | POA: Diagnosis present

## 2018-07-06 DIAGNOSIS — I350 Nonrheumatic aortic (valve) stenosis: Secondary | ICD-10-CM

## 2018-07-06 DIAGNOSIS — R55 Syncope and collapse: Secondary | ICD-10-CM | POA: Diagnosis present

## 2018-07-06 LAB — LIPID PANEL
Cholesterol: 124 mg/dL (ref 0–200)
HDL: 28 mg/dL — ABNORMAL LOW (ref >40)
LDL CALC: 28 mg/dL (ref 0–99)
Total CHOL/HDL Ratio: 4.4 RATIO
Triglycerides: 341 mg/dL — ABNORMAL HIGH (ref <150)
VLDL: 68 mg/dL — AB (ref 0–40)

## 2018-07-06 LAB — BASIC METABOLIC PANEL
ANION GAP: 8 (ref 5–15)
BUN: 16 mg/dL (ref 8–23)
CALCIUM: 9.1 mg/dL (ref 8.9–10.3)
CO2: 26 mmol/L (ref 22–32)
Chloride: 107 mmol/L (ref 98–111)
Creatinine, Ser: 1.03 mg/dL (ref 0.61–1.24)
GFR calc Af Amer: >60 mL/min (ref >60)
GFR calc non Af Amer: >60 mL/min (ref >60)
GLUCOSE: 145 mg/dL — AB (ref 70–99)
Potassium: 4.1 mmol/L (ref 3.5–5.1)
Sodium: 141 mmol/L (ref 135–145)

## 2018-07-06 LAB — GLUCOSE, CAPILLARY
GLUCOSE-CAPILLARY: 120 mg/dL — AB (ref 70–99)
Glucose-Capillary: 139 mg/dL — ABNORMAL HIGH (ref 70–99)
Glucose-Capillary: 177 mg/dL — ABNORMAL HIGH (ref 70–99)
Glucose-Capillary: 143 mg/dL — ABNORMAL HIGH (ref 70–99)

## 2018-07-06 LAB — HEPARIN LEVEL (UNFRACTIONATED)
HEPARIN UNFRACTIONATED: 0.26 [IU]/mL — AB (ref 0.30–0.70)
HEPARIN UNFRACTIONATED: 0.39 [IU]/mL (ref 0.30–0.70)
Heparin Unfractionated: 0.37 IU/mL (ref 0.30–0.70)

## 2018-07-06 LAB — CBC
HCT: 42 % (ref 40.0–52.0)
HEMOGLOBIN: 14.8 g/dL (ref 13.0–18.0)
MCH: 29.3 pg (ref 26.0–34.0)
MCHC: 35.3 g/dL (ref 32.0–36.0)
MCV: 83 fL (ref 80.0–100.0)
Platelets: 164 10*3/uL (ref 150–440)
RBC: 5.06 MIL/uL (ref 4.40–5.90)
RDW: 13.8 % (ref 11.5–14.5)
WBC: 9.4 10*3/uL (ref 3.8–10.6)

## 2018-07-06 LAB — TROPONIN I: Troponin I: 1.2 ng/mL (ref <0.03)

## 2018-07-06 MED ORDER — EVOLOCUMAB 140 MG/ML ~~LOC~~ SOAJ
1.0000 "pen " | SUBCUTANEOUS | Status: DC
  Administered 2018-07-06: 1 via SUBCUTANEOUS
  Filled 2018-07-06: qty 1

## 2018-07-06 MED ORDER — HEPARIN BOLUS VIA INFUSION
1600.0000 [IU] | Freq: Once | INTRAVENOUS | Status: AC
  Administered 2018-07-06: 1600 [IU] via INTRAVENOUS
  Filled 2018-07-06: qty 1600

## 2018-07-06 MED ORDER — ISOSORBIDE MONONITRATE ER 30 MG PO TB24
30.0000 mg | ORAL_TABLET | Freq: Every day | ORAL | Status: DC
  Administered 2018-07-06 – 2018-07-09 (×3): 30 mg via ORAL
  Filled 2018-07-06 (×3): qty 1

## 2018-07-06 NOTE — Plan of Care (Signed)
  Problem: Health Behavior/Discharge Planning: Goal: Ability to manage health-related needs will improve Outcome: Progressing Note:  Patient willing to wait for cardiac catheterization on Monday, if needed. Remains on a Heparin drip. Will continue to monitor. Jari Favre [facility]

## 2018-07-06 NOTE — Progress Notes (Signed)
ANTICOAGULATION CONSULT NOTE - Follow Up Consult  Pharmacy Consult for Heparin  Indication: chest pain/ACS  Allergies  Allergen Reactions  . Ampicillin Itching  . Capsicum Swelling    Paprika and black pepper both cause swelling of the lips    Patient Measurements: Height: 6\' 2"  (188 cm) Weight: 286 lb 1.6 oz (129.8 kg) IBW/kg (Calculated) : 82.2 Heparin Dosing Weight: 110 kg   Vital Signs: Temp: 97.7 F (36.5 C) (07/13 0435) Temp Source: Oral (07/13 0435) BP: 134/69 (07/13 0435) Pulse Rate: 69 (07/13 0435)  Labs: Recent Labs    07/05/18 1241 07/05/18 1407 07/05/18 1655 07/05/18 2244 07/06/18 0530  HGB 17.0  --   --   --  14.8  HCT 49.5  --   --   --  42.0  PLT 179  --   --   --  164  APTT  --  29  --   --   --   LABPROT  --  13.1  --   --   --   INR  --  1.00  --   --   --   HEPARINUNFRC  --   --   --  0.12* 0.26*  CREATININE 1.21  --   --   --   --   TROPONINI 0.09*  --  0.32* 1.04*  --     Estimated Creatinine Clearance: 81.3 mL/min (by C-G formula based on SCr of 1.21 mg/dL).   Medications:  Medications Prior to Admission  Medication Sig Dispense Refill Last Dose  . albuterol (PROVENTIL HFA;VENTOLIN HFA) 108 (90 Base) MCG/ACT inhaler Inhale into the lungs every 6 (six) hours as needed for wheezing or shortness of breath.   Taking  . aspirin 81 MG tablet Take 81 mg by mouth daily.   Taking  . carvedilol (COREG) 3.125 MG tablet Take 1 tablet (3.125 mg total) by mouth 2 (two) times daily. 180 tablet 3   . chlorthalidone (HYGROTON) 25 MG tablet Take 25 mg by mouth daily.  4   . doxazosin (CARDURA) 2 MG tablet Take 2 mg by mouth daily.    Taking  . Evolocumab (REPATHA SURECLICK) 140 MG/ML SOAJ Inject 1 pen into the skin every 14 (fourteen) days. 6 pen 3 Taking  . fenofibrate (TRICOR) 145 MG tablet Take 145 mg by mouth daily.    Taking  . finasteride (PROSCAR) 5 MG tablet Take 5 mg by mouth daily.    Taking  . ibuprofen (ADVIL,MOTRIN) 800 MG tablet Take 800 mg  by mouth daily as needed.   Taking  . losartan (COZAAR) 100 MG tablet Take 100 mg by mouth daily.    Taking  . senna-docusate (SENOKOT S) 8.6-50 MG tablet Take 1 tablet by mouth 2 (two) times daily as needed for mild constipation.   Taking  . tamsulosin (FLOMAX) 0.4 MG CAPS capsule Take 1 capsule by mouth daily.  11     Assessment: Pharmacy consulted for heparin drip dosing and monitoring for ACS/Chest Pain in 72yo male. No anticoagulants reported PTA.    Goal of Therapy:  Heparin level 0.3-0.7 units/ml Monitor platelets by anticoagulation protocol: Yes   Plan:  Baseline labs ordered Dosing Weight: 110kg Give 4000 units bolus x 1 Start heparin infusion at 1300 units/hr Check anti-Xa level in 8 hours and daily while on heparin Continue to monitor H&H and platelets  7/12:  HL @ 22:44 = 0.12 Will order Heparin 3300 units IV X 1 bolus and increase drip rate to  1700 units/hr.  Will recheck HL 6 hrs after rate change.  7/13: HL @ 0530 = 0.26 Will order Heparin 1600 units IV X 1 bolus and increase drip rate to 1900 units/hr. Will recheck HL 6 hrs after rate change.    Kendel Pesnell D 07/06/2018,6:02 AM

## 2018-07-06 NOTE — Progress Notes (Signed)
ANTICOAGULATION CONSULT NOTE - Follow Up Consult  Pharmacy Consult for Heparin  Indication: chest pain/ACS  Allergies  Allergen Reactions  . Ampicillin Itching  . Capsicum Swelling    Paprika and black pepper both cause swelling of the lips    Patient Measurements: Height: 6\' 2"  (188 cm) Weight: 286 lb 1.6 oz (129.8 kg) IBW/kg (Calculated) : 82.2 Heparin Dosing Weight: 110 kg   Vital Signs: Temp: 97.5 F (36.4 C) (07/13 0750) Temp Source: Oral (07/13 0750) BP: 155/82 (07/13 0750) Pulse Rate: 81 (07/13 0750)  Labs: Recent Labs    07/05/18 1241 07/05/18 1407 07/05/18 1655 07/05/18 2244 07/06/18 0530 07/06/18 1211  HGB 17.0  --   --   --  14.8  --   HCT 49.5  --   --   --  42.0  --   PLT 179  --   --   --  164  --   APTT  --  29  --   --   --   --   LABPROT  --  13.1  --   --   --   --   INR  --  1.00  --   --   --   --   HEPARINUNFRC  --   --   --  0.12* 0.26* 0.39  CREATININE 1.21  --   --   --  1.03  --   TROPONINI 0.09*  --  0.32* 1.04* 1.20*  --     Estimated Creatinine Clearance: 95.5 mL/min (by C-G formula based on SCr of 1.03 mg/dL).   Medications:  Medications Prior to Admission  Medication Sig Dispense Refill Last Dose  . albuterol (PROVENTIL HFA;VENTOLIN HFA) 108 (90 Base) MCG/ACT inhaler Inhale into the lungs every 6 (six) hours as needed for wheezing or shortness of breath.   Taking  . aspirin 81 MG tablet Take 81 mg by mouth daily.   Taking  . carvedilol (COREG) 3.125 MG tablet Take 1 tablet (3.125 mg total) by mouth 2 (two) times daily. 180 tablet 3   . chlorthalidone (HYGROTON) 25 MG tablet Take 25 mg by mouth daily.  4   . doxazosin (CARDURA) 2 MG tablet Take 2 mg by mouth daily.    Taking  . Evolocumab (REPATHA SURECLICK) 140 MG/ML SOAJ Inject 1 pen into the skin every 14 (fourteen) days. 6 pen 3 Taking  . fenofibrate (TRICOR) 145 MG tablet Take 145 mg by mouth daily.    Taking  . finasteride (PROSCAR) 5 MG tablet Take 5 mg by mouth daily.     Taking  . ibuprofen (ADVIL,MOTRIN) 800 MG tablet Take 800 mg by mouth daily as needed.   Taking  . losartan (COZAAR) 100 MG tablet Take 100 mg by mouth daily.    Taking  . senna-docusate (SENOKOT S) 8.6-50 MG tablet Take 1 tablet by mouth 2 (two) times daily as needed for mild constipation.   Taking  . tamsulosin (FLOMAX) 0.4 MG CAPS capsule Take 1 capsule by mouth daily.  11    Pharmacy consulted for heparin drip dosing and monitoring for ACS/Chest Pain in 72yo male. No anticoagulants reported PTA.    Goal of Therapy:  Heparin level 0.3-0.7 units/ml Monitor platelets by anticoagulation protocol: Yes   Assessment/Plan:  Will continue heparin infusion at current rate and recheck in 6 hours.   Luisa Hart D 07/06/2018,12:58 PM

## 2018-07-06 NOTE — Progress Notes (Signed)
Progress Note  Patient Name: Kurt Kelley Date of Encounter: 07/06/2018  Primary Cardiologist: Lorine Bears, MD   Subjective   Feels much better today.  He did have a little bit of jaw pain while shaving this morning after having stopped nitroglycerin drip. No issues with breathing  Inpatient Medications    Scheduled Meds: . aspirin EC  81 mg Oral Daily  . carvedilol  3.125 mg Oral BID  . chlorthalidone  25 mg Oral Daily  . doxazosin  2 mg Oral Daily  . Evolocumab  1 pen Subcutaneous Q14 Days  . fenofibrate  160 mg Oral QPM  . finasteride  5 mg Oral Daily  . insulin aspart  0-9 Units Subcutaneous TID WC  . losartan  100 mg Oral Daily  . senna  1 tablet Oral BID  . tamsulosin  0.4 mg Oral Daily   Continuous Infusions: . heparin 1,900 Units/hr (07/06/18 8270)   PRN Meds: acetaminophen **OR** acetaminophen, albuterol, nitroGLYCERIN, ondansetron **OR** ondansetron (ZOFRAN) IV, senna-docusate   Vital Signs    Vitals:   07/05/18 2301 07/06/18 0202 07/06/18 0435 07/06/18 0750  BP: (!) 118/57 121/75 134/69 (!) 155/82  Pulse: 63 67 69 81  Resp:   17 18  Temp:   97.7 F (36.5 C) (!) 97.5 F (36.4 C)  TempSrc:   Oral Oral  SpO2:  96% 98% 96%  Weight:      Height:        Intake/Output Summary (Last 24 hours) at 07/06/2018 1031 Last data filed at 07/06/2018 0832 Gross per 24 hour  Intake 292 ml  Output 725 ml  Net -433 ml   Filed Weights   07/05/18 1235 07/05/18 1621  Weight: 280 lb (127 kg) 286 lb 1.6 oz (129.8 kg)    Telemetry    Sinus rhythm with PVCs, rates in the 80s.- Personally Reviewed  ECG    No new EKG- Personally Reviewed  Physical Exam   Physical Exam  Constitutional: He is oriented to person, place, and time. He appears well-developed and well-nourished.  HENT:  Head: Normocephalic and atraumatic.  Neck: No hepatojugular reflux and no JVD present. Carotid bruit is not present.  Cardiovascular: Normal rate, regular rhythm, intact distal  pulses and normal pulses.  No extrasystoles are present. PMI is not displaced. Exam reveals no gallop and no friction rub.  Murmur heard.  Medium-pitched harsh crescendo-decrescendo midsystolic murmur is present with a grade of 2/6 at the upper right sternal border radiating to the neck. Pulmonary/Chest: Effort normal and breath sounds normal. No respiratory distress. He has no wheezes. He has no rales.  Abdominal: Soft. Bowel sounds are normal. He exhibits no distension. There is no tenderness.  Musculoskeletal: Normal range of motion. He exhibits no edema.  Neurological: He is alert and oriented to person, place, and time.  Psychiatric: He has a normal mood and affect. His behavior is normal. Judgment and thought content normal.  Vitals reviewed.   Labs    Chemistry Recent Labs  Lab 07/05/18 1241 07/06/18 0530  NA 137 141  K 3.9 4.1  CL 106 107  CO2 21* 26  GLUCOSE 281* 145*  BUN 20 16  CREATININE 1.21 1.03  CALCIUM 9.9 9.1  GFRNONAA 59* >60  GFRAA >60 >60  ANIONGAP 10 8     Hematology Recent Labs  Lab 07/05/18 1241 07/06/18 0530  WBC 7.2 9.4  RBC 5.93* 5.06  HGB 17.0 14.8  HCT 49.5 42.0  MCV 83.5 83.0  MCH 28.8 29.3  MCHC 34.4 35.3  RDW 14.2 13.8  PLT 179 164    Cardiac Enzymes Recent Labs  Lab 07/05/18 1241 07/05/18 1655 07/05/18 2244 07/06/18 0530  TROPONINI 0.09* 0.32* 1.04* 1.20*   No results for input(s): TROPIPOC in the last 168 hours.   BNPNo results for input(s): BNP, PROBNP in the last 168 hours.   DDimer No results for input(s): DDIMER in the last 168 hours.   Radiology    Dg Chest 2 View  Result Date: 07/05/2018 CLINICAL DATA:  72 year old male with history of midsternal pain starting 2 days ago with radiation into the neck in the right shoulder. Some associated shortness of breath. Pain worsening over the past 2 days. EXAM: CHEST - 2 VIEW COMPARISON:  Chest x-ray 11/23/2004. FINDINGS: Lung volumes are normal. No consolidative airspace  disease. No pleural effusions. No pneumothorax. No pulmonary nodule or mass noted. Pulmonary vasculature and the cardiomediastinal silhouette are within normal limits. IMPRESSION: No radiographic evidence of acute cardiopulmonary disease. Electronically Signed   By: Trudie Reed M.D.   On: 07/05/2018 12:56    Cardiac Studies   Echo 04/2018 - reviewed in [facility] (notably EF improved to 50 to 55% from 40-45%).  Mild to moderate AS Myoview 09/2017 [facility] clinic)-normal perfusion with EF 39%.  Patient Profile     72 y.o. male with known history of CAD (distant PCI in 2003), mild to moderate aortic stenosis, mild ischemic cardiomyopathy, and PAD who presented to [facility] with severe chest pain after a fall and was noted to have elevated troponin initially concerning for possible demand ischemia versus non-STEMI.  Troponin level has not increased to 1.2 more consistent with non-STEMI.  Assessment & Plan    Principal Problem:   NSTEMI (non-ST elevated myocardial infarction) (HCC) Active Problems:   CAD S/P percutaneous coronary angioplasty   Syncope and collapse   Essential hypertension   Moderate aortic stenosis  Principal Problem:   NSTEMI (non-ST elevated myocardial infarction) (HCC) w/ h/o CAD S/P percutaneous coronary angioplasty Third set of cardiac markers more increased and now more suggestive of true ACS.  Per Dr. Jari Sportsman last clinic note, with reduced EF on prior echo and Myoview he would have a low threshold to consider invasive evaluation, now in setting of positive troponin, recommend cardiac catheterization potentially Monday.  (Stress test not likely ideal).  Make n.p.o. after midnight on Monday, will tentatively scheduled for cath/PCI on Monday with Dr. Kirke Corin.  Continue IV heparin (IV nitroglycerin if necessary for chest pain) -with nitroglycerin drip discontinued, will start Imdur for now.  Continue low-dose aspirin, low-dose beta-blocker and ARB.  Statin intolerant, is on  fenofibrate.  Will likely need to consider referral to outpatient lipid clinic to consider PCSK9 inhibitor.       Syncope and collapse -most likely related to dehydration and orthostasis.  Follow-up for arrhythmias.    Essential hypertension --> now off nitroglycerin drip.  For now would continue current dose of beta-blocker and ARB. ->  Depending on blood pressure and heart rate, may need to titrate up beta-blocker.  Cautious use of chlorthalidone in setting of recent dehydration.    Moderate aortic stenosis -likely not significant enough to lead to syncope on its own, however in setting of dehydration, it is possible that this could have exacerbated his episode.  (No need to recheck echo)      For questions or updates, please contact [facility] Please consult [email or web address] for contact info under Cardiology/STEMI.  Signed, Bryan Lemma, MD  07/06/2018, 10:31 AM

## 2018-07-06 NOTE — Progress Notes (Signed)
[facility] - [place] at [facility]   PATIENT NAME: Kurt Kelley    MR#:  [id number]  DATE OF BIRTH:  1946/08/25  SUBJECTIVE:   No cp at present. Nitro off On IV heparin gtt  No cp out in the chair Wife in the room REVIEW OF SYSTEMS:   Review of Systems  Constitutional: Negative for chills, fever and weight loss.  HENT: Negative for ear discharge, ear pain and nosebleeds.   Eyes: Negative for blurred vision, pain and discharge.  Respiratory: Negative for sputum production, shortness of breath, wheezing and stridor.   Cardiovascular: Negative for chest pain, palpitations, orthopnea and PND.  Gastrointestinal: Negative for abdominal pain, diarrhea, nausea and vomiting.  Genitourinary: Negative for frequency and urgency.  Musculoskeletal: Negative for back pain and joint pain.  Neurological: Negative for sensory change, speech change, focal weakness and weakness.  Psychiatric/Behavioral: Negative for depression and hallucinations. The patient is not nervous/anxious.    Tolerating Diet:yes Tolerating PT: not needed  DRUG ALLERGIES:   Allergies  Allergen Reactions  . Ampicillin Itching  . Capsicum Swelling    Paprika and black pepper both cause swelling of the lips    VITALS:  Blood pressure (!) 155/82, pulse 81, temperature (!) 97.5 F (36.4 C), temperature source Oral, resp. rate 18, height 6\' 2"  (1.88 m), weight 129.8 kg (286 lb 1.6 oz), SpO2 96 %.  PHYSICAL EXAMINATION:   Physical Exam  GENERAL:  72 y.o.-year-old patient lying in the bed with no acute distress.  EYES: Pupils equal, round, reactive to light and accommodation. No scleral icterus. Extraocular muscles intact.  HEENT: Head atraumatic, normocephalic. Oropharynx and nasopharynx clear.  NECK:  Supple, no jugular venous distention. No thyroid enlargement, no tenderness.  LUNGS: Normal breath sounds bilaterally, no wheezing, rales, rhonchi. No use of accessory muscles of respiration.   CARDIOVASCULAR: S1, S2 normal. No murmurs, rubs, or gallops.  ABDOMEN: Soft, nontender, nondistended. Bowel sounds present. No organomegaly or mass.  EXTREMITIES: No cyanosis, clubbing or edema b/l.    NEUROLOGIC: Cranial nerves II through XII are intact. No focal Motor or sensory deficits b/l.   PSYCHIATRIC:  patient is alert and oriented x 3.  SKIN: No obvious rash, lesion, or ulcer.   LABORATORY PANEL:  CBC Recent Labs  Lab 07/06/18 0530  WBC 9.4  HGB 14.8  HCT 42.0  PLT 164    Chemistries  Recent Labs  Lab 07/05/18 1655 07/06/18 0530  NA  --  141  K  --  4.1  CL  --  107  CO2  --  26  GLUCOSE  --  145*  BUN  --  16  CREATININE  --  1.03  CALCIUM  --  9.1  MG 2.1  --    Cardiac Enzymes Recent Labs  Lab 07/06/18 0530  TROPONINI 1.20*   RADIOLOGY:  Dg Chest 2 View  Result Date: 07/05/2018 CLINICAL DATA:  72 year old male with history of midsternal pain starting 2 days ago with radiation into the neck in the right shoulder. Some associated shortness of breath. Pain worsening over the past 2 days. EXAM: CHEST - 2 VIEW COMPARISON:  Chest x-ray 11/23/2004. FINDINGS: Lung volumes are normal. No consolidative airspace disease. No pleural effusions. No pneumothorax. No pulmonary nodule or mass noted. Pulmonary vasculature and the cardiomediastinal silhouette are within normal limits. IMPRESSION: No radiographic evidence of acute cardiopulmonary disease. Electronically Signed   By: Trudie Reed M.D.   On: 07/05/2018 12:56   ASSESSMENT AND  PLAN:  Kurt Kelley  is a 72 y.o. male with a known history of ischemic cardiomyopathy, chronic systolic mild congestive heart failure, coronary artery disease status post stent times one in 2003, peripheral vascular disease and sleep apnea comes to the emergency room after he started having chest pain which was progressively increasing. Patient said he mowed his lawn two days ago felt very exhausted became diaphoretic started having  chest pain  1. acute NSTEMI/unstable angina patient with known coronary artery disease status post stent in 2003 -factors with hypertension, ischemic cardiomyopathy, hyperlipidemia intolerant to statins and diabetes along with sleep apnea - CE 0.09--0.32--1.04--1.20 -IV Nitro off -cont  IV heparin, continue cardiac meds including aspirin Plavix -cardiology consultation with Dr. Herbie Baltimore appreciated- Heart cath on monday  2. uncontrolled type II diabetes--new -sliding scale insulin -A1c 7.5 -will start po glipizide (since going to get cath and avoid metformin)  3. Malignant hypertension -pressure much improved after meds given in the ER. -Continue cardiac meds  4. Hyperlipidemia intolerant to statins -IM Repatha--will give home dose today  5. Morbid obesity with obstructive sleep apnea  6. DVT prophylaxis already on IV heparin drip  Above was discussed with patient and patient's wife  D/w Dr harding  Case discussed with Care Management/Social Worker. Management plans discussed with the patient, family and they are in agreement.  CODE STATUS: full  DVT Prophylaxis: heparin gtt  TOTAL TIME TAKING CARE OF THIS PATIENT: *30* minutes.  >50% time spent on counselling and coordination of care  POSSIBLE D/C IN *2-3* DAYS, DEPENDING ON CLINICAL CONDITION.  Note: This dictation was prepared with Dragon dictation along with smaller phrase technology. Any transcriptional errors that result from this process are unintentional.  Enedina Finner M.D on 07/06/2018 at 12:27 PM  Between 7am to 6pm - Pager - [phone]  After 6pm go to [email or web address] - Social research officer, government  [facility]  Office  [phone]  CC: Primary care physician; Jerl Mina, MDPatient ID: Kurt Kelley, male   DOB: Jul 11, 1946, 72 y.o.   MRN: [id number]

## 2018-07-06 NOTE — Progress Notes (Signed)
ANTICOAGULATION CONSULT NOTE - Follow Up Consult  Pharmacy Consult for Heparin  Indication: chest pain/ACS  Allergies  Allergen Reactions  . Ampicillin Itching  . Capsicum Swelling    Paprika and black pepper both cause swelling of the lips    Patient Measurements: Height: 6\' 2"  (188 cm) Weight: 282 lb 11.2 oz (128.2 kg) IBW/kg (Calculated) : 82.2 Heparin Dosing Weight: 110 kg   Vital Signs: Temp: 98.3 F (36.8 C) (07/13 1704) Temp Source: Oral (07/13 1704) BP: 109/66 (07/13 1704) Pulse Rate: 83 (07/13 1704)  Labs: Recent Labs    07/05/18 1241 07/05/18 1407 07/05/18 1655  07/05/18 2244 07/06/18 0530 07/06/18 1211 07/06/18 1811  HGB 17.0  --   --   --   --  14.8  --   --   HCT 49.5  --   --   --   --  42.0  --   --   PLT 179  --   --   --   --  164  --   --   APTT  --  29  --   --   --   --   --   --   LABPROT  --  13.1  --   --   --   --   --   --   INR  --  1.00  --   --   --   --   --   --   HEPARINUNFRC  --   --   --    < > 0.12* 0.26* 0.39 0.37  CREATININE 1.21  --   --   --   --  1.03  --   --   TROPONINI 0.09*  --  0.32*  --  1.04* 1.20*  --   --    < > = values in this interval not displayed.    Estimated Creatinine Clearance: 95 mL/min (by C-G formula based on SCr of 1.03 mg/dL).   Medications:  Medications Prior to Admission  Medication Sig Dispense Refill Last Dose  . albuterol (PROVENTIL HFA;VENTOLIN HFA) 108 (90 Base) MCG/ACT inhaler Inhale into the lungs every 6 (six) hours as needed for wheezing or shortness of breath.   Taking  . aspirin 81 MG tablet Take 81 mg by mouth daily.   Taking  . carvedilol (COREG) 3.125 MG tablet Take 1 tablet (3.125 mg total) by mouth 2 (two) times daily. 180 tablet 3   . chlorthalidone (HYGROTON) 25 MG tablet Take 25 mg by mouth daily.  4   . doxazosin (CARDURA) 2 MG tablet Take 2 mg by mouth daily.    Taking  . Evolocumab (REPATHA SURECLICK) 140 MG/ML SOAJ Inject 1 pen into the skin every 14 (fourteen) days. 6 pen  3 Taking  . fenofibrate (TRICOR) 145 MG tablet Take 145 mg by mouth daily.    Taking  . finasteride (PROSCAR) 5 MG tablet Take 5 mg by mouth daily.    Taking  . ibuprofen (ADVIL,MOTRIN) 800 MG tablet Take 800 mg by mouth daily as needed.   Taking  . losartan (COZAAR) 100 MG tablet Take 100 mg by mouth daily.    Taking  . senna-docusate (SENOKOT S) 8.6-50 MG tablet Take 1 tablet by mouth 2 (two) times daily as needed for mild constipation.   Taking  . tamsulosin (FLOMAX) 0.4 MG CAPS capsule Take 1 capsule by mouth daily.  11    Pharmacy consulted for heparin drip dosing and monitoring  for ACS/Chest Pain in 71yo male. No anticoagulants reported PTA.    Goal of Therapy:  Heparin level 0.3-0.7 units/ml Monitor platelets by anticoagulation protocol: Yes   Assessment/Plan:  7/13 1811 HL 0.37. Level therapeutic x 2. Will continue current infusion rate of 1900 units/hr. Will continue to check HL and CBC with AM labs per protocol.   Gardner Candle, PharmD, BCPS Clinical Pharmacist 07/06/2018 6:32 PM

## 2018-07-06 NOTE — Progress Notes (Signed)
No head-to-toe nursing assessment was charted on patient overnight. Reason unknown. Will chart one for day shift at this time. Jari Favre [facility]

## 2018-07-07 LAB — CBC
HCT: 43.6 % (ref 40.0–52.0)
Hemoglobin: 15.1 g/dL (ref 13.0–18.0)
MCH: 29 pg (ref 26.0–34.0)
MCHC: 34.7 g/dL (ref 32.0–36.0)
MCV: 83.7 fL (ref 80.0–100.0)
PLATELETS: 161 10*3/uL (ref 150–440)
RBC: 5.21 MIL/uL (ref 4.40–5.90)
RDW: 13.9 % (ref 11.5–14.5)
WBC: 6.7 10*3/uL (ref 3.8–10.6)

## 2018-07-07 LAB — GLUCOSE, CAPILLARY
GLUCOSE-CAPILLARY: 123 mg/dL — AB (ref 70–99)
GLUCOSE-CAPILLARY: 124 mg/dL — AB (ref 70–99)
GLUCOSE-CAPILLARY: 116 mg/dL — AB (ref 70–99)
Glucose-Capillary: 90 mg/dL (ref 70–99)

## 2018-07-07 LAB — HEPARIN LEVEL (UNFRACTIONATED): HEPARIN UNFRACTIONATED: 0.36 [IU]/mL (ref 0.30–0.70)

## 2018-07-07 LAB — TROPONIN I: Troponin I: 0.63 ng/mL (ref <0.03)

## 2018-07-07 MED ORDER — MORPHINE SULFATE (PF) 2 MG/ML IV SOLN: 2.0000 mg | INTRAVENOUS | Status: DC | PRN

## 2018-07-07 MED ORDER — GLIPIZIDE ER 5 MG PO TB24
5.0000 mg | ORAL_TABLET | Freq: Every day | ORAL | Status: DC
  Administered 2018-07-07: 5 mg via ORAL
  Filled 2018-07-07 (×2): qty 1

## 2018-07-07 MED ORDER — LABETALOL HCL 5 MG/ML IV SOLN: 10.0000 mg | INTRAVENOUS | Status: DC | PRN

## 2018-07-07 NOTE — Progress Notes (Signed)
[facility] - [place] at [facility]   PATIENT NAME: Taurus Majkut    MR#:  [id number]  DATE OF BIRTH:  01-13-1946  SUBJECTIVE:   Mild cp this am..given SL nitro. Pt seems a bit anxious On IV heparin gtt   out in the chair  REVIEW OF SYSTEMS:   Review of Systems  Constitutional: Negative for chills, fever and weight loss.  HENT: Negative for ear discharge, ear pain and nosebleeds.   Eyes: Negative for blurred vision, pain and discharge.  Respiratory: Negative for sputum production, shortness of breath, wheezing and stridor.   Cardiovascular: Negative for chest pain, palpitations, orthopnea and PND.  Gastrointestinal: Negative for abdominal pain, diarrhea, nausea and vomiting.  Genitourinary: Negative for frequency and urgency.  Musculoskeletal: Negative for back pain and joint pain.  Neurological: Negative for sensory change, speech change, focal weakness and weakness.  Psychiatric/Behavioral: Negative for depression and hallucinations. The patient is not nervous/anxious.    Tolerating Diet:yes Tolerating PT: not needed  DRUG ALLERGIES:   Allergies  Allergen Reactions  . Ampicillin Itching  . Capsicum Swelling    Paprika and black pepper both cause swelling of the lips    VITALS:  Blood pressure (!) 183/97, pulse 81, temperature 97.7 F (36.5 C), resp. rate 18, height 6\' 2"  (1.88 m), weight 127.8 kg (281 lb 12.8 oz), SpO2 98 %.  PHYSICAL EXAMINATION:   Physical Exam  GENERAL:  72 y.o.-year-old patient lying in the bed with no acute distress. obese EYES: Pupils equal, round, reactive to light and accommodation. No scleral icterus. Extraocular muscles intact.  HEENT: Head atraumatic, normocephalic. Oropharynx and nasopharynx clear.  NECK:  Supple, no jugular venous distention. No thyroid enlargement, no tenderness.  LUNGS: Normal breath sounds bilaterally, no wheezing, rales, rhonchi. No use of accessory muscles of respiration.  CARDIOVASCULAR:  S1, S2 normal. No murmurs, rubs, or gallops.  ABDOMEN: Soft, nontender, nondistended. Bowel sounds present. No organomegaly or mass.  EXTREMITIES: No cyanosis, clubbing or edema b/l.    NEUROLOGIC: Cranial nerves II through XII are intact. No focal Motor or sensory deficits b/l.   PSYCHIATRIC:  patient is alert and oriented x 3.  SKIN: No obvious rash, lesion, or ulcer.   LABORATORY PANEL:  CBC Recent Labs  Lab 07/07/18 0447  WBC 6.7  HGB 15.1  HCT 43.6  PLT 161    Chemistries  Recent Labs  Lab 07/05/18 1655 07/06/18 0530  NA  --  141  K  --  4.1  CL  --  107  CO2  --  26  GLUCOSE  --  145*  BUN  --  16  CREATININE  --  1.03  CALCIUM  --  9.1  MG 2.1  --    Cardiac Enzymes Recent Labs  Lab 07/06/18 0530  TROPONINI 1.20*   RADIOLOGY:  Dg Chest 2 View  Result Date: 07/05/2018 CLINICAL DATA:  72 year old male with history of midsternal pain starting 2 days ago with radiation into the neck in the right shoulder. Some associated shortness of breath. Pain worsening over the past 2 days. EXAM: CHEST - 2 VIEW COMPARISON:  Chest x-ray 11/23/2004. FINDINGS: Lung volumes are normal. No consolidative airspace disease. No pleural effusions. No pneumothorax. No pulmonary nodule or mass noted. Pulmonary vasculature and the cardiomediastinal silhouette are within normal limits. IMPRESSION: No radiographic evidence of acute cardiopulmonary disease. Electronically Signed   By: Trudie Reed M.D.   On: 07/05/2018 12:56   ASSESSMENT AND PLAN:  Dorene Sorrow  [place]  is a 72 y.o. male with a known history of ischemic cardiomyopathy, chronic systolic mild congestive heart failure, coronary artery disease status post stent times one in 2003, peripheral vascular disease and sleep apnea comes to the emergency room after he started having chest pain which was progressively increasing. Patient said he mowed his lawn two days ago felt very exhausted became diaphoretic started having chest pain  1.  acute NSTEMI/unstable angina patient with known coronary artery disease status post stent in 2003 -factors with hypertension, ischemic cardiomyopathy, hyperlipidemia intolerant to statins and diabetes along with sleep apnea - CE 0.09--0.32--1.04--1.20 -IV Nitro off now -cont  IV heparin, continue aspirin and Plavix -cardiology consultation with Dr. Herbie Baltimore appreciated- Heart cath on Monday by Dr Kirke Corin  2. uncontrolled type II diabetes--new -sliding scale insulin -A1c 7.5 -will start po glipizide (since going to get cath,avoid metformin)  3. Malignant hypertension -pressure much improved after meds given in the ER. -Continue cardiac meds  4. Hyperlipidemia intolerant to statins -SQ Repatha--will give home dose today  5. Morbid obesity with obstructive sleep apnea  6. DVT prophylaxis already on IV heparin drip  Above was discussed with patient   D/w Dr harding  Case discussed with Care Management/Social Worker. Management plans discussed with the patient, family and they are in agreement.  CODE STATUS: full  DVT Prophylaxis: heparin gtt  TOTAL TIME TAKING CARE OF THIS PATIENT: *30* minutes.  >50% time spent on counselling and coordination of care  POSSIBLE D/C IN *1-2* DAYS, DEPENDING ON CLINICAL CONDITION.  Note: This dictation was prepared with Dragon dictation along with smaller phrase technology. Any transcriptional errors that result from this process are unintentional.  Enedina Finner M.D on 07/07/2018 at 8:20 AM  Between 7am to 6pm - Pager - [phone]  After 6pm go to [email or web address] - Social research officer, government  [facility]  Office  [phone]  CC: Primary care physician; Jerl Mina, MDPatient ID: [name], male   DOB: 09/10/1946, 72 y.o.   MRN: [id number]

## 2018-07-07 NOTE — Progress Notes (Signed)
ANTICOAGULATION CONSULT NOTE - Follow Up Consult  Pharmacy Consult for Heparin  Indication: chest pain/ACS  Allergies  Allergen Reactions  . Ampicillin Itching  . Capsicum Swelling    Paprika and black pepper both cause swelling of the lips    Patient Measurements: Height: 6\' 2"  (188 cm) Weight: 282 lb 11.2 oz (128.2 kg) IBW/kg (Calculated) : 82.2 Heparin Dosing Weight: 110 kg   Vital Signs: Temp: 98.4 F (36.9 C) (07/14 0452) Temp Source: Oral (07/14 0452) BP: 136/62 (07/14 0452) Pulse Rate: 69 (07/14 0452)  Labs: Recent Labs    07/05/18 1241 07/05/18 1407 07/05/18 1655  07/05/18 2244 07/06/18 0530 07/06/18 1211 07/06/18 1811 07/07/18 0447  HGB 17.0  --   --   --   --  14.8  --   --  15.1  HCT 49.5  --   --   --   --  42.0  --   --  43.6  PLT 179  --   --   --   --  164  --   --  161  APTT  --  29  --   --   --   --   --   --   --   LABPROT  --  13.1  --   --   --   --   --   --   --   INR  --  1.00  --   --   --   --   --   --   --   HEPARINUNFRC  --   --   --    < > 0.12* 0.26* 0.39 0.37 0.36  CREATININE 1.21  --   --   --   --  1.03  --   --   --   TROPONINI 0.09*  --  0.32*  --  1.04* 1.20*  --   --   --    < > = values in this interval not displayed.    Estimated Creatinine Clearance: 95 mL/min (by C-G formula based on SCr of 1.03 mg/dL).   Medications:  Medications Prior to Admission  Medication Sig Dispense Refill Last Dose  . albuterol (PROVENTIL HFA;VENTOLIN HFA) 108 (90 Base) MCG/ACT inhaler Inhale into the lungs every 6 (six) hours as needed for wheezing or shortness of breath.   Taking  . aspirin 81 MG tablet Take 81 mg by mouth daily.   Taking  . carvedilol (COREG) 3.125 MG tablet Take 1 tablet (3.125 mg total) by mouth 2 (two) times daily. 180 tablet 3   . chlorthalidone (HYGROTON) 25 MG tablet Take 25 mg by mouth daily.  4   . doxazosin (CARDURA) 2 MG tablet Take 2 mg by mouth daily.    Taking  . Evolocumab (REPATHA SURECLICK) 140 MG/ML SOAJ  Inject 1 pen into the skin every 14 (fourteen) days. 6 pen 3 Taking  . fenofibrate (TRICOR) 145 MG tablet Take 145 mg by mouth daily.    Taking  . finasteride (PROSCAR) 5 MG tablet Take 5 mg by mouth daily.    Taking  . ibuprofen (ADVIL,MOTRIN) 800 MG tablet Take 800 mg by mouth daily as needed.   Taking  . losartan (COZAAR) 100 MG tablet Take 100 mg by mouth daily.    Taking  . senna-docusate (SENOKOT S) 8.6-50 MG tablet Take 1 tablet by mouth 2 (two) times daily as needed for mild constipation.   Taking  . tamsulosin (FLOMAX) 0.4 MG  CAPS capsule Take 1 capsule by mouth daily.  11    Pharmacy consulted for heparin drip dosing and monitoring for ACS/Chest Pain in 72yo male. No anticoagulants reported PTA.    Goal of Therapy:  Heparin level 0.3-0.7 units/ml Monitor platelets by anticoagulation protocol: Yes   Assessment/Plan:  7/13 1811 HL 0.37. Level therapeutic x 2. Will continue current infusion rate of 1900 units/hr. Will continue to check HL and CBC with AM labs per protocol.   7/14 :  HL @ 0447 = 0.36 Will continue this pt on current rate and recheck HL on 7/15 with AM labs.   Scherrie Gerlach, PharmD Clinical Pharmacist 07/07/2018 5:26 AM

## 2018-07-07 NOTE — Progress Notes (Signed)
Progress Note  Patient Name: Kurt Kelley Date of Encounter: 07/07/2018  Primary Cardiologist: Lorine Bears, MD   Subjective   Did have some CP today -relieved with nitroglycerin.  This is a second which was had since yesterday. No issues with breathing, no nausea or vomiting He says he has up and down moments, but feeling better overall.  Inpatient Medications    Scheduled Meds: . aspirin EC  81 mg Oral Daily  . carvedilol  3.125 mg Oral BID  . chlorthalidone  25 mg Oral Daily  . doxazosin  2 mg Oral Daily  . Evolocumab  1 pen Subcutaneous Q14 Days  . fenofibrate  160 mg Oral QPM  . finasteride  5 mg Oral Daily  . glipiZIDE  5 mg Oral Q breakfast  . insulin aspart  0-9 Units Subcutaneous TID WC  . isosorbide mononitrate  30 mg Oral Daily  . losartan  100 mg Oral Daily  . senna  1 tablet Oral BID  . tamsulosin  0.4 mg Oral Daily   Continuous Infusions: . heparin 1,900 Units/hr (07/07/18 0635)   PRN Meds: acetaminophen **OR** acetaminophen, albuterol, nitroGLYCERIN, ondansetron **OR** ondansetron (ZOFRAN) IV, senna-docusate   Vital Signs    Vitals:   07/07/18 0452 07/07/18 0500 07/07/18 0748 07/07/18 1019  BP: 136/62  (!) 183/97 90/70  Pulse: 69  81 77  Resp: 18  18   Temp: 98.4 F (36.9 C)  97.7 F (36.5 C)   TempSrc: Oral     SpO2: 96%  98%   Weight:  281 lb 12.8 oz (127.8 kg)    Height:        Intake/Output Summary (Last 24 hours) at 07/07/2018 1333 Last data filed at 07/07/2018 1030 Gross per 24 hour  Intake 389.47 ml  Output 1875 ml  Net -1485.53 ml   Filed Weights   07/05/18 1621 07/06/18 1700 07/07/18 0500  Weight: 286 lb 1.6 oz (129.8 kg) 282 lb 11.2 oz (128.2 kg) 281 lb 12.8 oz (127.8 kg)    Telemetry    Sinus rhythm with PVCs, rates in the 80s.- Personally Reviewed  ECG    No New EKG- Personally Reviewed  Physical Exam   Physical Exam  Constitutional: He is oriented to person, place, and time. He appears well-developed and  well-nourished.  HENT:  Head: Normocephalic and atraumatic.  Eyes: Pupils are equal, round, and reactive to light. Conjunctivae and EOM are normal.  Neck: No hepatojugular reflux and no JVD present. Carotid bruit is not present.  Cardiovascular: Normal rate, regular rhythm, intact distal pulses and normal pulses.  No extrasystoles are present. PMI is not displaced. Exam reveals no gallop and no friction rub.  Murmur heard.  Medium-pitched harsh crescendo-decrescendo midsystolic murmur is present with a grade of 2/6 at the upper right sternal border radiating to the neck. Pulmonary/Chest: Effort normal and breath sounds normal. No respiratory distress. He has no wheezes. He has no rales.  Abdominal: Soft. Bowel sounds are normal. He exhibits no distension. There is no tenderness. There is no rebound.  No HSM  Musculoskeletal: Normal range of motion. He exhibits no edema.  Neurological: He is alert and oriented to person, place, and time.  Psychiatric: He has a normal mood and affect. His behavior is normal. Judgment and thought content normal.  Vitals reviewed.   Labs    Chemistry Recent Labs  Lab 07/05/18 1241 07/06/18 0530  NA 137 141  K 3.9 4.1  CL 106 107  CO2  21* 26  GLUCOSE 281* 145*  BUN 20 16  CREATININE 1.21 1.03  CALCIUM 9.9 9.1  GFRNONAA 59* >60  GFRAA >60 >60  ANIONGAP 10 8     Hematology Recent Labs  Lab 07/05/18 1241 07/06/18 0530 07/07/18 0447  WBC 7.2 9.4 6.7  RBC 5.93* 5.06 5.21  HGB 17.0 14.8 15.1  HCT 49.5 42.0 43.6  MCV 83.5 83.0 83.7  MCH 28.8 29.3 29.0  MCHC 34.4 35.3 34.7  RDW 14.2 13.8 13.9  PLT 179 164 161    Cardiac Enzymes Recent Labs  Lab 07/05/18 1241 07/05/18 1655 07/05/18 2244 07/06/18 0530  TROPONINI 0.09* 0.32* 1.04* 1.20*   No results for input(s): TROPIPOC in the last 168 hours.   BNPNo results for input(s): BNP, PROBNP in the last 168 hours.   DDimer No results for input(s): DDIMER in the last 168 hours.    Radiology    No results found.  Cardiac Studies   Echo 04/2018 - reviewed in PMH (notably EF improved to 50 to 55% from 40-45%).  Mild to moderate AS Myoview 09/2017 [facility] clinic)-normal perfusion with EF 39%.  Patient Profile     72 y.o. male with known history of CAD (distant PCI in 2003), mild to moderate aortic stenosis, mild ischemic cardiomyopathy, and PAD who presented to [facility] with severe chest pain after a fall and was noted to have elevated troponin initially concerning for possible demand ischemia versus non-STEMI.  Troponin level has not increased to 1.2 more consistent with non-STEMI.  Assessment & Plan    Principal Problem:   NSTEMI (non-ST elevated myocardial infarction) (HCC) Active Problems:   CAD S/P percutaneous coronary angioplasty   Syncope and collapse   Essential hypertension   Moderate aortic stenosis --Hyperlipidemia  Principal Problem:   NSTEMI (non-ST elevated myocardial infarction) (HCC) w/ h/o CAD S/P percutaneous coronary angioplasty -Third troponin up to 1.2 consistent with non-STEMI. Per Dr. Jari Sportsman last clinic note, with reduced EF on prior echo and Myoview he would have a low threshold to consider invasive evaluation,   Plan cardiac catheterization tomorrow with Dr. Kirke Corin.    Make n.p.o. after midnight --orders written  Continue IV heparin.  Started Imdur yesterday with current nitroglycerin requirement, will increase dose.-   Continue low-dose aspirin and low-dose beta-blocker along with ARB.  Blood pressures have been somewhat labile.   Statin intolerant, is on fenofibrate.  Will likely need to consider referral to outpatient lipid clinic to consider PCSK9 inhibitor.      Syncope and collapse - probably related to dehydration & orthostasis - no sign of arrhythmia.     Essential hypertension --> now off nitroglycerin drip.  Seems to be going up and down.  Currently on low-dose beta-blocker and ARB.    Until we see his blood  pressure stabilized, would simply continue current dose.  Currently hypotensive would hold chlorthalidone ( labile BP today) if BP remains low    Moderate aortic stenosis -unlikely to be the primary cause for either positive troponin or syncope, however the presence of AS could exacerbate dehydration related syncope.  We will evaluate gradient in the Cath Lab.  For questions or updates, please contact [facility] Please consult [email or web address] for contact info under Cardiology/STEMI.      Signed, Bryan Lemma, MD  07/07/2018, 1:33 PM

## 2018-07-07 NOTE — Progress Notes (Addendum)
Pt has an episode of chest pain 3 out 10  at 1947 two tab of nitro was given and chest pain went down to 0.  Update 2136: Pt has another episode of chest pain 6 out 10. BP was at 196/96 HR 83 initially. After first dose of nitro pt pain went down to 5. Second dose of nitro pain went down to 3. Third dose of nitro pain is at 0 and last BP was at 124/69. Notify prime. Doctor Anne Hahn called and ordered Troponin x 1, labetalol 10 mg IV  Every 2 hours PRN and morphine 2mg /ml evry 4 hours for severe pain. Pt was place on oxygen for comfort at this time. Will continue to monitor.  Update 2236: Pt was asleep att his time. Will continue to monitor.

## 2018-07-07 NOTE — H&P (View-Only) (Signed)
 Progress Note  Patient Name: Kurt Kelley Date of Encounter: 07/07/2018  Primary Cardiologist: Muhammad Arida, MD   Subjective   Did have some CP today -relieved with nitroglycerin.  This is a second which was had since yesterday. No issues with breathing, no nausea or vomiting He says he has up and down moments, but feeling better overall.  Inpatient Medications    Scheduled Meds: . aspirin EC  81 mg Oral Daily  . carvedilol  3.125 mg Oral BID  . chlorthalidone  25 mg Oral Daily  . doxazosin  2 mg Oral Daily  . Evolocumab  1 pen Subcutaneous Q14 Days  . fenofibrate  160 mg Oral QPM  . finasteride  5 mg Oral Daily  . glipiZIDE  5 mg Oral Q breakfast  . insulin aspart  0-9 Units Subcutaneous TID WC  . isosorbide mononitrate  30 mg Oral Daily  . losartan  100 mg Oral Daily  . senna  1 tablet Oral BID  . tamsulosin  0.4 mg Oral Daily   Continuous Infusions: . heparin 1,900 Units/hr (07/07/18 0635)   PRN Meds: acetaminophen **OR** acetaminophen, albuterol, nitroGLYCERIN, ondansetron **OR** ondansetron (ZOFRAN) IV, senna-docusate   Vital Signs    Vitals:   07/07/18 0452 07/07/18 0500 07/07/18 0748 07/07/18 1019  BP: 136/62  (!) 183/97 90/70  Pulse: 69  81 77  Resp: 18  18   Temp: 98.4 F (36.9 C)  97.7 F (36.5 C)   TempSrc: Oral     SpO2: 96%  98%   Weight:  281 lb 12.8 oz (127.8 kg)    Height:        Intake/Output Summary (Last 24 hours) at 07/07/2018 1333 Last data filed at 07/07/2018 1030 Gross per 24 hour  Intake 389.47 ml  Output 1875 ml  Net -1485.53 ml   Filed Weights   07/05/18 1621 07/06/18 1700 07/07/18 0500  Weight: 286 lb 1.6 oz (129.8 kg) 282 lb 11.2 oz (128.2 kg) 281 lb 12.8 oz (127.8 kg)    Telemetry    Sinus rhythm with PVCs, rates in the 80s.- Personally Reviewed  ECG    No New EKG- Personally Reviewed  Physical Exam   Physical Exam  Constitutional: He is oriented to person, place, and time. He appears well-developed and  well-nourished.  HENT:  Head: Normocephalic and atraumatic.  Eyes: Pupils are equal, round, and reactive to light. Conjunctivae and EOM are normal.  Neck: No hepatojugular reflux and no JVD present. Carotid bruit is not present.  Cardiovascular: Normal rate, regular rhythm, intact distal pulses and normal pulses.  No extrasystoles are present. PMI is not displaced. Exam reveals no gallop and no friction rub.  Murmur heard.  Medium-pitched harsh crescendo-decrescendo midsystolic murmur is present with a grade of 2/6 at the upper right sternal border radiating to the neck. Pulmonary/Chest: Effort normal and breath sounds normal. No respiratory distress. He has no wheezes. He has no rales.  Abdominal: Soft. Bowel sounds are normal. He exhibits no distension. There is no tenderness. There is no rebound.  No HSM  Musculoskeletal: Normal range of motion. He exhibits no edema.  Neurological: He is alert and oriented to person, place, and time.  Psychiatric: He has a normal mood and affect. His behavior is normal. Judgment and thought content normal.  Vitals reviewed.   Labs    Chemistry Recent Labs  Lab 07/05/18 1241 07/06/18 0530  NA 137 141  K 3.9 4.1  CL 106 107  CO2   21* 26  GLUCOSE 281* 145*  BUN 20 16  CREATININE 1.21 1.03  CALCIUM 9.9 9.1  GFRNONAA 59* >60  GFRAA >60 >60  ANIONGAP 10 8     Hematology Recent Labs  Lab 07/05/18 1241 07/06/18 0530 07/07/18 0447  WBC 7.2 9.4 6.7  RBC 5.93* 5.06 5.21  HGB 17.0 14.8 15.1  HCT 49.5 42.0 43.6  MCV 83.5 83.0 83.7  MCH 28.8 29.3 29.0  MCHC 34.4 35.3 34.7  RDW 14.2 13.8 13.9  PLT 179 164 161    Cardiac Enzymes Recent Labs  Lab 07/05/18 1241 07/05/18 1655 07/05/18 2244 07/06/18 0530  TROPONINI 0.09* 0.32* 1.04* 1.20*   No results for input(s): TROPIPOC in the last 168 hours.   BNPNo results for input(s): BNP, PROBNP in the last 168 hours.   DDimer No results for input(s): DDIMER in the last 168 hours.    Radiology    No results found.  Cardiac Studies   Echo 04/2018 - reviewed in PMH (notably EF improved to 50 to 55% from 40-45%).  Mild to moderate AS Myoview 09/2017 ([facility] clinic)-normal perfusion with EF 39%.  Patient Profile     71 y.o. male with known history of CAD (distant PCI in 2003), mild to moderate aortic stenosis, mild ischemic cardiomyopathy, and PAD who presented to [facility] with severe chest pain after a fall and was noted to have elevated troponin initially concerning for possible demand ischemia versus non-STEMI.  Troponin level has not increased to 1.2 more consistent with non-STEMI.  Assessment & Plan    Principal Problem:   NSTEMI (non-ST elevated myocardial infarction) (HCC) Active Problems:   CAD S/P percutaneous coronary angioplasty   Syncope and collapse   Essential hypertension   Moderate aortic stenosis --Hyperlipidemia  Principal Problem:   NSTEMI (non-ST elevated myocardial infarction) (HCC) w/ h/o CAD S/P percutaneous coronary angioplasty -Third troponin up to 1.2 consistent with non-STEMI. Per Dr. Arida's last clinic note, with reduced EF on prior echo and Myoview he would have a low threshold to consider invasive evaluation,   Plan cardiac catheterization tomorrow with Dr. Arida.    Make n.p.o. after midnight --orders written  Continue IV heparin.  Started Imdur yesterday with current nitroglycerin requirement, will increase dose.-   Continue low-dose aspirin and low-dose beta-blocker along with ARB.  Blood pressures have been somewhat labile.   Statin intolerant, is on fenofibrate.  Will likely need to consider referral to outpatient lipid clinic to consider PCSK9 inhibitor.      Syncope and collapse - probably related to dehydration & orthostasis - no sign of arrhythmia.     Essential hypertension --> now off nitroglycerin drip.  Seems to be going up and down.  Currently on low-dose beta-blocker and ARB.    Until we see his blood  pressure stabilized, would simply continue current dose.  Currently hypotensive would hold chlorthalidone ( labile BP today) if BP remains low    Moderate aortic stenosis -unlikely to be the primary cause for either positive troponin or syncope, however the presence of AS could exacerbate dehydration related syncope.  We will evaluate gradient in the Cath Lab.  For questions or updates, please contact [facility] Please consult [email or web address] for contact info under Cardiology/STEMI.      Signed, Dusty Wagoner, MD  07/07/2018, 1:33 PM    

## 2018-07-07 NOTE — Plan of Care (Signed)
  Problem: Education: Goal: Knowledge of General Education information will improve Outcome: Progressing   Problem: Health Behavior/Discharge Planning: Goal: Ability to manage health-related needs will improve Outcome: Progressing   Problem: Pain Managment: Goal: General experience of comfort will improve Outcome: Progressing   

## 2018-07-07 NOTE — Plan of Care (Signed)
  Problem: Education: Goal: Knowledge of General Education information will improve Outcome: Progressing   Problem: Health Behavior/Discharge Planning: Goal: Ability to manage health-related needs will improve Outcome: Progressing   Problem: Pain Managment: Goal: General experience of comfort will improve Outcome: Progressing   Problem: Safety: Goal: Ability to remain free from injury will improve Outcome: Progressing   

## 2018-07-08 ENCOUNTER — Encounter: Payer: Self-pay | Admitting: Cardiovascular Disease

## 2018-07-08 ENCOUNTER — Encounter
Admission: EM | Disposition: A | Discharge status: Home / Self Care | Payer: Self-pay | Source: Home / Self Care | Attending: Internal Medicine

## 2018-07-08 HISTORY — PX: LEFT HEART CATH AND CORONARY ANGIOGRAPHY: CATH118249

## 2018-07-08 HISTORY — PX: CORONARY STENT INTERVENTION: CATH118234

## 2018-07-08 LAB — POCT ACTIVATED CLOTTING TIME: ACTIVATED CLOTTING TIME: 324 s

## 2018-07-08 LAB — GLUCOSE, CAPILLARY
GLUCOSE-CAPILLARY: 120 mg/dL — AB (ref 70–99)
Glucose-Capillary: 114 mg/dL — ABNORMAL HIGH (ref 70–99)
Glucose-Capillary: 118 mg/dL — ABNORMAL HIGH (ref 70–99)
Glucose-Capillary: 88 mg/dL (ref 70–99)

## 2018-07-08 LAB — CBC
HCT: 46.4 % (ref 40.0–52.0)
Hemoglobin: 16.1 g/dL (ref 13.0–18.0)
MCH: 29.1 pg (ref 26.0–34.0)
MCHC: 34.6 g/dL (ref 32.0–36.0)
MCV: 84.1 fL (ref 80.0–100.0)
Platelets: 175 10*3/uL (ref 150–440)
RBC: 5.52 MIL/uL (ref 4.40–5.90)
RDW: 13.8 % (ref 11.5–14.5)
WBC: 6.5 10*3/uL (ref 3.8–10.6)

## 2018-07-08 LAB — HEPARIN LEVEL (UNFRACTIONATED): HEPARIN UNFRACTIONATED: 0.3 [IU]/mL (ref 0.30–0.70)

## 2018-07-08 SURGERY — LEFT HEART CATH AND CORONARY ANGIOGRAPHY
Anesthesia: Moderate Sedation

## 2018-07-08 MED ORDER — FENTANYL CITRATE (PF) 100 MCG/2ML IJ SOLN
INTRAMUSCULAR | Status: DC | PRN
  Administered 2018-07-08: 25 ug via INTRAVENOUS

## 2018-07-08 MED ORDER — MIDAZOLAM HCL 2 MG/2ML IJ SOLN
INTRAMUSCULAR | Status: DC | PRN
  Administered 2018-07-08: 1 mg via INTRAVENOUS

## 2018-07-08 MED ORDER — SODIUM CHLORIDE 0.9 % IV SOLN: 250.0000 mL | INTRAVENOUS | Status: DC | PRN

## 2018-07-08 MED ORDER — NITROGLYCERIN 5 MG/ML IV SOLN
INTRAVENOUS | Status: AC
  Filled 2018-07-08: qty 10

## 2018-07-08 MED ORDER — LINAGLIPTIN 5 MG PO TABS
5.0000 mg | ORAL_TABLET | Freq: Every day | ORAL | Status: DC
  Administered 2018-07-09: 5 mg via ORAL
  Filled 2018-07-08 (×2): qty 1

## 2018-07-08 MED ORDER — SODIUM CHLORIDE 0.9% FLUSH: 3.0000 mL | INTRAVENOUS | Status: DC | PRN

## 2018-07-08 MED ORDER — IOPAMIDOL (ISOVUE-300) INJECTION 61%
INTRAVENOUS | Status: DC | PRN
  Administered 2018-07-08: 180 mL via INTRA_ARTERIAL

## 2018-07-08 MED ORDER — MIDAZOLAM HCL 2 MG/2ML IJ SOLN
INTRAMUSCULAR | Status: AC
  Filled 2018-07-08: qty 2

## 2018-07-08 MED ORDER — TICAGRELOR 90 MG PO TABS
ORAL_TABLET | ORAL | Status: AC
  Filled 2018-07-08: qty 2

## 2018-07-08 MED ORDER — TICAGRELOR 90 MG PO TABS
ORAL_TABLET | ORAL | Status: DC | PRN
  Administered 2018-07-08: 180 mg via ORAL

## 2018-07-08 MED ORDER — ASPIRIN 81 MG PO CHEW
81.0000 mg | CHEWABLE_TABLET | Freq: Once | ORAL | Status: AC
  Administered 2018-07-08: 81 mg via ORAL
  Filled 2018-07-08: qty 1

## 2018-07-08 MED ORDER — HEPARIN (PORCINE) IN NACL 1000-0.9 UT/500ML-% IV SOLN
INTRAVENOUS | Status: AC
  Filled 2018-07-08: qty 1000

## 2018-07-08 MED ORDER — ASPIRIN 81 MG PO CHEW
CHEWABLE_TABLET | ORAL | Status: DC | PRN
  Administered 2018-07-08: 243 mg via ORAL

## 2018-07-08 MED ORDER — HEPARIN SODIUM (PORCINE) 1000 UNIT/ML IJ SOLN
INTRAMUSCULAR | Status: AC
  Filled 2018-07-08: qty 1

## 2018-07-08 MED ORDER — NITROGLYCERIN 1 MG/10 ML FOR IR/CATH LAB
INTRA_ARTERIAL | Status: DC | PRN
  Administered 2018-07-08: 300 ug via INTRACORONARY

## 2018-07-08 MED ORDER — FENTANYL CITRATE (PF) 100 MCG/2ML IJ SOLN
INTRAMUSCULAR | Status: AC
  Filled 2018-07-08: qty 2

## 2018-07-08 MED ORDER — AMLODIPINE BESYLATE 5 MG PO TABS
ORAL_TABLET | ORAL | Status: AC
  Filled 2018-07-08: qty 1

## 2018-07-08 MED ORDER — VERAPAMIL HCL 2.5 MG/ML IV SOLN
INTRAVENOUS | Status: AC
  Filled 2018-07-08: qty 2

## 2018-07-08 MED ORDER — SODIUM CHLORIDE 0.9 % IV SOLN
INTRAVENOUS | Status: DC
  Administered 2018-07-08: 07:00:00 via INTRAVENOUS

## 2018-07-08 MED ORDER — LIDOCAINE HCL (PF) 1 % IJ SOLN
INTRAMUSCULAR | Status: AC
  Filled 2018-07-08: qty 30

## 2018-07-08 MED ORDER — SODIUM CHLORIDE 0.9% FLUSH
3.0000 mL | Freq: Two times a day (BID) | INTRAVENOUS | Status: DC
  Administered 2018-07-08 – 2018-07-09 (×2): 3 mL via INTRAVENOUS

## 2018-07-08 MED ORDER — HEPARIN SODIUM (PORCINE) 1000 UNIT/ML IJ SOLN
INTRAMUSCULAR | Status: DC | PRN
  Administered 2018-07-08 (×2): 6000 [IU] via INTRAVENOUS

## 2018-07-08 MED ORDER — TICAGRELOR 90 MG PO TABS
90.0000 mg | ORAL_TABLET | Freq: Two times a day (BID) | ORAL | Status: DC
  Administered 2018-07-08 – 2018-07-09 (×2): 90 mg via ORAL
  Filled 2018-07-08 (×2): qty 1

## 2018-07-08 MED ORDER — TICAGRELOR 90 MG PO TABS: 90.0000 mg | ORAL_TABLET | Freq: Two times a day (BID) | ORAL | Status: DC

## 2018-07-08 MED ORDER — SODIUM CHLORIDE 0.9 % WEIGHT BASED INFUSION: 1.0000 mL/kg/h | INTRAVENOUS | Status: AC

## 2018-07-08 MED ORDER — ASPIRIN 81 MG PO CHEW
CHEWABLE_TABLET | ORAL | Status: AC
  Filled 2018-07-08: qty 3

## 2018-07-08 MED ORDER — AMLODIPINE BESYLATE 5 MG PO TABS
5.0000 mg | ORAL_TABLET | Freq: Every day | ORAL | Status: DC
  Administered 2018-07-08 – 2018-07-09 (×2): 5 mg via ORAL
  Filled 2018-07-08: qty 1

## 2018-07-08 MED ORDER — SODIUM CHLORIDE 0.9% FLUSH
3.0000 mL | Freq: Two times a day (BID) | INTRAVENOUS | Status: DC
  Administered 2018-07-08: 3 mL via INTRAVENOUS

## 2018-07-08 SURGICAL SUPPLY — 18 items
BALLN TREK RX 2.5X12 (BALLOONS) ×3
BALLOON TREK RX 2.5X12 (BALLOONS) IMPLANT
CATH INFINITI 5FR AL1 (CATHETERS) ×2 IMPLANT
CATH INFINITI 5FR ANG PIGTAIL (CATHETERS) ×2 IMPLANT
CATH INFINITI 5FR JK (CATHETERS) ×2 IMPLANT
CATH INFINITI JR4 5F (CATHETERS) ×2 IMPLANT
CATH LAUNCHER 6FR EBU 4 (CATHETERS) ×2 IMPLANT
DEVICE INFLAT 30 PLUS (MISCELLANEOUS) ×2 IMPLANT
DEVICE RAD COMP TR BAND LRG (VASCULAR PRODUCTS) ×2 IMPLANT
KIT MANI 3VAL PERCEP (MISCELLANEOUS) ×3 IMPLANT
NDL PERC 21GX4CM (NEEDLE) IMPLANT
NEEDLE PERC 21GX4CM (NEEDLE) ×3 IMPLANT
PACK CARDIAC CATH (CUSTOM PROCEDURE TRAY) ×3 IMPLANT
SHEATH RAIN RADIAL 21G 6FR (SHEATH) ×2 IMPLANT
STENT RESOLUTE ONYX 2.5X15 (Permanent Stent) ×2 IMPLANT
WIRE EMERALD ST .035X150CM (WIRE) ×2 IMPLANT
WIRE INTUITION PROPEL ST 180CM (WIRE) ×2 IMPLANT
WIRE ROSEN-J .035X260CM (WIRE) ×2 IMPLANT

## 2018-07-08 NOTE — Progress Notes (Signed)
ANTICOAGULATION CONSULT NOTE - Follow Up Consult  Pharmacy Consult for Heparin  Indication: chest pain/ACS  Allergies  Allergen Reactions  . Ampicillin Itching  . Capsicum Swelling    Paprika and black pepper both cause swelling of the lips    Patient Measurements: Height: 6\' 2"  (188 cm) Weight: 277 lb (125.6 kg) IBW/kg (Calculated) : 82.2 Heparin Dosing Weight: 110 kg   Vital Signs: Temp: 98.7 F (37.1 C) (07/15 0343) Temp Source: Oral (07/14 1919) BP: 123/73 (07/15 0343) Pulse Rate: 75 (07/15 0343)  Labs: Recent Labs    07/05/18 1241 07/05/18 1407  07/05/18 2244 07/06/18 0530  07/06/18 1811 07/07/18 0447 07/07/18 2226 07/08/18 0413  HGB 17.0  --   --   --  14.8  --   --  15.1  --  16.1  HCT 49.5  --   --   --  42.0  --   --  43.6  --  46.4  PLT 179  --   --   --  164  --   --  161  --  175  APTT  --  29  --   --   --   --   --   --   --   --   LABPROT  --  13.1  --   --   --   --   --   --   --   --   INR  --  1.00  --   --   --   --   --   --   --   --   HEPARINUNFRC  --   --    < > 0.12* 0.26*   < > 0.37 0.36  --  0.30  CREATININE 1.21  --   --   --  1.03  --   --   --   --   --   TROPONINI 0.09*  --    < > 1.04* 1.20*  --   --   --  0.63*  --    < > = values in this interval not displayed.    Estimated Creatinine Clearance: 94 mL/min (by C-G formula based on SCr of 1.03 mg/dL).   Medications:  Medications Prior to Admission  Medication Sig Dispense Refill Last Dose  . albuterol (PROVENTIL HFA;VENTOLIN HFA) 108 (90 Base) MCG/ACT inhaler Inhale into the lungs every 6 (six) hours as needed for wheezing or shortness of breath.   Taking  . aspirin 81 MG tablet Take 81 mg by mouth daily.   Taking  . carvedilol (COREG) 3.125 MG tablet Take 1 tablet (3.125 mg total) by mouth 2 (two) times daily. 180 tablet 3   . chlorthalidone (HYGROTON) 25 MG tablet Take 25 mg by mouth daily.  4   . doxazosin (CARDURA) 2 MG tablet Take 2 mg by mouth daily.    Taking  .  Evolocumab (REPATHA SURECLICK) 140 MG/ML SOAJ Inject 1 pen into the skin every 14 (fourteen) days. 6 pen 3 Taking  . fenofibrate (TRICOR) 145 MG tablet Take 145 mg by mouth daily.    Taking  . finasteride (PROSCAR) 5 MG tablet Take 5 mg by mouth daily.    Taking  . ibuprofen (ADVIL,MOTRIN) 800 MG tablet Take 800 mg by mouth daily as needed.   Taking  . losartan (COZAAR) 100 MG tablet Take 100 mg by mouth daily.    Taking  . senna-docusate (SENOKOT S) 8.6-50 MG tablet Take  1 tablet by mouth 2 (two) times daily as needed for mild constipation.   Taking  . tamsulosin (FLOMAX) 0.4 MG CAPS capsule Take 1 capsule by mouth daily.  11    Pharmacy consulted for heparin drip dosing and monitoring for ACS/Chest Pain in 72yo male. No anticoagulants reported PTA.    Goal of Therapy:  Heparin level 0.3-0.7 units/ml Monitor platelets by anticoagulation protocol: Yes   Assessment/Plan:  7/13 1811 HL 0.37. Level therapeutic x 2. Will continue current infusion rate of 1900 units/hr. Will continue to check HL and CBC with AM labs per protocol.   7/14 :  HL @ 0447 = 0.36 Will continue this pt on current rate and recheck HL on 7/15 with AM labs.   7/15:  HL @ 0413 = 0.30 Will continue this pt on current rate and recheck HL on 7/16 with AM labs.   Scherrie Gerlach, PharmD Clinical Pharmacist 07/08/2018 5:19 AM

## 2018-07-08 NOTE — Interval H&P Note (Signed)
Cath Lab Visit (complete for each Cath Lab visit)  Clinical Evaluation Leading to the Procedure:   ACS: Yes.   NSTEMI  Non-ACS:  n/a    History and Physical Interval Note:  07/08/2018 10:04 AM  Kurt Kelley  has presented today for surgery, with the diagnosis of non ST elevated [id number]  The various methods of treatment have been discussed with the patient and family. After consideration of risks, benefits and other options for treatment, the patient has consented to  Procedure(s): LEFT HEART CATH AND CORONARY ANGIOGRAPHY (N/A) as a surgical intervention .  The patient's history has been reviewed, patient examined, no change in status, stable for surgery.  I have reviewed the patient's chart and labs.  Questions were answered to the patient's satisfaction.     Lorine Bears

## 2018-07-08 NOTE — Care Management Important Message (Signed)
Copy of signed IM left in patient's room (out for procedure). 

## 2018-07-08 NOTE — Plan of Care (Signed)
  Problem: Education: Goal: Knowledge of General Education information will improve Outcome: Progressing   Problem: Health Behavior/Discharge Planning: Goal: Ability to manage health-related needs will improve Outcome: Progressing   

## 2018-07-08 NOTE — Plan of Care (Signed)
  Problem: Education: Goal: Knowledge of General Education information will improve Outcome: Progressing   Problem: Health Behavior/Discharge Planning: Goal: Ability to manage health-related needs will improve Outcome: Progressing   Problem: Pain Managment: Goal: General experience of comfort will improve Outcome: Progressing   Problem: Cardiac: Goal: Ability to achieve and maintain adequate cardiopulmonary perfusion will improve Outcome: Progressing

## 2018-07-08 NOTE — Progress Notes (Signed)
Hospitalist at bedside speaking with pt

## 2018-07-08 NOTE — Progress Notes (Signed)
[facility] - [place] at [facility]   PATIENT NAME: Kurt Kelley    MR#:  [id number]  DATE OF BIRTH:  Sep 29, 1946  SUBJECTIVE:   Patient here due to chest pain and ruled in for a non-ST elevation MI.  Patient status post cardiac catheterization today with significant one-vessel coronary artery disease with previous stents patent.  Patient is status post drug-eluting stent to the circumflex.  Patient seen post procedure and denies any complaints.  REVIEW OF SYSTEMS:    Review of Systems  Constitutional: Negative for chills and fever.  HENT: Negative for congestion and tinnitus.   Eyes: Negative for blurred vision and double vision.  Respiratory: Negative for cough, shortness of breath and wheezing.   Cardiovascular: Negative for chest pain, orthopnea and PND.  Gastrointestinal: Negative for abdominal pain, diarrhea, nausea and vomiting.  Genitourinary: Negative for dysuria and hematuria.  Neurological: Negative for dizziness, sensory change and focal weakness.  All other systems reviewed and are negative.   Nutrition: Heart Healthy Tolerating Diet: Yes Tolerating PT: Await Eval  DRUG ALLERGIES:   Allergies  Allergen Reactions  . Ampicillin Itching  . Capsicum Swelling    Paprika and black pepper both cause swelling of the lips    VITALS:  Blood pressure (!) 144/76, pulse 72, temperature 97.8 F (36.6 C), temperature source Oral, resp. rate 18, height 6\' 2"  (1.88 m), weight 125.6 kg (277 lb), SpO2 95 %.  PHYSICAL EXAMINATION:   Physical Exam  GENERAL:  72 y.o.-year-old obese patient lying in bed in no acute distress.  EYES: Pupils equal, round, reactive to light and accommodation. No scleral icterus. Extraocular muscles intact.  HEENT: Head atraumatic, normocephalic. Oropharynx and nasopharynx clear.  NECK:  Supple, no jugular venous distention. No thyroid enlargement, no tenderness.  LUNGS: Normal breath sounds bilaterally, no wheezing, rales, rhonchi.  No use of accessory muscles of respiration.  CARDIOVASCULAR: S1, S2 normal. No murmurs, rubs, or gallops.  ABDOMEN: Soft, nontender, nondistended. Bowel sounds present. No organomegaly or mass.  EXTREMITIES: No cyanosis, clubbing or edema b/l.    NEUROLOGIC: Cranial nerves II through XII are intact. No focal Motor or sensory deficits b/l.   PSYCHIATRIC: The patient is alert and oriented x 3.  SKIN: No obvious rash, lesion, or ulcer.    LABORATORY PANEL:   CBC Recent Labs  Lab 07/08/18 0413  WBC 6.5  HGB 16.1  HCT 46.4  PLT 175   ------------------------------------------------------------------------------------------------------------------  Chemistries  Recent Labs  Lab 07/05/18 1655 07/06/18 0530  NA  --  141  K  --  4.1  CL  --  107  CO2  --  26  GLUCOSE  --  145*  BUN  --  16  CREATININE  --  1.03  CALCIUM  --  9.1  MG 2.1  --    ------------------------------------------------------------------------------------------------------------------  Cardiac Enzymes Recent Labs  Lab 07/07/18 2226  TROPONINI 0.63*   ------------------------------------------------------------------------------------------------------------------  RADIOLOGY:  No results found.   ASSESSMENT AND PLAN:   JerryMurrayis a71 y.o.malewith a known history of ischemic cardiomyopathy, chronic systolic mild congestive heart failure, coronary artery disease status post stent times one in 2003, peripheral vascular disease and sleep apnea comes to the emergency room after he started having chest pain which was progressively increasing. Patient said he mowed his lawn two days ago felt very exhausted became diaphoretic started having chest pain  1.acute NSTEMI/unstable angina patient with known coronary artery disease status post stent in 2003 -Patient's troponins trended upwards.  Patient  is status post cardiac catheterization today with significant one-vessel coronary artery disease with  previous stents being patent.  Patient is status post drug-eluting stent to the left circumflex. -Continue aspirin, Brilinta.  Continue Imdur, losartan.  Patient is intolerant to beta-blockers and statins. -Continue further care as per cardiology.  2.uncontrolled type II diabetes--new -Patient's hemoglobin A1c is as high as 7.5.  Continue Tradjenta - cont. SSI   3.Malignant hypertension - BP improved.  - cont. Losartan, Imdur, Chlorthalidone, Norvasc, PRN labetolol.   4.Hyperlipidemia intolerant to statins - cont. SQ Repatha, cont. Fenofibrate  5.Morbid obesity with obstructive sleep apnea - cont. CPAP at bedtime  6.  BPH-continue finasteride, doxazosin, Flomax.     All the records are reviewed and case discussed with Care Management/Social Worker. Management plans discussed with the patient, family and they are in agreement.  CODE STATUS: Full code  DVT Prophylaxis: Lovenox  TOTAL TIME TAKING CARE OF THIS PATIENT: 30 minutes.   POSSIBLE D/C IN 1-2 DAYS, DEPENDING ON CLINICAL CONDITION.   Houston Siren M.D on 07/08/2018 at 3:47 PM  Between 7am to 6pm - Pager - [phone]  After 6pm go to [email or web address] - Social research officer, government  [facility] [place] [facility]  Office  [phone]  CC: Primary care physician; Jerl Mina, MD

## 2018-07-09 ENCOUNTER — Telehealth: Payer: Self-pay | Admitting: Cardiovascular Disease

## 2018-07-09 LAB — CBC
HCT: 46.8 % (ref 40.0–52.0)
Hemoglobin: 16.5 g/dL (ref 13.0–18.0)
MCH: 29.6 pg (ref 26.0–34.0)
MCHC: 35.3 g/dL (ref 32.0–36.0)
MCV: 84 fL (ref 80.0–100.0)
PLATELETS: 182 10*3/uL (ref 150–440)
RBC: 5.57 MIL/uL (ref 4.40–5.90)
RDW: 14.2 % (ref 11.5–14.5)
WBC: 8 10*3/uL (ref 3.8–10.6)

## 2018-07-09 LAB — BASIC METABOLIC PANEL
Anion gap: 9 (ref 5–15)
BUN: 18 mg/dL (ref 8–23)
CALCIUM: 9.5 mg/dL (ref 8.9–10.3)
CHLORIDE: 105 mmol/L (ref 98–111)
CO2: 26 mmol/L (ref 22–32)
CREATININE: 1.29 mg/dL — AB (ref 0.61–1.24)
GFR calc non Af Amer: 55 mL/min — ABNORMAL LOW (ref >60)
Glucose, Bld: 132 mg/dL — ABNORMAL HIGH (ref 70–99)
Potassium: 3.8 mmol/L (ref 3.5–5.1)
SODIUM: 140 mmol/L (ref 135–145)

## 2018-07-09 LAB — GLUCOSE, CAPILLARY
GLUCOSE-CAPILLARY: 117 mg/dL — AB (ref 70–99)
GLUCOSE-CAPILLARY: 118 mg/dL — AB (ref 70–99)

## 2018-07-09 MED ORDER — TICAGRELOR 90 MG PO TABS: 90.0000 mg | ORAL_TABLET | Freq: Two times a day (BID) | ORAL | 1 refills | Status: DC

## 2018-07-09 MED ORDER — ISOSORBIDE MONONITRATE ER 30 MG PO TB24: 30.0000 mg | ORAL_TABLET | Freq: Every day | ORAL | 1 refills | Status: DC

## 2018-07-09 MED ORDER — PREMIER PROTEIN SHAKE: 11.0000 [oz_av] | Freq: Two times a day (BID) | ORAL | Status: DC

## 2018-07-09 MED ORDER — AMLODIPINE BESYLATE 5 MG PO TABS: 5.0000 mg | ORAL_TABLET | Freq: Every day | ORAL | 1 refills | Status: DC

## 2018-07-09 NOTE — Discharge Summary (Signed)
[facility] - [place] at [facility]   PATIENT NAME: Kurt Kelley    MR#:  [id number]  DATE OF BIRTH:  [birth year]  DATE OF ADMISSION:  07/05/2018 ADMITTING PHYSICIAN: Enedina Finner, MD  DATE OF DISCHARGE: 07/09/2018 12:56 PM  PRIMARY CARE PHYSICIAN: Jerl Mina, MD    ADMISSION DIAGNOSIS:  NSTEMI (non-ST elevated myocardial infarction) (HCC) [I21.4]  DISCHARGE DIAGNOSIS:  Principal Problem:   NSTEMI (non-ST elevated myocardial infarction) (HCC) Active Problems:   Syncope and collapse   CAD S/P percutaneous coronary angioplasty   Essential hypertension   Moderate aortic stenosis   SECONDARY DIAGNOSIS:   Past Medical History:  Diagnosis Date  . Aortic stenosis    a. eCHO 04/2018: EF to 50-55%, no RWMA, Gr1DD, mild to moderate aortic stenosis with mild aortic insufficiency, mildly dilated left atrium, RVSF normal, PASP mildly elevated  . Arthritis    lower back, right knee  . CAD S/P percutaneous coronary angioplasty 2003   a. remote PCI in 2003; b. Myoview 10/18 no ischemia, EF 39%  . HLD (hyperlipidemia)    a. intolerant to statins  . Hypertension   . Ischemic cardiomyopathy    MILD -- ECHO 04/2018: a. EF to 50-55%, no RWMA, Gr1DD, mild to moderate aortic stenosis with mild aortic insufficiency, mildly dilated left atrium, RVSF normal, PASP mildly elevated  . PAD (peripheral artery disease) (HCC)   . Sleep apnea    a. intolerant of CPAP    HOSPITAL COURSE:   JerryMurrayis a72 y.o.malewith a known history of ischemic cardiomyopathy, chronic systolic mild congestive heart failure, coronary artery disease status post stent times one in 2003, peripheral vascular disease and sleep apnea comes to the emergency room after he started having chest pain which was progressively increasing. Patient said he mowed his lawn two days ago felt very exhausted became diaphoretic started having chest pain  1.acute NSTEMI/unstable angina patient with known coronary  artery disease status post stent in 2003 -Patient's troponins trended upwards.  Seen by Cardiology and is  Patient is status post cardiac catheterization with significant one-vessel coronary artery disease with previous stents being patent.  Patient is status post drug-eluting stent to the left circumflex which was though to be the culprit vessel.  -Post cardiac catheterization patient has been chest pain-free and hemodynamically stable.  He is apparently intolerant to beta-blockers and statins.  He is being discharged on aspirin, Brilinta low-dose Imdur, losartan - he will follow-up with cardiology in the next 2 weeks.  2.uncontrolled type II diabetes-patient's blood sugars were somewhat elevated while in the hospital.  His A1c was noted to be as high as 7.5.  While in the hospital patient was on sliding scale insulin and he was started on Tradjenta.  Patient's wife and husband wanted to see his primary care physician and decide on oral hypoglycemics.  As per the patient's wife patient diet has been very poor the past few weeks prior to coming to the hospital.  They wanted to try diet before starting medications.  This is to be further addressed by his primary care physician Dr. Burnett Sheng  3.Malignant hypertension -Morrie Sheldon patient's blood pressure was somewhat uncontrolled while in the hospital and he received some as needed IV labetalol and hydralazine.  This has significantly improved since admission. - Patient will continue Imdur, Norvasc, losartan upon discharge.  4.Hyperlipidemia intolerant to statins - he will cont. SQRepatha, cont. Fenofibrate  5.Morbid obesity with obstructive sleep apnea - cont. CPAP at bedtime  6.  BPH-continue finasteride, doxazosin,  Flomax.  DISCHARGE CONDITIONS:   Stable  CONSULTS OBTAINED:  Treatment Team:  Antonieta Iba, MD  DRUG ALLERGIES:   Allergies  Allergen Reactions  . Ampicillin Itching  . Capsicum Swelling    Paprika and black pepper  both cause swelling of the lips    DISCHARGE MEDICATIONS:   Allergies as of 07/09/2018      Reactions   Ampicillin Itching   Capsicum Swelling   Paprika and black pepper both cause swelling of the lips      Medication List    STOP taking these medications   carvedilol 3.125 MG tablet Commonly known as:  COREG   chlorthalidone 25 MG tablet Commonly known as:  HYGROTON     TAKE these medications   albuterol 108 (90 Base) MCG/ACT inhaler Commonly known as:  PROVENTIL HFA;VENTOLIN HFA Inhale into the lungs every 6 (six) hours as needed for wheezing or shortness of breath.   amLODipine 5 MG tablet Commonly known as:  NORVASC Take 1 tablet (5 mg total) by mouth daily. Start taking on:  07/10/2018   aspirin 81 MG tablet Take 81 mg by mouth daily.   doxazosin 2 MG tablet Commonly known as:  CARDURA Take 2 mg by mouth daily.   Evolocumab 140 MG/ML Soaj Commonly known as:  REPATHA SURECLICK Inject 1 pen into the skin every 14 (fourteen) days.   fenofibrate 145 MG tablet Commonly known as:  TRICOR Take 145 mg by mouth daily.   finasteride 5 MG tablet Commonly known as:  PROSCAR Take 5 mg by mouth daily.   ibuprofen 800 MG tablet Commonly known as:  ADVIL,MOTRIN Take 800 mg by mouth daily as needed.   isosorbide mononitrate 30 MG 24 hr tablet Commonly known as:  IMDUR Take 1 tablet (30 mg total) by mouth daily. Start taking on:  07/10/2018   losartan 100 MG tablet Commonly known as:  COZAAR Take 100 mg by mouth daily.   SENOKOT S 8.6-50 MG tablet Generic drug:  senna-docusate Take 1 tablet by mouth 2 (two) times daily as needed for mild constipation.   tamsulosin 0.4 MG Caps capsule Commonly known as:  FLOMAX Take 1 capsule by mouth daily.   ticagrelor 90 MG Tabs tablet Commonly known as:  BRILINTA Take 1 tablet (90 mg total) by mouth 2 (two) times daily.         DISCHARGE INSTRUCTIONS:   DIET:  Cardiac diet and Diabetic diet  DISCHARGE  CONDITION:  Stable  ACTIVITY:  Activity as tolerated  OXYGEN:  Home Oxygen: Yes.     Oxygen Delivery: room air  DISCHARGE LOCATION:  home   If you experience worsening of your admission symptoms, develop shortness of breath, life threatening emergency, suicidal or homicidal thoughts you must seek medical attention immediately by calling 911 or calling your MD immediately  if symptoms less severe.  You Must read complete instructions/literature along with all the possible adverse reactions/side effects for all the Medicines you take and that have been prescribed to you. Take any new Medicines after you have completely understood and accpet all the possible adverse reactions/side effects.   Please note  You were cared for by a hospitalist during your hospital stay. If you have any questions about your discharge medications or the care you received while you were in the hospital after you are discharged, you can call the unit and asked to speak with the hospitalist on call if the hospitalist that took care of you is not available.  Once you are discharged, your primary care physician will handle any further medical issues. Please note that NO REFILLS for any discharge medications will be authorized once you are discharged, as it is imperative that you return to your primary care physician (or establish a relationship with a primary care physician if you do not have one) for your aftercare needs so that they can reassess your need for medications and monitor your lab values.     Today   No acute complaints presently.  No chest Pain.    VITAL SIGNS:  Blood pressure 128/80, pulse 80, temperature 97.6 F (36.4 C), temperature source Oral, resp. rate 16, height 6\' 2"  (1.88 m), weight 123.9 kg (273 lb 1.6 oz), SpO2 97 %.  I/O:    Intake/Output Summary (Last 24 hours) at 07/09/2018 1535 Last data filed at 07/09/2018 1021 Gross per 24 hour  Intake 1761.62 ml  Output 575 ml  Net 1186.62 ml     PHYSICAL EXAMINATION:   GENERAL:  72 y.o.-year-old obese patient lying in bed in no acute distress.  EYES: Pupils equal, round, reactive to light and accommodation. No scleral icterus. Extraocular muscles intact.  HEENT: Head atraumatic, normocephalic. Oropharynx and nasopharynx clear.  NECK:  Supple, no jugular venous distention. No thyroid enlargement, no tenderness.  LUNGS: Normal breath sounds bilaterally, no wheezing, rales, rhonchi. No use of accessory muscles of respiration.  CARDIOVASCULAR: S1, S2 normal. No murmurs, rubs, or gallops.  ABDOMEN: Soft, nontender, nondistended. Bowel sounds present. No organomegaly or mass.  EXTREMITIES: No cyanosis, clubbing or edema b/l.    NEUROLOGIC: Cranial nerves II through XII are intact. No focal Motor or sensory deficits b/l.   PSYCHIATRIC: The patient is alert and oriented x 3.  SKIN: No obvious rash, lesion, or ulcer.    DATA REVIEW:   CBC Recent Labs  Lab 07/09/18 0506  WBC 8.0  HGB 16.5  HCT 46.8  PLT 182    Chemistries  Recent Labs  Lab 07/05/18 1655  07/09/18 0506  NA  --    < > 140  K  --    < > 3.8  CL  --    < > 105  CO2  --    < > 26  GLUCOSE  --    < > 132*  BUN  --    < > 18  CREATININE  --    < > 1.29*  CALCIUM  --    < > 9.5  MG 2.1  --   --    < > = values in this interval not displayed.    Cardiac Enzymes Recent Labs  Lab 07/07/18 2226  TROPONINI 0.63*    Microbiology Results  No results found for this or any previous visit.  RADIOLOGY:  No results found.    Management plans discussed with the patient, family and they are in agreement.  CODE STATUS:     Code Status Orders  (From admission, onward)        Start     Ordered   07/05/18 1628  Full code  Continuous     07/05/18 1627      TOTAL TIME TAKING CARE OF THIS PATIENT: 40 minutes.    Houston Siren M.D on 07/09/2018 at 3:35 PM  Between 7am to 6pm - Pager - [phone]  After 6pm go to [email or web address] - Geophysicist/field seismologist  [facility] [place] [facility]  Office  [phone]  CC: Primary care physician; Jerl Mina, MD

## 2018-07-09 NOTE — Telephone Encounter (Signed)
Patient contacted regarding discharge from [facility] on 07/09/18.   Patient understands to follow up with provider ? On 07/23/18 at 3:30pm at [facility].  Patient understands discharge instructions? Yes Patient understands medications and regiment? Yes  Patient understands to bring all medications to this visit? Yes

## 2018-07-09 NOTE — Telephone Encounter (Signed)
[facility] for nstemi s/p cath   Scheduled 7/30 at 330 with Ryan   Added to waitlist

## 2018-07-09 NOTE — Progress Notes (Signed)
Progress Note  Patient Name: Kurt Kelley Date of Encounter: 07/09/2018  Primary Cardiologist: Lorine Bears, MD   Subjective   Patient without chest pain and feeling well today, stating he is a "new man." He and his wife are grateful and "ready to go home."  Of note, patient reports intolerance to BB, reporting joint pain as a side effect. Pt also with reported statin intolerance.  Pt advised to wait 3-5 days before riding his motorcycle, given recent cardiac catheterization and to allow right wrist time to heal.    Vital Signs    Vitals:   07/08/18 1753 07/08/18 1932 07/09/18 0329 07/09/18 0831  BP: (!) 156/77 139/65 135/60 128/80  Pulse: 84 76 75 80  Resp:  18 18 16   Temp:  97.8 F (36.6 C) 98.3 F (36.8 C) 97.6 F (36.4 C)  TempSrc:  Oral  Oral  SpO2: 98% 96% 96% 97%  Weight:   273 lb 1.6 oz (123.9 kg)   Height:        Intake/Output Summary (Last 24 hours) at 07/09/2018 1738 Last data filed at 07/09/2018 1021 Gross per 24 hour  Intake 720 ml  Output 450 ml  Net 270 ml   Filed Weights   07/08/18 0343 07/08/18 0840 07/09/18 0329  Weight: 277 lb (125.6 kg) 277 lb (125.6 kg) 273 lb 1.6 oz (123.9 kg)   Physical Exam   Physical Exam  Constitutional: He is oriented to person, place, and time. He appears well-developed and well-nourished. No distress.  HENT:  Head: Normocephalic and atraumatic.  Neck: Normal range of motion.  Cardiovascular: Normal rate, regular rhythm and intact distal pulses.  Murmur heard. Ao stenosis  Pulmonary/Chest: Effort normal and breath sounds normal. No respiratory distress. He has no wheezes. He has no rales.  Abdominal: Bowel sounds are normal. He exhibits no mass. There is no tenderness. There is no guarding.  Musculoskeletal: Normal range of motion. He exhibits no edema.  LE edema improved since cath  Neurological: He is alert and oriented to person, place, and time.  Skin: Skin is warm and dry. No rash noted. He is not  diaphoretic. No erythema. No pallor.  Incision site from R radial cath    Labs    Chemistry Recent Labs  Lab 07/05/18 1241 07/06/18 0530 07/09/18 0506  NA 137 141 140  K 3.9 4.1 3.8  CL 106 107 105  CO2 21* 26 26  GLUCOSE 281* 145* 132*  BUN 20 16 18   CREATININE 1.21 1.03 1.29*  CALCIUM 9.9 9.1 9.5  GFRNONAA 59* >60 55*  GFRAA >60 >60 >60  ANIONGAP 10 8 9      Hematology Recent Labs  Lab 07/07/18 0447 07/08/18 0413 07/09/18 0506  WBC 6.7 6.5 8.0  RBC 5.21 5.52 5.57  HGB 15.1 16.1 16.5  HCT 43.6 46.4 46.8  MCV 83.7 84.1 84.0  MCH 29.0 29.1 29.6  MCHC 34.7 34.6 35.3  RDW 13.9 13.8 14.2  PLT 161 175 182    Cardiac Enzymes Recent Labs  Lab 07/05/18 1655 07/05/18 2244 07/06/18 0530 07/07/18 2226  TROPONINI 0.32* 1.04* 1.20* 0.63*   No results for input(s): TROPIPOC in the last 168 hours.   BNPNo results for input(s): BNP, PROBNP in the last 168 hours.   DDimer No results for input(s): DDIMER in the last 168 hours.   Radiology    No results found.  Cardiac Studies   Echo 04/2018: Study Conclusions - Left ventricle: The cavity size was normal.  Systolic function was normal. The estimated ejection fraction was in the range of 50% to 55%. Wall motion was normal; there were no regional wall motion abnormalities. Doppler parameters are consistent with abnormal left ventricular relaxation (grade 1 diastolic dysfunction). - Aortic valve: There was mild to moderate stenosis. There was mild regurgitation. Peak velocity (S): 342 cm/s. Mean gradient (S): 24 mm Hg. - Left atrium: The atrium was mildly dilated. - Right ventricle: Systolic function was normal. - Pulmonary arteries: Systolic pressure was mildly elevated.  Myoview 09/2017: LVEF= 39% FINDINGS: Regional wall motion:reveals normal myocardial thickening and wall  motion. The overall quality of the study is fair. Artifacts noted: yes Left ventricular cavity:  normal.  Perfusion Analysis:SPECT images demonstrate homogeneous tracer  distribution throughout the myocardium.  7/15 Left Heart Catheterization and Coronary Angiography 1. Significant underlying two-vessel coronary artery disease with patent proximal RCA stent.  There is a 95% stenosis in the mid left circumflex which seems to be the culprit for non-ST elevation myocardial infarction.  There is also a 60% stenosis in the mid to distal right coronary artery. 2.  Moderate aortic stenosis with a peak to peak gradient of 15 to 20 mmHg. 3.  Low normal LV systolic function with an EF of 50 to 55%. mildly elevated left ventricular end-diastolic pressure at 18 mmHg.  4.  Successful angioplasty and drug-eluting stent placement to the left circumflex.   Patient Profile     72 y.o. male with known history of ICM, HFrEF, CAD s/p stent x1 (2003), HTN, HLD intolerant to statins, DM, Mild - moderate Ao stenosis, PVD, and OSA that presented to [facility] ED with worsening CP, diaphoresis, and HBP after mowing his lawn 2 days prior.   Assessment & Plan    1. NSTEMI with h/o ICM and CAD s/p PCI (2003) - Currently chest pain free and no reported shortness of breath. Presented to [facility] ED with worsening CP (x2 days) after mowing his lawn. Troponin positive x3 (0.09  0.32  1.2).  - Former smoker (quit 31.5y).  - 7/16 EKG NSR 71bpm with ST/T wave abnormalities. - 7/15 Left heart cath and coronary angio as above. Significant 2 vessel CAD with patent RCA stent and 95% stenosis to L circumflex, likely to be culprit for NSTEMI per Dr. Jari Sportsman cath notes. EF 50-55% with mildly elevated LVEDP. Moderate AS. DES to L circumflex. - Cardiac rehab and outpatient follow-up recommended - schedule. - Continue dual antiplatelet therapy x12 mo with ASA 81mg  po qd & Ticagrelor 90mg  po bid - Continue Imdur 30mg  po qd, Losartan 100mg  po qd.  - Patient reportedly intolerant to BB and statins.  - Continue Norvasc 5mg  po qd. - Hold  Chlorthalidone 25mg  po qd.  2. Syncope - likely multifactorial in setting of AS, dehydration, NSTEMI - 7/15 LHC as above. LV systolic function low normal. EF 50-55%. Mildly elevated LVEDP.  - Likely multifactorial in setting of NSTEMI / left circumflex stenosis, dehydration, and Ao stenosis.  - Monitor patient closely for any residual angina. Per Dr. Jari Sportsman [facility] 7/15 notes, recommend FFR based evaluation of RCA given vessel is very large.   3. Moderate Ao Stenosis - Per 7/15 cath results (above), moderate AS with peak to peak gradient of 15-20 mmHg. - Continue to monitor AS and maintain BP control as outpatient.  - Continue Amlodipine 5mg  po qd, Imdur 30 mg po qd. - Patient intolerant to BB and unable to take Carvedilol.  4. HLD - Intolerant to statins.  - On  Fenofibrate 160mg  po qd.  - Started on Repatha. Continue.  - Referall to OP lipid clinic on discharge for further management - Goal LDL less than 70.  5. Essential HTN  - BP 135/60-180/80 (7/15-7/16). - Hold Chlorthalidone as above.  - Pt intolerant to BB. - Continue Imdur, Losartan, Norvasc.   6. OSA in setting of morbid obesity -CPAP at bedtime  7. Uncontrolled DM2 - new diagnosis - A1C 7.5. Continue Tradjenta 5mg  po qd.  - Outpatient follow-up per PCP.  8. BPH -Continue Finasteridae 5mg  po qd, Cardura 2mg  po qd, Flomax capsule po qd - Outpatient follow-up per PCP.   For questions or updates, please contact [facility] Please consult [email or web address] for contact info under Cardiology/STEMI.      Lynelle Smoke, PA-C  Pager [phone] 07/09/2018, 5:38 PM

## 2018-07-09 NOTE — Progress Notes (Signed)
CCMD called that pt has an episode of bigemeny. Pt was asymtomatic. VSS. Notify doctor diamond but no new order was place, but just continue to monitor pt. Will continue to monitor.

## 2018-07-09 NOTE — Progress Notes (Signed)
Kurt Kelley to be D/C'd Home per MD order.  Discussed prescriptions and follow up appointments with the patient. Prescriptions given to patient, medication list explained in detail. Pt verbalized understanding.  Allergies as of 07/09/2018      Reactions   Ampicillin Itching   Capsicum Swelling   Paprika and black pepper both cause swelling of the lips      Medication List    STOP taking these medications   carvedilol 3.125 MG tablet Commonly known as:  COREG   chlorthalidone 25 MG tablet Commonly known as:  HYGROTON     TAKE these medications   albuterol 108 (90 Base) MCG/ACT inhaler Commonly known as:  PROVENTIL HFA;VENTOLIN HFA Inhale into the lungs every 6 (six) hours as needed for wheezing or shortness of breath.   amLODipine 5 MG tablet Commonly known as:  NORVASC Take 1 tablet (5 mg total) by mouth daily. Start taking on:  07/10/2018   aspirin 81 MG tablet Take 81 mg by mouth daily.   doxazosin 2 MG tablet Commonly known as:  CARDURA Take 2 mg by mouth daily.   Evolocumab 140 MG/ML Soaj Commonly known as:  REPATHA SURECLICK Inject 1 pen into the skin every 14 (fourteen) days.   fenofibrate 145 MG tablet Commonly known as:  TRICOR Take 145 mg by mouth daily.   finasteride 5 MG tablet Commonly known as:  PROSCAR Take 5 mg by mouth daily.   ibuprofen 800 MG tablet Commonly known as:  ADVIL,MOTRIN Take 800 mg by mouth daily as needed.   isosorbide mononitrate 30 MG 24 hr tablet Commonly known as:  IMDUR Take 1 tablet (30 mg total) by mouth daily. Start taking on:  07/10/2018   losartan 100 MG tablet Commonly known as:  COZAAR Take 100 mg by mouth daily.   SENOKOT S 8.6-50 MG tablet Generic drug:  senna-docusate Take 1 tablet by mouth 2 (two) times daily as needed for mild constipation.   tamsulosin 0.4 MG Caps capsule Commonly known as:  FLOMAX Take 1 capsule by mouth daily.   ticagrelor 90 MG Tabs tablet Commonly known as:  BRILINTA Take 1  tablet (90 mg total) by mouth 2 (two) times daily.       Vitals:   07/09/18 0329 07/09/18 0831  BP: 135/60 128/80  Pulse: 75 80  Resp: 18 16  Temp: 98.3 F (36.8 C) 97.6 F (36.4 C)  SpO2: 96% 97%    Tele box removed and returned. Skin clean, dry and intact without evidence of skin break down, no evidence of skin tears noted. IV catheter discontinued intact. Site without signs and symptoms of complications. Dressing and pressure applied. Pt denies pain at this time. No complaints noted.  An After Visit Summary was printed and given to the patient. Patient escorted via WC, and D/C home via private auto.  Rigoberto Noel

## 2018-07-09 NOTE — Progress Notes (Addendum)
Cardiovascular and Pulmonary Nurse Navigator Note:    72 year old male with hx of previous stent to RCA, Aortic valve stenosis, arthritis, heart murmur, HTN, and sleep apnea  who presented to the ED with chest pain and ruled in for a NSTEMI.  Patient s/p cardiac catheterization which revealed 95% stenosis to mid circumflex.  Patient underwent successful  DES to mid circumflex.    Rounded on patient.  Wife at bedside.  Patient gave permission for this RN to speak about his medical condition in front of his wife.    "Heart Attack Bouncing Back" booklet given and reviewed with patient. Discussed the definition of CAD. Reviewed the location of CAD and where his stent was placed. Informed patient he will be given a stent card. Explained the purpose of the stent card. Instructed patient to keep stent card in his wallet.  ? Discussed modifiable risk factors including controlling blood pressure, cholesterol, and blood sugar; following heart healthy diet; maintaining healthy weight; exercise; and smoking cessation, if applicable.   ? Discussed cardiac medications including rationale for taking, mechanisms of action, and side effects. Stressed the importance of taking medications as prescribed. Patient has Brilinta discount coupon.  Patient has intolerance to statins and beta blockers.  Patient on SQ Repatha and Fenofibrate.   ? Discussed emergency plan for heart attack symptoms. Patient verbalized understanding of need to call 911 and not to drive himself to ER if having cardiac symptoms / chest pain.  ? Heart healthy diet of low sodium, low fat, low cholesterol carb modified heart healthy diet discussed. Information on diet provided. Reviewed the heart-healthy consistent carbohydrate nutrition therapy handout from the Nutrition Care Manual with patient and wife.   ? Smoking Cessation - Patient is a former smoker.   ? Exercise - Benefits of exercised discussed. Patient reports he has trouble walking due to  peripheral arterial disease.  Patient has seen Dr. Kirke Corin for this problem. Patient informed this RN that his blockages in his legs are not "bad" enough for stents.   Informed patient that his cardiologist has referred him to outpatient Cardiac Rehab. An overview of the program was provided. Informational letter, class and orientation times, and CPT billing codes given to patient.  Patient initially stated he did not think he could exercise at all due to PAD.  This RN stressed the importance of remaining active and that some activity is better than no activity.     Patient plans to check with his insurance company to see what his out-of-pocket expenses will be. In addition, patient will talk with Dr. Kirke Corin about Cardiac Rehab for his heart and PAD.  Home walking plan also discussed.    Patient and wife reported that he had an overnight sleep study in the home in October of last year.  This test was ordered by his previous cardiologist.  Patient and wife stated to this day that they do not have the results of this test.  This RN looked in Care Everywhere and found the tests results.  Discussed with patient and wife.  Patient has "very severe sleep apnea" and the treatment recommendation was CPAP.  They plan to discuss this with Dr. Burnett Sheng , patient's PCP, to obtain order for CPAP.   ? Patient / wife appreciative of the information.  ? Army Melia, RN, BSN, [facility]  [place]  [facility] Cardiac & Pulmonary Rehab  Cardiovascular & Pulmonary Nurse Navigator  Direct Line: [phone]  Department Phone #: [phone] Fax: [phone]  Email  Address: [email or web address]

## 2018-07-23 ENCOUNTER — Encounter

## 2018-07-23 ENCOUNTER — Ambulatory Visit (INDEPENDENT_AMBULATORY_CARE_PROVIDER_SITE_OTHER): Payer: Medicare Other | Admitting: Physician Assistant

## 2018-07-23 ENCOUNTER — Encounter: Payer: Self-pay | Admitting: Physician Assistant

## 2018-07-23 VITALS — BP 158/76 | HR 71 | Ht 75.0 in | Wt 271.0 lb

## 2018-07-23 DIAGNOSIS — I2510 Atherosclerotic heart disease of native coronary artery without angina pectoris: Secondary | ICD-10-CM

## 2018-07-23 DIAGNOSIS — R0989 Other specified symptoms and signs involving the circulatory and respiratory systems: Secondary | ICD-10-CM

## 2018-07-23 DIAGNOSIS — I10 Essential (primary) hypertension: Secondary | ICD-10-CM

## 2018-07-23 DIAGNOSIS — R55 Syncope and collapse: Secondary | ICD-10-CM

## 2018-07-23 DIAGNOSIS — I350 Nonrheumatic aortic (valve) stenosis: Secondary | ICD-10-CM | POA: Diagnosis not present

## 2018-07-23 DIAGNOSIS — E785 Hyperlipidemia, unspecified: Secondary | ICD-10-CM

## 2018-07-23 MED ORDER — TICAGRELOR 90 MG PO TABS: 90.0000 mg | ORAL_TABLET | Freq: Two times a day (BID) | ORAL | 3 refills | Status: AC

## 2018-07-23 MED ORDER — ISOSORBIDE MONONITRATE ER 30 MG PO TB24: 30.0000 mg | ORAL_TABLET | Freq: Every day | ORAL | 3 refills | Status: DC

## 2018-07-23 MED ORDER — AMLODIPINE BESYLATE 10 MG PO TABS: 10.0000 mg | ORAL_TABLET | Freq: Every day | ORAL | 3 refills | Status: DC

## 2018-07-23 MED ORDER — NITROGLYCERIN 0.4 MG SL SUBL: 0.4000 mg | SUBLINGUAL_TABLET | SUBLINGUAL | 3 refills | Status: DC | PRN

## 2018-07-23 NOTE — Progress Notes (Signed)
Cardiology Office Note Date:  07/23/2018  Patient ID:  Kurt Kelley 1946-12-02, MRN [id number] PCP:  Jerl Mina, MD  Cardiologist:  Dr. Kirke Corin, MD    Chief Complaint: Hospital follow up  History of Present Illness: Kurt Kelley is a 72 y.o. 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  Prior nuclear stress test in 09/2017 at [facility] showed normal perfusion with an EF of 39%.  Echo in 09/2017 done at [facility] showed an EF of 40 to 45%, mild aortic stenosis with reported valve area 1.2 and a moderate mitral regurgitation.  Echocardiogram from 04/25/2018 demonstrated an improved EF to 50 to 55%, no regional wall motion and normalities, grade 1 diastolic dysfunction, mild to moderate aortic stenosis with mild aortic insufficiency, mildly dilated left atrium, RVSF normal, PASP mildly elevated.  Patient was admitted to [facility] on 7/12 following a day of prolonged lawn work with associated possible syncope.  He was noted to have a non-STEMI with a peak troponin of 1.20.  He underwent cardiac catheterization on 07/08/2018 that demonstrated left main angiographically normal, proximal LAD 20% stenosed, mid left circumflex 95% stenosed, distal left circumflex 40% stenosed, proximal RCA previously placed stent was widely patent, distal RCA 60% stenosed.  The mid circumflex lesion was felt to be the culprit and he underwent successful PCI/DES to the mid circumflex without issues.  There was moderate aortic stenosis with a peak to peak gradient of 15-20  mmHg.  Low normal LVSF with an EF of 50 to 55%. Mildly elevated LVEDP at 18 mmHg.  The patient reported an intolerance to beta-blockers and has not been able to tolerate carvedilol.  He was initiated on amlodipine post-cath.  For residual angina, FFR based evaluation of the RCA was recommended.  Discharge labs showed an unremarkable CBC, potassium 3.8, serum creatinine 1.29 (baseline approximately 1.0-1.2), LDL 28, A1c 7.5.  His syncope was felt to possibly be related to dehydration and orthostasis.  No signs of arrhythmia were noted on telemetry.  Discharge weight of 273 pounds.  Discharge medications included amlodipine 5 mg daily, aspirin 81 mg daily, Cardura 2 mg daily, Repatha, fenofibrate, Imdur, losartan, and Brilinta along with his noncardiac medications.  Patient comes in accompanied by his wife today. He is doing very well. He indicates he is "90%" better. He has not had any symptoms concerning for angina. No chest pain, palpitations, dizziness, presyncope, or syncope. He has been compliant with all medications. No issues from his cath site. He does ask that we look into the cost with his Repatha. He has cut back on his carbs. PCP is rechecking A1c in ~ 3 months. He will be participating in cardiac rehab. No BRBPR or melena. No symptoms of claudication.   Past Medical History:  Diagnosis Date  . Aortic stenosis    a. eCHO 04/2018: EF to 50-55%, no RWMA, Gr1DD, mild to moderate aortic stenosis with mild aortic insufficiency, mildly dilated left atrium, RVSF normal, PASP mildly elevated  . Arthritis    lower back, right knee  . CAD S/P percutaneous  coronary angioplasty 2003   a. remote PCI in 2003; b. Myoview 10/18 no ischemia, EF 39%  . HLD (hyperlipidemia)    a. intolerant to statins  . Hypertension   . Ischemic cardiomyopathy    MILD -- ECHO 04/2018: a. EF to 50-55%, no RWMA, Gr1DD, mild to moderate aortic stenosis with mild aortic insufficiency, mildly dilated left atrium, RVSF normal, PASP  mildly elevated  . PAD (peripheral artery disease) (HCC)   . Sleep apnea    a. intolerant of CPAP    Past Surgical History:  Procedure Laterality Date  . CARDIAC CATHETERIZATION  2003   stent  . CARPAL TUNNEL RELEASE Right 10/24/2017   Procedure: CARPAL TUNNEL RELEASE ENDOSCOPIC;  Surgeon: Christena Flake, MD;  Location: [facility];  Service: Orthopedics;  Laterality: Right;  sleep apnea  . CERVICAL SPINE SURGERY Right 01/23/2017   arthrodesis, discectomy, osteophytectomy, decompression  C3-C4, [facility]  . COLONOSCOPY    . CORONARY STENT INTERVENTION N/A 07/08/2018   Procedure: CORONARY STENT INTERVENTION;  Surgeon: Iran Ouch, MD;  Location: [facility] INVASIVE CV LAB;  Service: Cardiovascular;  Laterality: N/A;  . KNEE SURGERY Right 1966  . LEFT HEART CATH AND CORONARY ANGIOGRAPHY N/A 07/08/2018   Procedure: LEFT HEART CATH AND CORONARY ANGIOGRAPHY;  Surgeon: Iran Ouch, MD;  Location: [facility] INVASIVE CV LAB;  Service: Cardiovascular;  Laterality: N/A;  . REPLACEMENT TOTAL KNEE Left 2005    Current Meds  Medication Sig  . albuterol (PROVENTIL HFA;VENTOLIN HFA) 108 (90 Base) MCG/ACT inhaler Inhale into the lungs every 6 (six) hours as needed for wheezing or shortness of breath.  Marland Kitchen amLODipine (NORVASC) 5 MG tablet Take 1 tablet (5 mg total) by mouth daily.  Marland Kitchen aspirin 81 MG tablet Take 81 mg by mouth daily.  Marland Kitchen doxazosin (CARDURA) 2 MG tablet Take 2 mg by mouth daily.   . Evolocumab (REPATHA SURECLICK) 140 MG/ML SOAJ Inject 1 pen into the skin every 14 (fourteen) days.  . fenofibrate (TRICOR) 145 MG tablet Take 145 mg by mouth daily.   . finasteride (PROSCAR) 5 MG tablet Take 5 mg by mouth daily.   Marland Kitchen ibuprofen (ADVIL,MOTRIN) 800 MG tablet Take 800 mg by mouth daily as needed.  . isosorbide mononitrate (IMDUR) 30 MG 24 hr tablet Take 1 tablet (30 mg total) by mouth daily.  Marland Kitchen losartan (COZAAR) 100 MG tablet Take 100 mg by mouth daily.   Marland Kitchen senna-docusate (SENOKOT S) 8.6-50 MG  tablet Take 1 tablet by mouth 2 (two) times daily as needed for mild constipation.  . tamsulosin (FLOMAX) 0.4 MG CAPS capsule Take 1 capsule by mouth daily.  . ticagrelor (BRILINTA) 90 MG TABS tablet Take 1 tablet (90 mg total) by mouth 2 (two) times daily.    Allergies:   Ampicillin and Capsicum   Social History:  The patient  reports that he quit smoking about 31 years ago. He has never used smokeless tobacco. He reports that he drinks about 6.6 oz of alcohol per week.   Family History:  The patient's family history includes Alzheimer's disease in his mother; CAD in his father; Diabetes in his mother; Kidney cancer in his father; Lung cancer in his father.  ROS:   Review of Systems  Constitutional: Negative for chills, diaphoresis, fever, malaise/fatigue and weight loss.  HENT: Negative for congestion.   Eyes: Negative for discharge and redness.  Respiratory: Negative for cough, hemoptysis, sputum production, shortness of breath and wheezing.   Cardiovascular: Negative for chest pain, palpitations,  orthopnea, claudication, leg swelling and PND.  Gastrointestinal: Negative for abdominal pain, blood in stool, heartburn, melena, nausea and vomiting.  Genitourinary: Negative for hematuria.  Musculoskeletal: Negative for falls and myalgias.  Skin: Negative for rash.  Neurological: Negative for dizziness, tingling, tremors, sensory change, speech change, focal weakness, loss of consciousness and weakness.  Endo/Heme/Allergies: Does not bruise/bleed easily.  Psychiatric/Behavioral: Negative for substance abuse. The patient is not nervous/anxious.   All other systems reviewed and are negative.    PHYSICAL EXAM:  VS:  BP (!) 158/76 (BP Location: Left Arm, Patient Position: Sitting, Cuff Size: Normal)   Pulse 71   Ht 6\' 3"  (1.905 m)   Wt 271 lb (122.9 kg)   BMI 33.87 kg/m  BMI: Body mass index is 33.87 kg/m.  Physical Exam  Constitutional: He is oriented to person, place, and time. He  appears well-developed and well-nourished.  HENT:  Head: Normocephalic and atraumatic.  Eyes: Right eye exhibits no discharge. Left eye exhibits no discharge.  Neck: Normal range of motion. No JVD present.  Cardiovascular: Normal rate, regular rhythm, S1 normal and S2 normal. Exam reveals no distant heart sounds, no friction rub, no midsystolic click and no opening snap.  Murmur heard.  Harsh midsystolic murmur is present with a grade of 3/6 at the upper right sternal border radiating to the neck. Pulses:      Carotid pulses are 2+ on the right side with bruit, and 2+ on the left side with bruit.      Posterior tibial pulses are 2+ on the right side, and 2+ on the left side.  Right radial cardiac cath site is welling healing without any bleeding, bruising, swelling, erythema, warmth, or TTP. Radial pulse 2+.   Pulmonary/Chest: Effort normal and breath sounds normal. No respiratory distress. He has no decreased breath sounds. He has no wheezes. He has no rales. He exhibits no tenderness.  Abdominal: Soft. He exhibits no distension. There is no tenderness.  Musculoskeletal: He exhibits no edema.  Neurological: He is alert and oriented to person, place, and time.  Skin: Skin is warm and dry. No cyanosis. Nails show no clubbing.  Psychiatric: He has a normal mood and affect. His speech is normal and behavior is normal. Judgment and thought content normal.     EKG:  Was ordered and interpreted by me today. Shows NSR, 71 bpm, no acute st/t changes   Recent Labs: 07/05/2018: Magnesium 2.1 07/09/2018: BUN 18; Creatinine, Ser 1.29; Hemoglobin 16.5; Platelets 182; Potassium 3.8; Sodium 140  07/06/2018: Cholesterol 124; HDL 28; LDL Cholesterol 28; Total CHOL/HDL Ratio 4.4; Triglycerides 341; VLDL 68   Estimated Creatinine Clearance: 75.3 mL/min (A) (by C-G formula based on SCr of 1.29 mg/dL (H)).   Wt Readings from Last 3 Encounters:  07/23/18 271 lb (122.9 kg)  07/09/18 273 lb 1.6 oz (123.9 kg)    05/14/18 287 lb 3.2 oz (130.3 kg)     Other studies reviewed: Additional studies/records reviewed today include: summarized above  ASSESSMENT AND PLAN:  1. CAD of the native coronary arteries without angina with recent NSTEMI: No symptoms concerning for recurrence of angina at this time. Continue DAPT with ASA 81 mg daily and Brilinta 90 mg bid without interruption for at least the next 12 months. For any refractory angina would pursue repeat LHC with FFR of the RCA given large vessel size. He will participate with cardiac rehab. Aggressive secondary prevention. No plans for repeat ischemic evaluation at this time. Post-cath instructions.  2. Moderate aortic stenosis: Peak-to-peak gradient noted during LHC as above. Continue to monitor clinically at this time.   3. Syncope: Felt to be in the setting of heat exhaustion/dehydration. No further issues. Less likely ACS or related to his aortic stenosis. If he has any firther episodes, would pursue cardiac monitoring.   4. HTN: Blood pressure is poorly controlled today. Home readings have been in the 130s to 160s systolic. Patient and his wife indicate these readings are "great." Increase amlodipine to 10 mg daily. Consider addition on of ACEi or ARB in follow up. He is intolerant of beta blockers. Continue Cardura.   5. HLD: LDL 28 during recent admission in 06/2018. Has been maintained on Repatha and fenofibrate given statin intolerance. Our office will be contacting pharmaceutical company/pharmacy to see why this is no longer being covered for him.   6. DM2: Followed by PCP. Working on diet and exercise. PCP checking A1c in 09/2018.   7. Bilateral carotid bruit: Schedule carotid artery ultrasound.  Disposition: F/u with Dr. Kirke Corin at previously scheduled appointment on 9/20.   Current medicines are reviewed at length with the patient today.  The patient did not have any concerns regarding medicines.  Signed, Eula Listen, PA-C 07/23/2018 3:34  PM     [facility] [place] [address] [place], [place] [address] [phone]

## 2018-07-23 NOTE — Patient Instructions (Signed)
Medication Instructions:  Your physician has recommended you make the following change in your medication:  1- INCREASE Amlodipine to 10 mg by mouth once a day.   Labwork: NONE  Testing/Procedures: Your physician has requested that you have a carotid duplex. This test is an ultrasound of the carotid arteries in your neck. It looks at blood flow through these arteries that supply the brain with blood. Allow one hour for this exam. There are no restrictions or special instructions.    Follow-Up: Your physician recommends that you schedule a follow-up appointment AS SCHEDULED WITH DR ARIDA.    If you need a refill on your cardiac medications before your next appointment, please call your pharmacy.

## 2018-07-30 ENCOUNTER — Telehealth: Payer: Self-pay | Admitting: *Deleted

## 2018-07-30 NOTE — Telephone Encounter (Signed)
Called [facility] pharmacy to inquire if Prior Auth was needed and of price of medication. Prior auth already complete and medication ready to dispense for $227.59 a month.  Called [employer]. They had no record of patient on file. It is noted in Epic that patient did not qualify for [employer] based on his insurance coverage.   Called patient's wife, ok per DPR. Originally, [employer] said the 1st month would be $331 and then it would be $44.44/month after that. However, after they paid the 1st month, the price went up to $227.59 a month according to [facility] that patient's wife spoke with. Patient cannot afford that price for medication. The [employer] foundation does not have any funds available at this time but patient is on waiting list. She also tried Patient [employer] [phone]. 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 Will provide 2 doses of medication to patient until we can get this worked out.  Medication Samples have been provided to the patient.  Drug name: Repatha       Strength: 140mg /ml        Qty: 2 sureclicks  LOT: [id number]  Exp.Date: 07/2019   Called Repatha Drug Rep. He suggested I make sure the pharmacy has the new NDC number [phone]) and inquire if patient is in the donut hole. He also provided number of a fellow rep to help. I contacted, Darryl, and he suggested I resubmit information to [employer] along with proof of prior authorization letter. Darryl said they were not affiliated with [employer] and still recommended reapplication to [employer].  Called [employer]. They will be faxing me a prior authorization letter over the next 24 hours. I inquired on patient's cost and donut hole. Patient has not reached the donut hole and has around $1300 to pay before that happens. Repatha is a Tier 4 drug for the patient and the patient will have to pay $227.59 a month for the medicine. Once he hits the  donut hole, then he would pay 25% of Repatha.

## 2018-08-07 NOTE — Telephone Encounter (Signed)
Checking with Pam to see if she still has Patient's assistance application to resubmit. If not, we will start a new one.

## 2018-08-08 ENCOUNTER — Ambulatory Visit: Payer: Medicare Other

## 2018-08-08 NOTE — Telephone Encounter (Signed)
Could not locate prior application. Refilled out and had Dr Kirke Corin sign [employer] Patient Assistance application.  Called patient's wife to let her know that we need to resubmit the application with the proof of prior authorization. Patient is coming tomorrow morning for doppler and will pick up the application to take home then. Then complete and bring it back to [place]. She was very Adult nurse.

## 2018-08-09 ENCOUNTER — Ambulatory Visit (INDEPENDENT_AMBULATORY_CARE_PROVIDER_SITE_OTHER): Payer: Medicare Other

## 2018-08-09 DIAGNOSIS — R0989 Other specified symptoms and signs involving the circulatory and respiratory systems: Secondary | ICD-10-CM

## 2018-08-14 NOTE — Telephone Encounter (Signed)
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 We should plan on checking bilateral arm BPs at his next follow up. As far as the thyroid lesions go, I am uncertain if these require further evaluation or not. He can discuss that with his PCP.

## 2018-08-16 ENCOUNTER — Telehealth: Payer: Self-pay | Admitting: Cardiovascular Disease

## 2018-08-16 NOTE — Telephone Encounter (Signed)
Per patient request faxed carotid results to pcp office for review.

## 2018-08-21 ENCOUNTER — Telehealth: Payer: Self-pay | Admitting: Cardiovascular Disease

## 2018-08-21 MED ORDER — AMLODIPINE BESYLATE 5 MG PO TABS: 5.0000 mg | ORAL_TABLET | Freq: Every day | ORAL | 3 refills | Status: DC

## 2018-08-21 NOTE — Telephone Encounter (Signed)
Spoke with patients wife per release form and reviewed recommendations to decrease amlodipine back down to 5 mg once daily. She verbalized understanding and was very appreciative for the call with no further questions.

## 2018-08-21 NOTE — Telephone Encounter (Signed)
Pt c/o BP issue: STAT if pt c/o blurred vision, one-sided weakness or slurred speech  1. What are your last 5 BP readings?  112/68 pulse 57 ~125/70 last night  No other readings currently, patient wife driving   2. Are you having any other symptoms (ex. Dizziness, headache, blurred vision, passed out)? Dizzy, had a moment of blurred vision   3. What is your BP issue? Patient wife calling, states patient had a stent placed recently and had been doing great but today patient has been having symptoms of dizziness and is a little concerned with blood pressure and pulse rate  Please call to discuss

## 2018-08-21 NOTE — Telephone Encounter (Signed)
Amlodipine can lead to some dizziness. Cut back on the dose to 5 mg daily and monitor BP. Keep [place] updated.

## 2018-08-21 NOTE — Telephone Encounter (Signed)
Spoke with patient and his wife and she reports that he called her while she was out and he had a episode of dizziness. She reports his systolic was 112 and heart rate in the 50's during that episode. She states that a recheck was 114/53 and heart rate 51-57. She said that they did see Alycia Rossetti recently and he doubled up on the amlodipine. She wondered if it was from that. Instructed them to continue the medication, monitor his blood pressures closely especially when he is dizzy, and call to let us know the range in a couple of days. Reviewed that he should use caution when changing positions and monitor BP during any of those episodes. She verbalized understanding of our conversation, agreement with plan, and had no further questions at this time. Will route to provider for review as well.

## 2018-08-27 ENCOUNTER — Other Ambulatory Visit: Payer: Self-pay | Admitting: Family Medicine

## 2018-08-27 DIAGNOSIS — E041 Nontoxic single thyroid nodule: Secondary | ICD-10-CM

## 2018-09-05 ENCOUNTER — Encounter
Admission: RE | Admit: 2018-09-05 | Discharge: 2018-09-05 | Disposition: A | Discharge status: Home / Self Care | Payer: Medicare Other | Source: Ambulatory Visit | Attending: Family Medicine | Admitting: Family Medicine

## 2018-09-05 ENCOUNTER — Ambulatory Visit
Admission: RE | Admit: 2018-09-05 | Discharge: 2018-09-05 | Disposition: A | Discharge status: Home / Self Care | Payer: Medicare Other | Source: Ambulatory Visit | Attending: Family Medicine | Admitting: Family Medicine

## 2018-09-05 DIAGNOSIS — E041 Nontoxic single thyroid nodule: Secondary | ICD-10-CM

## 2018-09-05 MED ORDER — SODIUM IODIDE I-123 7.4 MBQ CAPS
592.2000 | ORAL_CAPSULE | Freq: Once | ORAL | Status: AC
  Administered 2018-09-05: 592.2 via ORAL

## 2018-09-06 ENCOUNTER — Encounter
Admission: RE | Admit: 2018-09-06 | Discharge: 2018-09-06 | Disposition: A | Discharge status: Home / Self Care | Payer: Medicare Other | Source: Ambulatory Visit | Attending: Family Medicine | Admitting: Family Medicine

## 2018-09-13 ENCOUNTER — Encounter: Payer: Self-pay | Admitting: Cardiovascular Disease

## 2018-09-13 ENCOUNTER — Ambulatory Visit (INDEPENDENT_AMBULATORY_CARE_PROVIDER_SITE_OTHER): Payer: Medicare Other | Admitting: Cardiovascular Disease

## 2018-09-13 VITALS — BP 110/60 | HR 57 | Ht 75.0 in | Wt 261.8 lb

## 2018-09-13 DIAGNOSIS — I739 Peripheral vascular disease, unspecified: Secondary | ICD-10-CM | POA: Diagnosis not present

## 2018-09-13 DIAGNOSIS — I5022 Chronic systolic (congestive) heart failure: Secondary | ICD-10-CM | POA: Diagnosis not present

## 2018-09-13 DIAGNOSIS — I10 Essential (primary) hypertension: Secondary | ICD-10-CM

## 2018-09-13 DIAGNOSIS — I2510 Atherosclerotic heart disease of native coronary artery without angina pectoris: Secondary | ICD-10-CM

## 2018-09-13 DIAGNOSIS — I350 Nonrheumatic aortic (valve) stenosis: Secondary | ICD-10-CM

## 2018-09-13 DIAGNOSIS — I25118 Atherosclerotic heart disease of native coronary artery with other forms of angina pectoris: Secondary | ICD-10-CM

## 2018-09-13 DIAGNOSIS — E785 Hyperlipidemia, unspecified: Secondary | ICD-10-CM

## 2018-09-13 NOTE — Progress Notes (Signed)
Cardiology Office Note   Date:  09/13/2018   ID:  Kurt Kelley, DOB 12-24-1946, MRN [id number]  PCP:  Jerl Mina, MD  Cardiologist:   Lorine Bears, MD   Chief Complaint  Patient presents with  . other    Follow up s/p cardiac cath & stent placement. Meds reviewed by the pt. verbally. "doing well."       History of Present Illness: Kurt Kelley is a 72 y.o. male who is here today for a follow-up visit regarding coronary artery disease, chronic systolic heart failure and aortic stenosis.  The patient has known history of coronary artery disease with previous stent placement in 2003, chronic systolic heart failure due to mild ischemic cardiomyopathy, aortic stenosis, essential hypertension, hyperlipidemia, sleep apnea and obesity.  He has known history of intolerance to statins due to severe myalgia.  He also reports intolerance to CPAP due to claustrophobia.  He is aware of history of peripheral arterial disease and was seen many years ago by Dr. Orson Slick and was told about moderate 50% disease that did not require revascularization.  Vascular evaluation in May 2019 showed an ABI of 1.05 on the right and 0.97 on the left.  Duplex showed mild atherosclerosis in the right lower extremity.  On the left, there was moderate disease affecting the distal SFA.  The patient was hospitalized in July with non-ST elevation myocardial infarction.  Cardiac catheterization showed mild LAD disease, 95% stenosis in the mid left circumflex, patent RCA stent and 60% distal RCA stenosis.  I performed successful angioplasty and drug-eluting stent placement to the left circumflex.  There was evidence of moderate aortic stenosis with a peak gradient of 15 to 20 mmHg.  EF was 50 to 55%.  The patient did not tolerate beta-blockers.  He was hypertensive and was started on amlodipine.  He has been feeling extremely well since then with no chest pain or shortness of breath he has been walking regularly for  exercise and he improved his diet significantly.  He has been able to lose at least 20 pounds since the event.  He was started on Repatha but unfortunately was not able to continue due to cost.  Past Medical History:  Diagnosis Date  . Aortic stenosis    a. eCHO 04/2018: EF to 50-55%, no RWMA, Gr1DD, mild to moderate aortic stenosis with mild aortic insufficiency, mildly dilated left atrium, RVSF normal, PASP mildly elevated  . Arthritis    lower back, right knee  . CAD S/P percutaneous coronary angioplasty 2003   a. remote PCI in 2003; b. Myoview 10/18 no ischemia, EF 39%  . HLD (hyperlipidemia)    a. intolerant to statins  . Hypertension   . Ischemic cardiomyopathy    MILD -- ECHO 04/2018: a. EF to 50-55%, no RWMA, Gr1DD, mild to moderate aortic stenosis with mild aortic insufficiency, mildly dilated left atrium, RVSF normal, PASP mildly elevated  . PAD (peripheral artery disease) (HCC)   . Sleep apnea    a. intolerant of CPAP    Past Surgical History:  Procedure Laterality Date  . CARDIAC CATHETERIZATION  2003   stent  . CARPAL TUNNEL RELEASE Right 10/24/2017   Procedure: CARPAL TUNNEL RELEASE ENDOSCOPIC;  Surgeon: Christena Flake, MD;  Location: [facility];  Service: Orthopedics;  Laterality: Right;  sleep apnea  . CERVICAL SPINE SURGERY Right 01/23/2017   arthrodesis, discectomy, osteophytectomy, decompression  C3-C4, [facility]  . COLONOSCOPY    . CORONARY STENT INTERVENTION N/A  07/08/2018   Procedure: CORONARY STENT INTERVENTION;  Surgeon: Iran Ouch, MD;  Location: [facility];  Service: Cardiovascular;  Laterality: N/A;  . KNEE SURGERY Right 1966  . LEFT HEART CATH AND CORONARY ANGIOGRAPHY N/A 07/08/2018   Procedure: LEFT HEART CATH AND CORONARY ANGIOGRAPHY;  Surgeon: Iran Ouch, MD;  Location: [facility];  Service: Cardiovascular;  Laterality: N/A;  . REPLACEMENT TOTAL KNEE Left 2005     Current Outpatient Medications  Medication Sig  Dispense Refill  . albuterol (PROVENTIL HFA;VENTOLIN HFA) 108 (90 Base) MCG/ACT inhaler Inhale into the lungs every 6 (six) hours as needed for wheezing or shortness of breath.    Marland Kitchen amLODipine (NORVASC) 5 MG tablet Take 1 tablet (5 mg total) by mouth daily. 180 tablet 3  . aspirin 81 MG tablet Take 81 mg by mouth daily.    Marland Kitchen azelastine (ASTELIN) 0.1 % nasal spray PUT 1 SPRAY IN EACH NOSTRIL EVERY 12 HOURS AS NEEDED FOR ALLERGIES  11  . doxazosin (CARDURA) 2 MG tablet Take 2 mg by mouth daily.     . Evolocumab (REPATHA SURECLICK) 140 MG/ML SOAJ Inject 1 pen into the skin every 14 (fourteen) days. 6 pen 3  . fenofibrate (TRICOR) 145 MG tablet Take 145 mg by mouth daily.     . finasteride (PROSCAR) 5 MG tablet Take 5 mg by mouth daily.     Marland Kitchen ibuprofen (ADVIL,MOTRIN) 800 MG tablet Take 800 mg by mouth daily as needed.    . isosorbide mononitrate (IMDUR) 30 MG 24 hr tablet Take 1 tablet (30 mg total) by mouth daily. 90 tablet 3  . losartan (COZAAR) 100 MG tablet Take 100 mg by mouth daily.     . nitroGLYCERIN (NITROSTAT) 0.4 MG SL tablet Place 1 tablet (0.4 mg total) under the tongue every 5 (five) minutes as needed for chest pain. MAXIMUM OF 3 DOSES. 25 tablet 3  . senna-docusate (SENOKOT S) 8.6-50 MG tablet Take 1 tablet by mouth 2 (two) times daily as needed for mild constipation.    . tamsulosin (FLOMAX) 0.4 MG CAPS capsule Take 1 capsule by mouth daily.  11  . ticagrelor (BRILINTA) 90 MG TABS tablet Take 1 tablet (90 mg total) by mouth 2 (two) times daily. 180 tablet 3   No current facility-administered medications for this visit.     Allergies:   Ampicillin and Capsicum    Social History:  The patient  reports that he quit smoking about 31 years ago. He has never used smokeless tobacco. He reports that he drinks about 11.0 standard drinks of alcohol per week.   Family History:  The patient's family history includes Alzheimer's disease in his mother; CAD in his father; Diabetes in his  mother; Kidney cancer in his father; Lung cancer in his father.    ROS:  Please see the history of present illness.   Otherwise, review of systems are positive for none.   All other systems are reviewed and negative.    PHYSICAL EXAM: VS:  BP 110/60 (BP Location: Left Arm, Patient Position: Sitting, Cuff Size: Normal)   Pulse (!) 57   Ht 6\' 3"  (1.905 m)   Wt 261 lb 12 oz (118.7 kg)   BMI 32.72 kg/m  , BMI Body mass index is 32.72 kg/m. GEN: Well nourished, well developed, in no acute distress  HEENT: normal  Neck: no JVD, carotid bruits, or masses Cardiac: RRR; no rubs, or gallops,no edema .  2 /6 systolic  ejection murmur in the aortic area. Respiratory:  clear to auscultation bilaterally, normal work of breathing GI: soft, nontender, nondistended, + BS MS: no deformity or atrophy  Skin: warm and dry, no rash Neuro:  Strength and sensation are intact Psych: euthymic mood, full affect Vascular: Femoral pulses are normal.  Distal pulses are not palpable.   EKG:  EKG is not ordered today.   Recent Labs: 07/05/2018: Magnesium 2.1 07/09/2018: BUN 18; Creatinine, Ser 1.29; Hemoglobin 16.5; Platelets 182; Potassium 3.8; Sodium 140    Lipid Panel    Component Value Date/Time   CHOL 124 07/06/2018 0530   TRIG 341 (H) 07/06/2018 0530   HDL 28 (L) 07/06/2018 0530   CHOLHDL 4.4 07/06/2018 0530   VLDL 68 (H) 07/06/2018 0530   LDLCALC 28 07/06/2018 0530      Wt Readings from Last 3 Encounters:  09/13/18 261 lb 12 oz (118.7 kg)  07/23/18 271 lb (122.9 kg)  07/09/18 273 lb 1.6 oz (123.9 kg)       PAD Screen 04/09/2018  Previous PAD dx? No  Previous surgical procedure? No  Pain with walking? Yes  Subsides with rest? No  Feet/toe relief with dangling? No  Painful, non-healing ulcers? No  Extremities discolored? Yes      ASSESSMENT AND PLAN:  1.  Coronary artery disease involving native coronary arteries with other forms of angina: He has been doing extremely well since  drug-eluting stent placement to the left circumflex.  He has residual 60% disease in the distal right coronary artery but currently with no anginal symptoms.  Thus, I recommend continuing medical therapy.  He is tolerating dual antiplatelet therapy with no side effects.  2. Peripheral arterial disease: The patient has mildly reduced ABI on the left side with evidence of borderline significant disease affecting the left SFA.   Currently with no claudications.  Continue medical therapy.  3.   moderate aortic stenosis:  Repeat echocardiogram in July 2020.  4.  Chronic systolic heart failure: Currently on losartan.  Most recent EF was 50 to 55%.  He did not tolerate beta-blockers.  5.  Hyperlipidemia: Intolerance to statins.  Unfortunately, he could not afford Repatha.  I suspect his lipid profile is improved with his healthy lifestyle changes but we can consider Zetia in the future.  6.  Essential hypertension: Blood pressure is well controlled on current medications.   Disposition:   FU with me in 6 months  Signed,  Lorine Bears, MD  09/13/2018 10:51 AM    [facility]

## 2018-09-13 NOTE — Patient Instructions (Addendum)
Medication Instructions: Your physician recommends that you continue on your current medications as directed. Please refer to the Current Medication list given to you today.  If you need a refill on your cardiac medications before your next appointment, please call your pharmacy.   Follow-Up: Your physician wants you to follow-up in 6 months with Dr. Kirke Corin. You will receive a reminder letter in the mail two months in advance. If you don't receive a letter, please call our office at [phone] to schedule this follow-up appointment.   Special Instructions:    Thank you for choosing Heartcare at [facility]!

## 2018-09-25 NOTE — Telephone Encounter (Signed)
Wife calling back. They were unable to complete the paperwork and were told he would not qualify. They just saw Dr Kirke Corin on 09/13/18 and he is aware. Patient is getting repeat labs in October at this PCP. She will let us know if his numbers are still up and call us back. She said patient is willing to try Praluent if Dr Kirke Corin agrees at the time the lab work is drawn. No further questions at this time.

## 2018-09-25 NOTE — Telephone Encounter (Signed)
No answer. Left message to call back.   

## 2019-01-20 ENCOUNTER — Telehealth: Payer: Self-pay | Admitting: *Deleted

## 2019-01-20 MED ORDER — NITROGLYCERIN 0.4 MG SL SUBL: 0.4000 mg | SUBLINGUAL_TABLET | SUBLINGUAL | 1 refills | Status: DC | PRN

## 2019-01-20 NOTE — Telephone Encounter (Signed)
Per the patient advice request:  I accidentally washed & dried my nitro glycerin meds & it is now powder. Could I get a new prescription please. [facility], [address], [place]. Thank you!  Prescription has been sent.

## 2019-03-10 NOTE — Progress Notes (Signed)
Cardiology Office Note   Date:  03/11/2019   ID:  Kurt Kelley, DOB 05/20/1946, MRN [id number]  PCP:  Jerl Mina, MD  Cardiologist:  Lorine Bears, MD   Chief Complaint  Patient presents with  . other    6 month f/u. Meds reviewed verbally with pt. Pt. c/o shortness of breath, not ablet to get around like he had been without feeling fatigue.       History of Present Illness: Kurt Kelley is a 73 y.o. male who is here today for a follow-up visit regarding coronary artery disease, chronic systolic heart failure and aortic stenosis.  The patient has known history of coronary artery disease with previous stent placement in 2003, chronic systolic heart failure due to mild ischemic cardiomyopathy, aortic stenosis, essential hypertension, hyperlipidemia, sleep apnea and obesity.  He has known history of intolerance to statins due to severe myalgia.  He also reports intolerance to CPAP due to claustrophobia.  He is aware of history of peripheral arterial disease and was seen many years ago by Dr. Orson Slick and was told about moderate 50% disease that did not require revascularization.  Vascular evaluation in May 2019 showed an ABI of 1.05 on the right and 0.97 on the left.  Duplex showed mild atherosclerosis in the right lower extremity.  On the left, there was moderate disease affecting the distal SFA.  The patient was hospitalized in July 2019 with non-ST elevation myocardial infarction.  Cardiac catheterization showed mild LAD disease, 95% stenosis in the mid left circumflex, patent RCA stent and 60% distal RCA stenosis.  I performed successful angioplasty and drug-eluting stent placement to the left circumflex.  There was evidence of moderate aortic stenosis with a peak gradient of 15 to 20 mmHg.  EF was 50 to 55%.  The patient did not tolerate beta-blockers.  He was started on Repatha but unfortunately was not able to continue due to cost. He improved his lifestyle significantly after  his myocardial infarction and he continues to lose weight.  The patient is doing well today. Accompanied by his wife today. He's having intermittent chest tightness in his left side recently at different times during the day and night. Exertion does not onset the chest tightness. He's been getting fatigued easier. He has been walking less than usual, but he still continues walking daily. Continues to watch his diet and monitors his weight. He continues to eat red meat, but his wife tries to limit the amount. 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 His provider did a fine needle aspiration which was negative. He wants to get a second opinion from another provider. He's been having difficulty swallowing due to the nodules. His Brilinta is now more affordable, but Repatha is still too costly. The lipid panel from October 2019 showed total cholesterol: 121 and LDL: 67. He denies palpitations or any other related symptoms or complaints at this time.    Past Medical History:  Diagnosis Date  . Aortic stenosis    a. eCHO 04/2018: EF to 50-55%, no RWMA, Gr1DD, mild to moderate aortic stenosis with mild aortic insufficiency, mildly dilated left atrium, RVSF normal, PASP mildly elevated  . Arthritis    lower back, right knee  . CAD S/P percutaneous coronary angioplasty 2003   a. remote PCI in 2003; b. Myoview 10/18 no ischemia, EF 39%  . HLD (hyperlipidemia)    a. intolerant to statins  . Hypertension   . Ischemic cardiomyopathy  MILD -- ECHO 04/2018: a. EF to 50-55%, no RWMA, Gr1DD, mild to moderate aortic stenosis with mild aortic insufficiency, mildly dilated left atrium, RVSF normal, PASP mildly elevated  . PAD (peripheral artery disease) (HCC)   . Sleep apnea    a. intolerant of CPAP    Past Surgical History:  Procedure Laterality Date  . CARDIAC CATHETERIZATION  2003   stent  . CARPAL TUNNEL RELEASE Right 10/24/2017   Procedure: CARPAL TUNNEL RELEASE ENDOSCOPIC;   Surgeon: Christena Flake, MD;  Location: [facility];  Service: Orthopedics;  Laterality: Right;  sleep apnea  . CERVICAL SPINE SURGERY Right 01/23/2017   arthrodesis, discectomy, osteophytectomy, decompression  C3-C4, [facility]  . COLONOSCOPY    . CORONARY STENT INTERVENTION N/A 07/08/2018   Procedure: CORONARY STENT INTERVENTION;  Surgeon: Iran Ouch, MD;  Location: [facility];  Service: Cardiovascular;  Laterality: N/A;  . KNEE SURGERY Right 1966  . LEFT HEART CATH AND CORONARY ANGIOGRAPHY N/A 07/08/2018   Procedure: LEFT HEART CATH AND CORONARY ANGIOGRAPHY;  Surgeon: Iran Ouch, MD;  Location: [facility];  Service: Cardiovascular;  Laterality: N/A;  . REPLACEMENT TOTAL KNEE Left 2005     Current Outpatient Medications  Medication Sig Dispense Refill  . albuterol (PROVENTIL HFA;VENTOLIN HFA) 108 (90 Base) MCG/ACT inhaler Inhale into the lungs every 6 (six) hours as needed for wheezing or shortness of breath.    Marland Kitchen amLODipine (NORVASC) 5 MG tablet Take 1 tablet (5 mg total) by mouth daily. 180 tablet 3  . aspirin 81 MG tablet Take 81 mg by mouth daily.    Marland Kitchen azelastine (ASTELIN) 0.1 % nasal spray PUT 1 SPRAY IN EACH NOSTRIL EVERY 12 HOURS AS NEEDED FOR ALLERGIES  11  . BRILINTA 90 MG TABS tablet Take 90 mg by mouth 2 (two) times daily.     . fenofibrate (TRICOR) 145 MG tablet Take 145 mg by mouth daily.     Marland Kitchen ibuprofen (ADVIL,MOTRIN) 800 MG tablet Take 800 mg by mouth daily as needed.    . isosorbide mononitrate (IMDUR) 30 MG 24 hr tablet Take 1 tablet (30 mg total) by mouth daily. 90 tablet 3  . losartan (COZAAR) 100 MG tablet Take 100 mg by mouth daily.     . nitroGLYCERIN (NITROSTAT) 0.4 MG SL tablet Place 1 tablet (0.4 mg total) under the tongue every 5 (five) minutes as needed for chest pain. MAXIMUM OF 3 DOSES. 25 tablet 1  . senna-docusate (SENOKOT S) 8.6-50 MG tablet Take 1 tablet by mouth 2 (two) times daily as needed for mild constipation.    .  tamsulosin (FLOMAX) 0.4 MG CAPS capsule Take 1 capsule by mouth every other day.   11   No current facility-administered medications for this visit.     Allergies:   Ampicillin and Capsicum    Social History:  The patient  reports that he quit smoking about 32 years ago. He has never used smokeless tobacco. He reports current alcohol use of about 11.0 standard drinks of alcohol per week.   Family History:  The patient's family history includes Alzheimer's disease in his mother; CAD in his father; Diabetes in his mother; Kidney cancer in his father; Lung cancer in his father.    ROS:  Please see the history of present illness.   Otherwise, review of systems are positive for none.   All other systems are reviewed and negative.    PHYSICAL EXAM: VS:  BP 140/70 (BP Location:  Left Arm, Patient Position: Sitting, Cuff Size: Normal)   Pulse 66   Ht 6\' 2"  (1.88 m)   Wt 252 lb 8 oz (114.5 kg)   BMI 32.42 kg/m  , BMI Body mass index is 32.42 kg/m. GEN: Well nourished, well developed, in no acute distress  HEENT: normal  Neck: no JVD, carotid bruits, or masses Cardiac: RRR; no rubs, or gallops,no edema . 2 /6 systolic ejection murmur in the aortic area. Respiratory:  clear to auscultation bilaterally, normal work of breathing GI: soft, nontender, nondistended, + BS MS: no deformity or atrophy  Skin: warm and dry, no rash Neuro:  Strength and sensation are intact Psych: euthymic mood, full affect    EKG:  EKG is ordered today. The EKG today demonstrates normal sinus rhythm with no significant ST or T wave changes.  Recent Labs: 07/05/2018: Magnesium 2.1 07/09/2018: BUN 18; Creatinine, Ser 1.29; Hemoglobin 16.5; Platelets 182; Potassium 3.8; Sodium 140    Lipid Panel    Component Value Date/Time   CHOL 124 07/06/2018 0530   TRIG 341 (H) 07/06/2018 0530   HDL 28 (L) 07/06/2018 0530   CHOLHDL 4.4 07/06/2018 0530   VLDL 68 (H) 07/06/2018 0530   LDLCALC 28 07/06/2018 0530       Wt Readings from Last 3 Encounters:  03/11/19 252 lb 8 oz (114.5 kg)  09/13/18 261 lb 12 oz (118.7 kg)  07/23/18 271 lb (122.9 kg)       PAD Screen 04/09/2018  Previous PAD dx? No  Previous surgical procedure? No  Pain with walking? Yes  Subsides with rest? No  Feet/toe relief with dangling? No  Painful, non-healing ulcers? No  Extremities discolored? Yes      ASSESSMENT AND PLAN:  1.  Coronary artery disease involving native coronary arteries with other forms of angina: He has been doing extremely well since drug-eluting stent placement to the left circumflex.  He has residual 60% disease . Recent atypical chest pain.  I recommend monitoring this for now. Continue dual antiplatelet therapy at least until July 2020.  2. Peripheral arterial disease: The patient has mildly reduced ABI on the left side with evidence of borderline significant disease affecting the left SFA.   Currently with no claudications.  Continue medical therapy.  3.   moderate aortic stenosis:  Repeat echocardiogram in 3 months from now.  4.  Chronic systolic heart failure: Currently on losartan.  Most recent EF was 50 to 55%.  He did not tolerate beta-blockers.  5.  Hyperlipidemia: Intolerance to statins.  Unfortunately, he could not afford Repatha.  His lipid profile improved significantly with healthy lifestyle changes.  This can be repeated.  If LDL is above 70, I recommend adding Zetia.  6.  Essential hypertension: Blood pressure is well controlled on current medications.   Disposition:   FU with me in 6 months  I, Jesus Reyes am acting as a Neurosurgeon for Lorine Bears, M.D.  I have reviewed the above documentation for accuracy and completeness, and I agree with the above.   Signed, Lorine Bears, MD 03/11/19 Kurt Kelley, Kurt Kelley

## 2019-03-11 ENCOUNTER — Ambulatory Visit (INDEPENDENT_AMBULATORY_CARE_PROVIDER_SITE_OTHER): Payer: PPO | Admitting: Cardiovascular Disease

## 2019-03-11 ENCOUNTER — Other Ambulatory Visit: Payer: Self-pay

## 2019-03-11 ENCOUNTER — Encounter: Payer: Self-pay | Admitting: Cardiovascular Disease

## 2019-03-11 VITALS — BP 140/70 | HR 66 | Ht 74.0 in | Wt 252.5 lb

## 2019-03-11 DIAGNOSIS — I739 Peripheral vascular disease, unspecified: Secondary | ICD-10-CM

## 2019-03-11 DIAGNOSIS — I350 Nonrheumatic aortic (valve) stenosis: Secondary | ICD-10-CM

## 2019-03-11 DIAGNOSIS — E785 Hyperlipidemia, unspecified: Secondary | ICD-10-CM

## 2019-03-11 DIAGNOSIS — I25118 Atherosclerotic heart disease of native coronary artery with other forms of angina pectoris: Secondary | ICD-10-CM | POA: Diagnosis not present

## 2019-03-11 DIAGNOSIS — I10 Essential (primary) hypertension: Secondary | ICD-10-CM | POA: Diagnosis not present

## 2019-03-11 DIAGNOSIS — I5022 Chronic systolic (congestive) heart failure: Secondary | ICD-10-CM

## 2019-03-11 NOTE — Patient Instructions (Signed)
Medication Instructions:  No changes If you need a refill on your cardiac medications before your next appointment, please call your pharmacy.   Lab work: None ordered   Testing/Procedures: Your physician has requested that you have an echocardiogram in 3 months. Echocardiography is a painless test that uses sound waves to create images of your heart. It provides your doctor with information about the size and shape of your heart and how well your heart's chambers and valves are working. You may receive an ultrasound enhancing agent through an IV if needed to better visualize your heart during the echo.This procedure takes approximately one hour. There are no restrictions for this procedure. This will take place at the [facility] clinic.    Follow-Up: At [facility], you and your health needs are our priority.  As part of our continuing mission to provide you with exceptional heart care, we have created designated Provider Care Teams.  These Care Teams include your primary Cardiologist (physician) and Advanced Practice Providers (APPs -  Physician Assistants and Nurse Practitioners) who all work together to provide you with the care you need, when you need it. You will need a follow up appointment in 6 months.  Please call our office 2 months in advance to schedule this appointment.  You may see Lorine Bears, MD or one of the following Advanced Practice Providers on your designated Care Team:   Nicolasa Ducking, NP Eula Listen, PA-C . Marisue Ivan, PA-C

## 2019-03-18 ENCOUNTER — Telehealth: Payer: Self-pay | Admitting: Cardiovascular Disease

## 2019-03-18 NOTE — Telephone Encounter (Signed)
refernce # [id number] Please contact PA for Repatha

## 2019-03-19 NOTE — Telephone Encounter (Signed)
Spoke with patients wife. (okay Per DPR)  She verified that patient was not currently taking Repatha.  They went with a new insurance company and just wanted to know a price for Repatha.  She stated that the next thing she knows is they was sending [place] paperwork. She stated they DID NOT want the paperwork filled out at this time.  Will place paper PA in filing cabinet under "M"

## 2019-05-28 DIAGNOSIS — E119 Type 2 diabetes mellitus without complications: Secondary | ICD-10-CM | POA: Diagnosis not present

## 2019-05-28 DIAGNOSIS — Z125 Encounter for screening for malignant neoplasm of prostate: Secondary | ICD-10-CM | POA: Diagnosis not present

## 2019-05-28 DIAGNOSIS — I10 Essential (primary) hypertension: Secondary | ICD-10-CM | POA: Diagnosis not present

## 2019-05-28 DIAGNOSIS — E785 Hyperlipidemia, unspecified: Secondary | ICD-10-CM | POA: Diagnosis not present

## 2019-05-29 DIAGNOSIS — I10 Essential (primary) hypertension: Secondary | ICD-10-CM | POA: Diagnosis not present

## 2019-05-29 DIAGNOSIS — E785 Hyperlipidemia, unspecified: Secondary | ICD-10-CM | POA: Diagnosis not present

## 2019-06-05 ENCOUNTER — Other Ambulatory Visit: Payer: Self-pay

## 2019-06-05 NOTE — Telephone Encounter (Signed)
*  STAT* If patient is at the pharmacy, call can be transferred to refill team.   1. Which medications need to be refilled? (please list name of each medication and dose if known)  T[place]or  2. Which pharmacy/location (including street and city if local pharmacy) is medication to be sent to? [facility]   3. Do they need a 30 day or 90 day supply? [place]

## 2019-06-05 NOTE — Telephone Encounter (Signed)
Please advise if ok to refill Fenofibrate 145 mg 1 tablet qd.

## 2019-06-06 MED ORDER — FENOFIBRATE 145 MG PO TABS: 145.0000 mg | ORAL_TABLET | Freq: Every day | ORAL | 2 refills | Status: DC

## 2019-06-06 NOTE — Telephone Encounter (Signed)
Rx(s) sent to pharmacy electronically.  

## 2019-06-11 ENCOUNTER — Other Ambulatory Visit: Payer: Self-pay

## 2019-06-11 ENCOUNTER — Ambulatory Visit (INDEPENDENT_AMBULATORY_CARE_PROVIDER_SITE_OTHER): Payer: PPO

## 2019-06-11 DIAGNOSIS — I350 Nonrheumatic aortic (valve) stenosis: Secondary | ICD-10-CM | POA: Diagnosis not present

## 2019-06-18 ENCOUNTER — Other Ambulatory Visit: Payer: Self-pay

## 2019-06-18 DIAGNOSIS — I350 Nonrheumatic aortic (valve) stenosis: Secondary | ICD-10-CM

## 2019-06-30 DIAGNOSIS — M47812 Spondylosis without myelopathy or radiculopathy, cervical region: Secondary | ICD-10-CM | POA: Diagnosis not present

## 2019-06-30 DIAGNOSIS — Z981 Arthrodesis status: Secondary | ICD-10-CM | POA: Diagnosis not present

## 2019-06-30 DIAGNOSIS — M4322 Fusion of spine, cervical region: Secondary | ICD-10-CM | POA: Diagnosis not present

## 2019-07-10 ENCOUNTER — Other Ambulatory Visit: Payer: Self-pay | Admitting: Physician Assistant

## 2019-07-10 NOTE — Telephone Encounter (Signed)
Please advise if ok to refill Historical provider. 

## 2019-07-10 NOTE — Telephone Encounter (Signed)
Decrease Brilinta to 60 mg twice daily.

## 2019-07-10 NOTE — Telephone Encounter (Signed)
Patient seen 03/11/19 by Dr. Fletcher Anon- per MD note: 1.  Coronary artery disease involving native coronary arteries with other forms of angina: He has been doing extremely well since drug-eluting stent placement to the left circumflex.  He has residual 60% disease . Recent atypical chest pain.  I recommend monitoring this for now. Continue dual antiplatelet therapy at least until July 2020.  To Dr. Fletcher Anon to advise if the patient will remain on Brilinta 90 mg BID, or will dose change at this time or stop.

## 2019-07-11 NOTE — Telephone Encounter (Signed)
Attempted to call the patient regarding his change in dosage on his Brilinta. No answer- I left a message to please call back.

## 2019-07-14 NOTE — Telephone Encounter (Signed)
Patient returning call.

## 2019-07-15 NOTE — Telephone Encounter (Signed)
Spoke with the pt wife. Adv her of Dr.Arida's response. Brilinta is to be continued. Brilinta to be reduced to 60mg  bid. Rx sent to the pt pharmacy. Pt wife verbalized understanding.

## 2019-07-28 DIAGNOSIS — M47812 Spondylosis without myelopathy or radiculopathy, cervical region: Secondary | ICD-10-CM | POA: Diagnosis not present

## 2019-07-28 DIAGNOSIS — M5030 Other cervical disc degeneration, unspecified cervical region: Secondary | ICD-10-CM | POA: Diagnosis not present

## 2019-08-25 DIAGNOSIS — M47812 Spondylosis without myelopathy or radiculopathy, cervical region: Secondary | ICD-10-CM | POA: Diagnosis not present

## 2019-08-25 DIAGNOSIS — M5030 Other cervical disc degeneration, unspecified cervical region: Secondary | ICD-10-CM | POA: Diagnosis not present

## 2019-09-03 ENCOUNTER — Other Ambulatory Visit: Payer: Self-pay | Admitting: Physician Assistant

## 2019-09-16 ENCOUNTER — Telehealth: Payer: Self-pay

## 2019-09-16 NOTE — Telephone Encounter (Signed)
Contacted the patient after receiving his 09/13/19 mychart message.  "I am having a new problem with my heart fluttering. Is there any way I can see Dr Fletcher Anon as soon as possible to discuss this issue. If I have to, I can go to the [place] office. Thk you"  Spoke with the patient. Patient sts that he has been having intermittent palpitations since March 2020. They are sometimes associated with chest pressure. They are short in duration. Patient denies sob, dizziness, lightheadiness, pre-syncope or syncope.  He is scheduled to see Dr. Fletcher Anon on 10/14/19. He was contacted by scheduling in [place] and offered a sooner appt with an APP. Patient declined. He would like to be seen by Dr. Fletcher Anon only. Patient sts that he will just keep his 10/20 appt. Adv the patient to contact the office sooner if symptoms worsen.  Patient verbalized understanding and voiced appreciation for the call.

## 2019-09-25 ENCOUNTER — Telehealth: Payer: Self-pay | Admitting: Cardiovascular Disease

## 2019-09-25 DIAGNOSIS — I2510 Atherosclerotic heart disease of native coronary artery without angina pectoris: Secondary | ICD-10-CM | POA: Diagnosis not present

## 2019-09-25 DIAGNOSIS — I252 Old myocardial infarction: Secondary | ICD-10-CM | POA: Diagnosis not present

## 2019-09-25 DIAGNOSIS — N433 Hydrocele, unspecified: Secondary | ICD-10-CM | POA: Diagnosis not present

## 2019-09-25 DIAGNOSIS — Z7902 Long term (current) use of antithrombotics/antiplatelets: Secondary | ICD-10-CM | POA: Diagnosis not present

## 2019-09-25 DIAGNOSIS — E785 Hyperlipidemia, unspecified: Secondary | ICD-10-CM | POA: Diagnosis not present

## 2019-09-25 DIAGNOSIS — Z79899 Other long term (current) drug therapy: Secondary | ICD-10-CM | POA: Diagnosis not present

## 2019-09-25 DIAGNOSIS — N5089 Other specified disorders of the male genital organs: Secondary | ICD-10-CM | POA: Diagnosis not present

## 2019-09-25 DIAGNOSIS — R319 Hematuria, unspecified: Secondary | ICD-10-CM | POA: Diagnosis not present

## 2019-09-25 DIAGNOSIS — I10 Essential (primary) hypertension: Secondary | ICD-10-CM | POA: Diagnosis not present

## 2019-09-25 DIAGNOSIS — N400 Enlarged prostate without lower urinary tract symptoms: Secondary | ICD-10-CM | POA: Diagnosis not present

## 2019-09-25 NOTE — Telephone Encounter (Signed)
Patient spouse calling  Pt c/o medication issue:  1. Name of Medication: brilinta 60 MG 1 tablet 2 times daily and aspirin 81 MG daily   2. How are you currently taking this medication (dosage and times per day)?    3. Are you having a reaction (difficulty breathing--STAT)? Blood in urine   4. What is your medication issue? Patient spouse calling, states that patient has been experiencing blood in urine.  Another provider would like to hold aspirin and then cut Brilinta in half but patient would like Dr Tyrell Antonio advise.  Please call spouse to discuss at [phone].

## 2019-09-26 NOTE — Telephone Encounter (Signed)
We can stop Brilinta now given that it has been more than 1 year since stent placement.  However, he has to go see his primary care physician to make sure he does not require urology referral.

## 2019-09-30 NOTE — Telephone Encounter (Signed)
Call to patient to discuss POC from Dr. Fletcher Anon.   Pt seems hesitant to stop Brilinta all together saying, "ive been doing good on it". Wife in the background speaking about hesitancy of stopping and possibly taking 1 tablet once per day.   I told them I cannot advise on taking it differently than advised by Dr. Fletcher Anon.   Pt has f/u in 3 weeks with Dr. Fletcher Anon. He agrees to stop it at this time but would like to discuss it further with Dr. Fletcher Anon at appt.   Pt denies any further hematuria. Pt had consulted PCP and seeing a urologist.   Advised pt to call for any further questions or concerns.

## 2019-10-08 ENCOUNTER — Other Ambulatory Visit: Payer: Self-pay | Admitting: Cardiovascular Disease

## 2019-10-14 ENCOUNTER — Ambulatory Visit (INDEPENDENT_AMBULATORY_CARE_PROVIDER_SITE_OTHER): Payer: PPO | Admitting: Cardiovascular Disease

## 2019-10-14 ENCOUNTER — Other Ambulatory Visit: Payer: Self-pay

## 2019-10-14 ENCOUNTER — Encounter: Payer: Self-pay | Admitting: Cardiovascular Disease

## 2019-10-14 VITALS — BP 160/90 | HR 66 | Temp 97.2°F | Ht 74.0 in | Wt 261.5 lb

## 2019-10-14 DIAGNOSIS — Z9861 Coronary angioplasty status: Secondary | ICD-10-CM | POA: Diagnosis not present

## 2019-10-14 DIAGNOSIS — I2510 Atherosclerotic heart disease of native coronary artery without angina pectoris: Secondary | ICD-10-CM

## 2019-10-14 DIAGNOSIS — I359 Nonrheumatic aortic valve disorder, unspecified: Secondary | ICD-10-CM | POA: Diagnosis not present

## 2019-10-14 DIAGNOSIS — I10 Essential (primary) hypertension: Secondary | ICD-10-CM

## 2019-10-14 DIAGNOSIS — R072 Precordial pain: Secondary | ICD-10-CM | POA: Diagnosis not present

## 2019-10-14 DIAGNOSIS — E785 Hyperlipidemia, unspecified: Secondary | ICD-10-CM

## 2019-10-14 MED ORDER — EZETIMIBE 10 MG PO TABS: 10.0000 mg | ORAL_TABLET | Freq: Every day | ORAL | 3 refills | Status: DC

## 2019-10-14 NOTE — Patient Instructions (Signed)
Medication Instructions:  Your physician has recommended you make the following change in your medication:   START Zetia 10mg  daily. An Rx has been sent to your pharmacy.  *If you need a refill on your cardiac medications before your next appointment, please call your pharmacy*  Lab Work: Your physician recommends that you return for a FASTING lipid profile and hepatic panel: in 3 months   If you have labs (blood work) drawn today and your tests are completely normal, you will receive your results only by: Marland Kitchen MyChart Message (if you have MyChart) OR . A paper copy in the mail If you have any lab test that is abnormal or we need to change your treatment, we will call you to review the results.  Testing/Procedures: Your physician has requested that you have a lexiscan myoview. For further information please visit [email or web address]. Please follow instruction sheet, as given.    Follow-Up: At [facility], you and your health needs are our priority.  As part of our continuing mission to provide you with exceptional heart care, we have created designated Provider Care Teams.  These Care Teams include your primary Cardiologist (physician) and Advanced Practice Providers (APPs -  Physician Assistants and Nurse Practitioners) who all work together to provide you with the care you need, when you need it.  Your next appointment:   3 months  The format for your next appointment:   In Person  Provider:    You may see Kathlyn Sacramento, MD or one of the following Advanced Practice Providers on your designated Care Team:    Sherrod Hodgkins, NP  Christell Faith, PA-C  Marrianne Mood, PA-C   Other Instructions [place]  Your caregiver has ordered a Stress Test with nuclear imaging. The purpose of this test is to evaluate the blood supply to your heart muscle. This procedure is referred to as a "Non-Invasive Stress Test." This is because other than having an IV started in your vein,  nothing is inserted or "invades" your body. Cardiac stress tests are done to find areas of poor blood flow to the heart by determining the extent of coronary artery disease (CAD). Some patients exercise on a treadmill, which naturally increases the blood flow to your heart, while others who are  unable to walk on a treadmill due to physical limitations have a pharmacologic/chemical stress agent called Lexiscan . This medicine will mimic walking on a treadmill by temporarily increasing your coronary blood flow.   Please note: these test may take anywhere between 2-4 hours to complete  PLEASE REPORT TO [place] AT THE FIRST DESK WILL DIRECT YOU WHERE TO GO  Date of Procedure:_____________________________________  Arrival Time for Procedure:______________________________  Instructions regarding medication:   OK to take your morning medications with a sip of water  PLEASE NOTIFY THE OFFICE AT LEAST 24 HOURS IN ADVANCE IF YOU ARE UNABLE TO [place].  [phone] AND  PLEASE NOTIFY NUCLEAR MEDICINE AT [facility] AT LEAST 24 HOURS IN ADVANCE IF YOU ARE UNABLE TO KEEP YOUR APPOINTMENT. [phone]  How to prepare for your Myoview test:  1. Do not eat or drink after midnight 2. No caffeine for 24 hours prior to test 3. No smoking 24 hours prior to test. 4. Your medication may be taken with water.  If your doctor stopped a medication because of this test, do not take that medication. 5. Ladies, please do not wear dresses.  Skirts or pants are appropriate. Please wear  a short sleeve shirt. 6. No perfume, cologne or lotion. 7. Wear comfortable walking shoes. No heels!

## 2019-10-14 NOTE — Progress Notes (Signed)
Cardiology Office Note   Date:  10/14/2019   ID:  [name], DOB Sep 08, 1946, MRN [id number]  PCP:  Kurt Mina, MD  Cardiologist:  Kurt Bears, MD   Chief Complaint  Patient presents with  . office visit    6 mo F/U; Patient reports low energy and chest discomfort      History of Present Illness: Kurt Kelley is a 73 y.o. male who is here today for a follow-up visit regarding coronary artery disease, chronic systolic heart failure and aortic stenosis.  The patient has known history of coronary artery disease with previous stent placement in 2003, chronic systolic heart failure due to mild ischemic cardiomyopathy, aortic stenosis, essential hypertension, hyperlipidemia, sleep apnea and obesity.  He has known history of intolerance to statins due to severe myalgia.  He also reports intolerance to CPAP due to claustrophobia.  He is aware of history of peripheral arterial disease and was seen many years ago by Dr. Orson Kelley and was told about moderate 50% disease that did not require revascularization. Vascular evaluation in May 2019 showed an ABI of 1.05 on the right and 0.97 on the left.  Duplex showed mild atherosclerosis in the right lower extremity.  On the left, there was moderate disease affecting the distal SFA. The patient was hospitalized in July 2019 with non-ST elevation myocardial infarction.  Cardiac catheterization showed mild LAD disease, 95% stenosis in the mid left circumflex, patent RCA stent and 60% distal RCA stenosis.  I performed successful angioplasty and drug-eluting stent placement to the left circumflex.  There was evidence of moderate aortic stenosis with a peak gradient of 15 to 20 mmHg.  EF was 50 to 55%.  The patient did not tolerate beta-blockers.  He was started on Repatha but unfortunately was not able to continue due to cost.  He had significant gross hematuria recently and went to Kurt Kelley.  He had CT scans and other work-up and was told if  no significant abnormalities but the need to be fully evaluated by a urologist.  He is scheduled to see Dr. Lonna Kelley next month.  Since that time, we discontinued Brilinta and kept him on aspirin and he has not had recurrent bleeding.  He does complain of extreme fatigue and loss of energy.  In addition, he has occasional substernal chest tightness.  He does report known history of sleep apnea but he is intolerant to CPAP due to claustrophobia.    Past Medical History:  Diagnosis Date  . Aortic stenosis    a. eCHO 04/2018: EF to 50-55%, no RWMA, Gr1DD, mild to moderate aortic stenosis with mild aortic insufficiency, mildly dilated left atrium, RVSF normal, PASP mildly elevated  . Arthritis    lower back, right knee  . CAD S/P percutaneous coronary angioplasty 2003   a. remote PCI in 2003; b. Myoview 10/18 no ischemia, EF 39%  . HLD (hyperlipidemia)    a. intolerant to statins  . Hypertension   . Ischemic cardiomyopathy    MILD -- ECHO 04/2018: a. EF to 50-55%, no RWMA, Gr1DD, mild to moderate aortic stenosis with mild aortic insufficiency, mildly dilated left atrium, RVSF normal, PASP mildly elevated  . PAD (peripheral artery disease) (HCC)   . Sleep apnea    a. intolerant of CPAP    Past Surgical History:  Procedure Laterality Date  . CARDIAC CATHETERIZATION  2003   stent  . CARPAL TUNNEL RELEASE Right 10/24/2017   Procedure: CARPAL TUNNEL RELEASE ENDOSCOPIC;  Surgeon:  Poggi, Marshall Cork, MD;  Location: [place];  Service: Orthopedics;  Laterality: Right;  sleep apnea  . CERVICAL SPINE SURGERY Right 01/23/2017   arthrodesis, discectomy, osteophytectomy, decompression  C3-C4, [facility]  . COLONOSCOPY    . CORONARY STENT INTERVENTION N/A 07/08/2018   Procedure: CORONARY STENT INTERVENTION;  Surgeon: [name], MD;  Location: [place] [facility];  Service: Cardiovascular;  Laterality: N/A;  . KNEE SURGERY Right 1966  . LEFT HEART CATH AND CORONARY ANGIOGRAPHY N/A 07/08/2018    Procedure: LEFT HEART CATH AND CORONARY ANGIOGRAPHY;  Surgeon: [name], MD;  Location: [place] CV [facility];  Service: Cardiovascular;  Laterality: N/A;  . REPLACEMENT TOTAL KNEE Left 2005     Current Outpatient Medications  Medication Sig Dispense Refill  . albuterol (PROVENTIL HFA;VENTOLIN HFA) 108 (90 Base) MCG/ACT inhaler Inhale into the lungs every 6 (six) hours as needed for wheezing or shortness of breath.    Marland Kitchen amLODipine (NORVASC) 5 MG tablet Take 1 tablet (5 mg total) by mouth daily. 90 tablet 0  . aspirin 81 MG tablet Take 81 mg by mouth daily.    Marland Kitchen azelastine (ASTELIN) 0.1 % nasal spray PUT 1 SPRAY IN EACH NOSTRIL EVERY 12 HOURS AS NEEDED FOR ALLERGIES  11  . fenofibrate (TRICOR) 145 MG tablet Take 1 tablet (145 mg total) by mouth daily. 90 tablet 2  . isosorbide mononitrate (IMDUR) 30 MG 24 hr tablet TAKE 1 TABLET BY MOUTH EVERY DAY 90 tablet 0  . losartan (COZAAR) 100 MG tablet Take 100 mg by mouth daily.     . nitroGLYCERIN (NITROSTAT) 0.4 MG SL tablet Place 1 tablet (0.4 mg total) under the tongue every 5 (five) minutes as needed for chest pain. MAXIMUM OF 3 DOSES. 25 tablet 1  . senna-docusate (SENOKOT S) 8.6-50 MG tablet Take 1 tablet by mouth 2 (two) times daily as needed for mild constipation.    . tamsulosin (FLOMAX) 0.4 MG CAPS capsule Take 1 capsule by mouth every other day.   11  . amLODipine (NORVASC) 5 MG tablet Take 1 tablet (5 mg total) by mouth daily. 180 tablet 3  . ibuprofen (ADVIL,MOTRIN) 800 MG tablet Take 800 mg by mouth daily as needed.     No current facility-administered medications for this visit.     Allergies:   Ampicillin and Capsicum    Social History:  The patient  reports that he quit smoking about 32 years ago. He has never used smokeless tobacco. He reports current alcohol use of about 11.0 standard drinks of alcohol per week. He reports previous drug use.   Family History:  The patient's family history includes Alzheimer's disease in  his mother; CAD in his father; Diabetes in his mother; Kidney cancer in his father; Lung cancer in his father.    ROS:  Please see the history of present illness.   Otherwise, review of systems are positive for none.   All other systems are reviewed and negative.    PHYSICAL EXAM: VS:  BP (!) 160/90 (BP Location: Left Arm, Patient Position: Sitting, Cuff Size: Normal)   Pulse 66   Temp (!) 97.2 F (36.2 C)   Ht 6\' 2"  (1.88 m)   Wt 261 lb 8 oz (118.6 kg)   SpO2 98%   BMI 33.57 kg/m  , BMI Body mass index is 33.57 kg/m. GEN: Well nourished, well developed, in no acute distress  HEENT: normal  Neck: no JVD, carotid bruits, or masses Cardiac: RRR;  no rubs, or gallops,no edema . 2 /6 systolic ejection murmur in the aortic area. Respiratory:  clear to auscultation bilaterally, normal work of breathing GI: soft, nontender, nondistended, + BS MS: no deformity or atrophy  Skin: warm and dry, no rash Neuro:  Strength and sensation are intact Psych: euthymic mood, full affect    EKG:  EKG is ordered today. The EKG today demonstrates normal sinus rhythm with nonspecific ST changes.  Recent Labs: No results found for requested labs within last 8760 hours.    Lipid Panel    Component Value Date/Time   CHOL 124 07/06/2018 0530   TRIG 341 (H) 07/06/2018 0530   HDL 28 (L) 07/06/2018 0530   CHOLHDL 4.4 07/06/2018 0530   VLDL 68 (H) 07/06/2018 0530   LDLCALC 28 07/06/2018 0530      Wt Readings from Last 3 Encounters:  10/14/19 261 lb 8 oz (118.6 kg)  03/11/19 252 lb 8 oz (114.5 kg)  09/13/18 261 lb 12 oz (118.7 kg)       PAD Screen 04/09/2018  Previous PAD dx? No  Previous surgical procedure? No  Pain with walking? Yes  Subsides with rest? No  Feet/toe relief with dangling? No  Painful, non-healing ulcers? No  Extremities discolored? Yes      ASSESSMENT AND PLAN:  1.  Coronary artery disease involving native coronary arteries with other forms of angina:  He has  residual 60% disease .  He complains of increased fatigue and also increased episodes of substernal chest pain.  Due to that, I recommend evaluation with a Lexiscan Myoview.  2. Peripheral arterial disease: The patient has mildly reduced ABI on the left side with evidence of borderline significant disease affecting the left SFA.   Currently with no claudications.  Continue medical therapy.  3.   moderate aortic stenosis: Echocardiogram was done in June of this year which showed an EF of 60 to 65%.  Aortic stenosis was moderate with a mean gradient of 20 mmHg and valve area of 1.28.  4.  Chronic systolic heart failure: Currently on losartan.  Most recent EF was normal.    He did not tolerate beta-blockers.  5.  Hyperlipidemia: Intolerance to statins.  Unfortunately, he could not afford Repatha.  Recent lipid profile showed an LDL of 110.  I elected to add Zetia 10 mg daily.  6.  Essential hypertension: Blood pressure is mildly elevated today but he seems to be stressed.  Continue to monitor for now.   Disposition:   FU with me in 3 months     Signed, Kurt Bears, MD 10/14/19 [facility] [place], [place] [phone]

## 2019-10-22 DIAGNOSIS — E042 Nontoxic multinodular goiter: Secondary | ICD-10-CM | POA: Diagnosis not present

## 2019-10-30 ENCOUNTER — Other Ambulatory Visit: Payer: Self-pay

## 2019-10-30 ENCOUNTER — Ambulatory Visit
Admission: RE | Admit: 2019-10-30 | Discharge: 2019-10-30 | Disposition: A | Discharge status: Home / Self Care | Payer: PPO | Source: Ambulatory Visit | Attending: Cardiovascular Disease | Admitting: Cardiovascular Disease

## 2019-10-30 DIAGNOSIS — I2510 Atherosclerotic heart disease of native coronary artery without angina pectoris: Secondary | ICD-10-CM | POA: Diagnosis not present

## 2019-10-30 DIAGNOSIS — Z9861 Coronary angioplasty status: Secondary | ICD-10-CM | POA: Diagnosis not present

## 2019-10-30 DIAGNOSIS — R072 Precordial pain: Secondary | ICD-10-CM | POA: Insufficient documentation

## 2019-10-30 MED ORDER — REGADENOSON 0.4 MG/5ML IV SOLN
0.4000 mg | Freq: Once | INTRAVENOUS | Status: AC
  Administered 2019-10-30: 0.4 mg via INTRAVENOUS

## 2019-10-30 MED ORDER — TECHNETIUM TC 99M TETROFOSMIN IV KIT
10.0000 | PACK | Freq: Once | INTRAVENOUS | Status: AC | PRN
  Administered 2019-10-30: 10.134 via INTRAVENOUS

## 2019-10-30 MED ORDER — TECHNETIUM TC 99M TETROFOSMIN IV KIT
30.0000 | PACK | Freq: Once | INTRAVENOUS | Status: AC | PRN
  Administered 2019-10-30: 31.93 via INTRAVENOUS

## 2019-11-01 LAB — NM MYOCAR MULTI W/SPECT W/WALL MOTION / EF
Estimated workload: 1 METS
Exercise duration (min): 0 min
Exercise duration (sec): 0 s
LV dias vol: 117 mL (ref 62–150)
LV sys vol: 51 mL
MPHR: 148 {beats}/min
Peak HR: 84 {beats}/min
Percent HR: 56 %
Rest HR: 66 {beats}/min
SDS: 0
SRS: 2
SSS: 0
TID: 1

## 2019-11-05 ENCOUNTER — Encounter

## 2019-11-05 ENCOUNTER — Encounter: Payer: Self-pay | Admitting: Urology

## 2019-11-05 ENCOUNTER — Ambulatory Visit (INDEPENDENT_AMBULATORY_CARE_PROVIDER_SITE_OTHER): Payer: PPO | Admitting: Urology

## 2019-11-05 ENCOUNTER — Other Ambulatory Visit: Payer: Self-pay

## 2019-11-05 VITALS — BP 192/85 | HR 75 | Ht 74.0 in | Wt 264.2 lb

## 2019-11-05 DIAGNOSIS — N401 Enlarged prostate with lower urinary tract symptoms: Secondary | ICD-10-CM

## 2019-11-05 DIAGNOSIS — R310 Gross hematuria: Secondary | ICD-10-CM | POA: Diagnosis not present

## 2019-11-05 DIAGNOSIS — E042 Nontoxic multinodular goiter: Secondary | ICD-10-CM | POA: Diagnosis not present

## 2019-11-05 LAB — MICROSCOPIC EXAMINATION
Bacteria, UA: NONE SEEN
Epithelial Cells (non renal): NONE SEEN /hpf (ref 0–10)
RBC, Urine: NONE SEEN /hpf (ref 0–2)

## 2019-11-05 LAB — URINALYSIS, COMPLETE
Bilirubin, UA: NEGATIVE
Glucose, UA: NEGATIVE
Ketones, UA: NEGATIVE
Leukocytes,UA: NEGATIVE
Nitrite, UA: NEGATIVE
Protein,UA: NEGATIVE
Specific Gravity, UA: 1.025 (ref 1.005–1.030)
Urobilinogen, Ur: 0.2 mg/dL (ref 0.2–1.0)
pH, UA: 7.5 (ref 5.0–7.5)

## 2019-11-05 NOTE — Progress Notes (Signed)
11/05/2019 2:04 PM   Kurt Kelley Jul 21, 1946 [id number]  Referring provider: Jerl Mina, MD [address] [facility] [place],  [place] [address]  Chief Complaint  Patient presents with  . Hematuria    HPI: Kurt Kelley is a 73 y.o. male seen at the request of Dr. Burnett Sheng for evaluation of gross hematuria.  He states in late September while in [place] had total gross painless hematuria which lasted 1-1/2 days.  He had associated clots.  Denied dysuria.  He was having right flank pain and left groin pain.  He states he was evaluated at [facility] and had a CT with contrast performed which apparently showed no abnormalities.  He also had a scrotal ultrasound which was unremarkable.  He is followed by Dr. Kirke Corin for coronary artery disease with prior stent placement, chronic systolic heart failure due to mild ischemic cardiomyopathy and aortic stenosis.  He was on Brilinta however this was stopped after his gross hematuria.  He denies recurrent episodes.  A PSA performed by Dr. Webb Silversmith office June 2020 was 3.76.  He has a history of BPH and was on Cardura for several years.  This was discontinued by cardiology and replaced by tamsulosin which he does not feel is quite as effective.  Lower urinary tract symptoms include frequency, urgency and nocturia x3.  IPSS completed today was 15/35 with QoL rated 3/6.  He has a prior history of tobacco use and quit approximately 30 years ago.   PMH: Past Medical History:  Diagnosis Date  . Aortic stenosis    a. eCHO 04/2018: EF to 50-55%, no RWMA, Gr1DD, mild to moderate aortic stenosis with mild aortic insufficiency, mildly dilated left atrium, RVSF normal, PASP mildly elevated  . Arthritis    lower back, right knee  . CAD S/P percutaneous coronary angioplasty 2003   a. remote PCI in 2003; b. Myoview 10/18 no ischemia, EF 39%  . HLD (hyperlipidemia)    a. intolerant to statins  . Hypertension   . Ischemic  cardiomyopathy    MILD -- ECHO 04/2018: a. EF to 50-55%, no RWMA, Gr1DD, mild to moderate aortic stenosis with mild aortic insufficiency, mildly dilated left atrium, RVSF normal, PASP mildly elevated  . PAD (peripheral artery disease) (HCC)   . Sleep apnea    a. intolerant of CPAP    Surgical History: Past Surgical History:  Procedure Laterality Date  . CARDIAC CATHETERIZATION  2003   stent  . CARPAL TUNNEL RELEASE Right 10/24/2017   Procedure: CARPAL TUNNEL RELEASE ENDOSCOPIC;  Surgeon: Christena Flake, MD;  [facility];  Service: Orthopedics;  Laterality: Right;  sleep apnea  . CERVICAL SPINE SURGERY Right 01/23/2017   arthrodesis, discectomy, osteophytectomy, decompression  C3-C4, [facility]  . COLONOSCOPY    . CORONARY STENT INTERVENTION N/A 07/08/2018   Procedure: CORONARY STENT INTERVENTION;  Surgeon: Iran Ouch, MD;  Location: [facility];  Service: Cardiovascular;  Laterality: N/A;  . KNEE SURGERY Right 1966  . LEFT HEART CATH AND CORONARY ANGIOGRAPHY N/A 07/08/2018   Procedure: LEFT HEART CATH AND CORONARY ANGIOGRAPHY;  Surgeon: Iran Ouch, MD;  Location: [facility];  Service: Cardiovascular;  Laterality: N/A;  . REPLACEMENT TOTAL KNEE Left 2005    Home Medications:  Allergies as of 11/05/2019      Reactions   Ampicillin Itching   Capsicum Swelling   Paprika and black pepper both cause swelling of the lips  Medication List       Accurate as of November 05, 2019  2:04 PM. If you have any questions, ask your nurse or doctor.        albuterol 108 (90 Base) MCG/ACT inhaler Commonly known as: VENTOLIN HFA Inhale into the lungs every 6 (six) hours as needed for wheezing or shortness of breath.   amLODipine 5 MG tablet Commonly known as: NORVASC Take 1 tablet (5 mg total) by mouth daily.   amLODipine 5 MG tablet Commonly known as: NORVASC Take 1 tablet (5 mg total) by mouth daily.   aspirin 81 MG tablet Take 81 mg by  mouth daily.   azelastine 0.1 % nasal spray Commonly known as: ASTELIN PUT 1 SPRAY IN EACH NOSTRIL EVERY 12 HOURS AS NEEDED FOR ALLERGIES   ezetimibe 10 MG tablet Commonly known as: ZETIA Take 1 tablet (10 mg total) by mouth daily.   fenofibrate 145 MG tablet Commonly known as: TRICOR Take 1 tablet (145 mg total) by mouth daily.   ibuprofen 800 MG tablet Commonly known as: ADVIL Take 800 mg by mouth daily as needed.   isosorbide mononitrate 30 MG 24 hr tablet Commonly known as: IMDUR TAKE 1 TABLET BY MOUTH EVERY DAY   losartan 100 MG tablet Commonly known as: COZAAR Take 100 mg by mouth daily.   nitroGLYCERIN 0.4 MG SL tablet Commonly known as: NITROSTAT Place 1 tablet (0.4 mg total) under the tongue every 5 (five) minutes as needed for chest pain. MAXIMUM OF 3 DOSES.   Senokot S 8.6-50 MG tablet Generic drug: senna-docusate Take 1 tablet by mouth 2 (two) times daily as needed for mild constipation.   tamsulosin 0.4 MG Caps capsule Commonly known as: FLOMAX Take 1 capsule by mouth every other day.       Allergies:  Allergies  Allergen Reactions  . Ampicillin Itching  . Capsicum Swelling    Paprika and black pepper both cause swelling of the lips    Family History: Family History  Problem Relation Age of Onset  . CAD Father   . Lung cancer Father   . Kidney cancer Father   . Diabetes Mother   . Alzheimer's disease Mother     Social History:  reports that he quit smoking about 32 years ago. He has never used smokeless tobacco. He reports current alcohol use of about 11.0 standard drinks of alcohol per week. He reports previous drug use.  ROS: UROLOGY Frequent Urination?: Yes Hard to postpone urination?: Yes Burning/pain with urination?: No Get up at night to urinate?: Yes Leakage of urine?: Yes Urine stream starts and stops?: No Trouble starting stream?: No Do you have to strain to urinate?: No Blood in urine?: Yes Urinary tract infection?: No  Sexually transmitted disease?: No Injury to kidneys or bladder?: No Painful intercourse?: No Weak stream?: No Erection problems?: No Penile pain?: No  Gastrointestinal Nausea?: No Vomiting?: No Indigestion/heartburn?: No Diarrhea?: No Constipation?: No  Constitutional Fever: No Night sweats?: No Weight loss?: No Fatigue?: No  Skin Skin rash/lesions?: No Itching?: No  Eyes Blurred vision?: No Double vision?: No  Ears/Nose/Throat Sore throat?: No Sinus problems?: No  Hematologic/Lymphatic Swollen glands?: No Easy bruising?: No  Cardiovascular Leg swelling?: No Chest pain?: No  Respiratory Cough?: No Shortness of breath?: No  Endocrine Excessive thirst?: No  Musculoskeletal Back pain?: No Joint pain?: No  Neurological Headaches?: No Dizziness?: No  Psychologic Depression?: No Anxiety?: No  Physical Exam: BP (!) 192/85   Pulse 75  Ht 6\' 2"  (1.88 m)   Wt 264 lb 3.2 oz (119.8 kg)   BMI 33.92 kg/m   Constitutional:  Alert and oriented, No acute distress. HEENT: [place] AT, moist mucus membranes.  Trachea midline, no masses. Cardiovascular: No clubbing, cyanosis, or edema. Respiratory: Normal respiratory effort, no increased work of breathing. GI: Abdomen is soft, nontender, nondistended, no abdominal masses GU: Prostate 80+ cc, smooth without nodules Lymph: No cervical or inguinal lymphadenopathy. Skin: No rashes, bruises or suspicious lesions. Neurologic: Grossly intact, no focal deficits, moving all 4 extremities. Psychiatric: Normal mood and affect.   Assessment & Plan:    - Gross hematuria Urinalysis today was unremarkable.  Hematuria risk stratification is high and we discussed standard evaluation of CT urogram and cystoscopy.  He apparently had a CT with contrast and will need to obtain a copy of the report.  Will schedule cystoscopy for lower tract evaluation.    - BPH with lower urinary tract symptoms He has moderate lower urinary  tract symptoms and inquired about UroLift.  The procedure was discussed and will perform TRUS prostate at the time of cystoscopy.   Abbie Sons, [place] [address] [place], [place] [address] [phone]

## 2019-11-27 ENCOUNTER — Other Ambulatory Visit: Payer: PPO | Admitting: Urology

## 2019-12-11 ENCOUNTER — Other Ambulatory Visit: Payer: Self-pay

## 2019-12-11 MED ORDER — LOSARTAN POTASSIUM 100 MG PO TABS: 100.0000 mg | ORAL_TABLET | Freq: Every day | ORAL | 0 refills | Status: DC

## 2019-12-17 ENCOUNTER — Other Ambulatory Visit: Payer: Self-pay | Admitting: Cardiovascular Disease

## 2019-12-27 ENCOUNTER — Other Ambulatory Visit: Payer: Self-pay | Admitting: Cardiovascular Disease

## 2020-01-05 ENCOUNTER — Encounter: Payer: Self-pay | Admitting: Urology

## 2020-01-05 ENCOUNTER — Ambulatory Visit: Payer: PPO | Admitting: Urology

## 2020-01-05 ENCOUNTER — Other Ambulatory Visit: Payer: Self-pay

## 2020-01-05 VITALS — BP 154/72 | HR 76 | Ht 74.0 in | Wt 266.4 lb

## 2020-01-05 DIAGNOSIS — R310 Gross hematuria: Secondary | ICD-10-CM

## 2020-01-05 DIAGNOSIS — M722 Plantar fascial fibromatosis: Secondary | ICD-10-CM | POA: Insufficient documentation

## 2020-01-05 DIAGNOSIS — B019 Varicella without complication: Secondary | ICD-10-CM | POA: Insufficient documentation

## 2020-01-05 DIAGNOSIS — N401 Enlarged prostate with lower urinary tract symptoms: Secondary | ICD-10-CM | POA: Insufficient documentation

## 2020-01-05 DIAGNOSIS — K219 Gastro-esophageal reflux disease without esophagitis: Secondary | ICD-10-CM | POA: Insufficient documentation

## 2020-01-05 LAB — URINALYSIS, COMPLETE
Bilirubin, UA: NEGATIVE
Glucose, UA: NEGATIVE
Ketones, UA: NEGATIVE
Leukocytes,UA: NEGATIVE
Nitrite, UA: NEGATIVE
Protein,UA: NEGATIVE
Specific Gravity, UA: 1.025 (ref 1.005–1.030)
Urobilinogen, Ur: 0.2 mg/dL (ref 0.2–1.0)
pH, UA: 5.5 (ref 5.0–7.5)

## 2020-01-05 LAB — MICROSCOPIC EXAMINATION: Bacteria, UA: NONE SEEN

## 2020-01-05 MED ORDER — LIDOCAINE HCL URETHRAL/MUCOSAL 2 % EX GEL
1.0000 "application " | Freq: Once | CUTANEOUS | Status: AC
  Administered 2020-01-05: 1 via URETHRAL

## 2020-01-05 NOTE — Progress Notes (Signed)
   01/05/20  CC:  Chief Complaint  Patient presents with  . Cysto    Indications: Gross hematuria  HPI: Refer to my previous office note 11/05/2019.  He had a recurrent episode of hematuria in early December.  CT report has not been received.  Blood pressure (!) 154/72, pulse 76, height 6\' 2"  (1.88 m), weight 266 lb 6.4 oz (120.8 kg). NED. A&Ox3.   No respiratory distress   Abd soft, NT, ND Normal phallus with bilateral descended testicles  Cystoscopy Procedure Note  Patient identification was confirmed, informed consent was obtained, and patient was prepped using Betadine solution.  Lidocaine jelly was administered per urethral meatus.     Pre-Procedure: - Inspection reveals a normal caliber urethral meatus.  Procedure: The flexible cystoscope was introduced without difficulty - No urethral strictures/lesions are present. - Marked lateral lobe enlargement prostate; hypervascularity, inflammatory changes prostatic urethra - Elevated bladder neck - Bilateral ureteral orifices identified - Bladder mucosa  reveals no ulcers, tumors, or lesions - No bladder stones -Moderate trabeculation  Retroflexion shows moderate intravesical median lobe   Post-Procedure: - Patient tolerated the procedure well  Assessment/ Plan: -Marked BPH which is the most likely source of hematuria -Urine cytology sent -He complains of bilateral groin discomfort.  CT from has not been received.  He is not sure if he signed a release and will have him sign a resend at checkout -He inquired about outlet procedures for his enlarged prostate.  He would most likely need HOLEP -We will await CT report to see if volume or dimensions of his prostate were measured and if not will need a transrectal ultrasound prostate. -Start finasteride 5 mg daily -Follow-up 4 weeks with [place] for recheck  Carollee Herter, MD

## 2020-01-06 ENCOUNTER — Other Ambulatory Visit: Payer: Self-pay

## 2020-01-06 ENCOUNTER — Encounter: Payer: Self-pay | Admitting: Urology

## 2020-01-06 DIAGNOSIS — N401 Enlarged prostate with lower urinary tract symptoms: Secondary | ICD-10-CM

## 2020-01-06 MED ORDER — FINASTERIDE 5 MG PO TABS: 5.0000 mg | ORAL_TABLET | Freq: Every day | ORAL | 11 refills | Status: DC

## 2020-01-06 NOTE — Telephone Encounter (Signed)
Rx sent per providers note.

## 2020-01-08 ENCOUNTER — Other Ambulatory Visit: Payer: Self-pay | Admitting: Urology

## 2020-01-12 ENCOUNTER — Telehealth: Payer: Self-pay | Admitting: Urology

## 2020-01-12 NOTE — Telephone Encounter (Signed)
Urine cytology showed no abnormal cells 

## 2020-01-12 NOTE — Telephone Encounter (Signed)
Notified patient as instructed, patient pleased. Discussed follow-up appointments, patient agrees  

## 2020-01-15 ENCOUNTER — Ambulatory Visit: Payer: PPO | Admitting: Cardiovascular Disease

## 2020-01-15 ENCOUNTER — Other Ambulatory Visit: Payer: Self-pay

## 2020-01-15 ENCOUNTER — Encounter: Payer: Self-pay | Admitting: Cardiovascular Disease

## 2020-01-15 VITALS — BP 146/90 | HR 75 | Ht 74.0 in | Wt 264.0 lb

## 2020-01-15 DIAGNOSIS — I359 Nonrheumatic aortic valve disorder, unspecified: Secondary | ICD-10-CM

## 2020-01-15 DIAGNOSIS — I25118 Atherosclerotic heart disease of native coronary artery with other forms of angina pectoris: Secondary | ICD-10-CM

## 2020-01-15 DIAGNOSIS — E782 Mixed hyperlipidemia: Secondary | ICD-10-CM | POA: Diagnosis not present

## 2020-01-15 DIAGNOSIS — I255 Ischemic cardiomyopathy: Secondary | ICD-10-CM

## 2020-01-15 DIAGNOSIS — I10 Essential (primary) hypertension: Secondary | ICD-10-CM

## 2020-01-15 NOTE — Patient Instructions (Signed)
Medication Instructions:  Your physician recommends that you continue on your current medications as directed. Please refer to the Current Medication list given to you today.  *If you need a refill on your cardiac medications before your next appointment, please call your pharmacy*  Lab Work: CMP and CBC today If you have labs (blood work) drawn today and your tests are completely normal, you will receive your results only by: Marland Kitchen MyChart Message (if you have MyChart) OR . A paper copy in the mail If you have any lab test that is abnormal or we need to change your treatment, we will call you to review the results.  Testing/Procedures: Your physician has requested that you have an echocardiogram. Echocardiography is a painless test that uses sound waves to create images of your heart. It provides your doctor with information about the size and shape of your heart and how well your heart's chambers and valves are working. This procedure takes approximately one hour. There are no restrictions for this procedure. ( To be scheduled in June 2021)   Follow-Up: At [facility], you and your health needs are our priority.  As part of our continuing mission to provide you with exceptional heart care, we have created designated Provider Care Teams.  These Care Teams include your primary Cardiologist (physician) and Advanced Practice Providers (APPs -  Physician Assistants and Nurse Practitioners) who all work together to provide you with the care you need, when you need it.  Your next appointment:   6 month(s)  The format for your next appointment:   In Person  Provider:    You may see Lorine Bears, MD or one of the following Advanced Practice Providers on your designated Care Team:    Nicolasa Ducking, NP  Eula Listen, PA-C  Marisue Ivan, PA-C   Other Instructions N/A

## 2020-01-15 NOTE — Progress Notes (Signed)
Cardiology Office Note   Date:  01/15/2020   ID:  ANDRON MARRAZZO, DOB 1946/01/17, MRN [id number]  PCP:  [place] Pink, MD  Cardiologist:  Kathlyn Sacramento, MD   Chief Complaint  Patient presents with  . Other    3 month follow up. Patient c/o SOB when lying on back at night and coughing alot only at night. Meds reviewed verbally with patient.       History of Present Illness: Kurt Kelley is a 74 y.o. male who is here today for a follow-up visit regarding coronary artery disease, chronic systolic heart failure and aortic stenosis.  The patient has known history of coronary artery disease with previous stent placement in 8786, chronic systolic heart failure due to mild ischemic cardiomyopathy, aortic stenosis, essential hypertension, hyperlipidemia, sleep apnea and obesity.  He has known history of intolerance to statins due to severe myalgia.  He also reports intolerance to CPAP due to claustrophobia.  He is aware of history of peripheral arterial disease and was seen many years ago by Dr. Deon Pilling and was told about moderate 50% disease that did not require revascularization. Vascular evaluation in May 2019 showed an ABI of 1.05 on the right and 0.97 on the left.  Duplex showed mild atherosclerosis in the right lower extremity.  On the left, there was moderate disease affecting the distal SFA. The patient was hospitalized in July 2019 with non-ST elevation myocardial infarction.  Cardiac catheterization showed mild LAD disease, 95% stenosis in the mid left circumflex, patent RCA stent and 60% distal RCA stenosis.  I performed successful angioplasty and drug-eluting stent placement to the left circumflex.  There was evidence of moderate aortic stenosis with a peak gradient of 15 to 20 mmHg.  EF was 50 to 55%.  The patient did not tolerate beta-blockers.  He was started on Repatha but unfortunately was not able to continue due to cost.  He had hematuria on dual antiplatelet therapy that  improved after discontinuing Brilinta.  During last visit, he complained of exertional chest pain.  Carlton Adam Myoview was done in November which showed no evidence of ischemia.  I also added Zetia 10 mg daily during last visit. He did undergo cystoscopy which showed marked BPH felt to be the source of hematuria.  There was no evidence of tumors.  He has been doing reasonably well with no recent chest pain.  He describes stable exertional dyspnea.  He seems to be bothered by nocturnal cough and may be some orthopnea.  He is known to have sleep apnea and does not tolerate CPAP due to claustrophobia.    Past Medical History:  Diagnosis Date  . Aortic stenosis    a. eCHO 04/2018: EF to 50-55%, no RWMA, Gr1DD, mild to moderate aortic stenosis with mild aortic insufficiency, mildly dilated left atrium, RVSF normal, PASP mildly elevated  . Arthritis    lower back, right knee  . CAD S/P percutaneous coronary angioplasty 2003   a. remote PCI in 2003; b. Myoview 10/18 no ischemia, EF 39%  . HLD (hyperlipidemia)    a. intolerant to statins  . Hypertension   . Ischemic cardiomyopathy    MILD -- ECHO 04/2018: a. EF to 50-55%, no RWMA, Gr1DD, mild to moderate aortic stenosis with mild aortic insufficiency, mildly dilated left atrium, RVSF normal, PASP mildly elevated  . PAD (peripheral artery disease) ([place])   . Sleep apnea    a. intolerant of CPAP    Past Surgical History:  Procedure  Laterality Date  . CARDIAC CATHETERIZATION  2003   stent  . CARPAL TUNNEL RELEASE Right 10/24/2017   Procedure: CARPAL TUNNEL RELEASE ENDOSCOPIC;  Surgeon: Christena Flake, MD;  Location: [facility];  Service: Orthopedics;  Laterality: Right;  sleep apnea  . CERVICAL SPINE SURGERY Right 01/23/2017   arthrodesis, discectomy, osteophytectomy, decompression  C3-C4, [facility]  . COLONOSCOPY    . CORONARY STENT INTERVENTION N/A 07/08/2018   Procedure: CORONARY STENT INTERVENTION;  Surgeon: Iran Ouch, MD;  Location:  [facility];  Service: Cardiovascular;  Laterality: N/A;  . KNEE SURGERY Right 1966  . LEFT HEART CATH AND CORONARY ANGIOGRAPHY N/A 07/08/2018   Procedure: LEFT HEART CATH AND CORONARY ANGIOGRAPHY;  Surgeon: Iran Ouch, MD;  Location: [facility] INVASIVE CV LAB;  Service: Cardiovascular;  Laterality: N/A;  . REPLACEMENT TOTAL KNEE Left 2005     Current Outpatient Medications  Medication Sig Dispense Refill  . albuterol (PROVENTIL HFA;VENTOLIN HFA) 108 (90 Base) MCG/ACT inhaler Inhale into the lungs every 6 (six) hours as needed for wheezing or shortness of breath.    Marland Kitchen amLODipine (NORVASC) 5 MG tablet Take 5 mg by mouth daily.    Marland Kitchen aspirin 81 MG tablet Take 81 mg by mouth daily.    . fenofibrate (TRICOR) 145 MG tablet Take 1 tablet (145 mg total) by mouth daily. 90 tablet 2  . finasteride (PROSCAR) 5 MG tablet Take 1 tablet (5 mg total) by mouth daily. 30 tablet 11  . fluticasone (FLONASE) 50 MCG/ACT nasal spray SPRAY TWO SPRAYS IN EACH NOSTRIL ONCE DAILY    . isosorbide mononitrate (IMDUR) 30 MG 24 hr tablet TAKE 1 TABLET BY MOUTH EVERY DAY 90 tablet 0  . losartan (COZAAR) 100 MG tablet Take 1 tablet (100 mg total) by mouth daily. 90 tablet 0  . nitroGLYCERIN (NITROSTAT) 0.4 MG SL tablet Place 1 tablet (0.4 mg total) under the tongue every 5 (five) minutes as needed for chest pain. MAXIMUM OF 3 DOSES. 25 tablet 1  . tamsulosin (FLOMAX) 0.4 MG CAPS capsule Take 1 capsule by mouth daily.   11  . ezetimibe (ZETIA) 10 MG tablet Take 1 tablet (10 mg total) by mouth daily. 90 tablet 3   No current facility-administered medications for this visit.    Allergies:   Ampicillin and Capsicum    Social History:  The patient  reports that he quit smoking about 33 years ago. He has never used smokeless tobacco. He reports current alcohol use of about 11.0 standard drinks of alcohol per week. He reports previous drug use.   Family History:  The patient's family history includes Alzheimer's  disease in his mother; CAD in his father; Diabetes in his mother; Kidney cancer in his father; Lung cancer in his father.    ROS:  Please see the history of present illness.   Otherwise, review of systems are positive for none.   All other systems are reviewed and negative.    PHYSICAL EXAM: VS:  BP (!) 146/90 (BP Location: Left Arm, Patient Position: Sitting, Cuff Size: Normal)   Pulse 75   Ht 6\' 2"  (1.88 m)   Wt 264 lb (119.7 kg)   BMI 33.90 kg/m  , BMI Body mass index is 33.9 kg/m. GEN: Well nourished, well developed, in no acute distress  HEENT: normal  Neck: no JVD, carotid bruits, or masses Cardiac: RRR; no rubs, or gallops,no edema . 2 /6 systolic ejection murmur in the aortic area which is mid  peaking with mildly diminished S2 Respiratory:  clear to auscultation bilaterally, normal work of breathing GI: soft, nontender, nondistended, + BS MS: no deformity or atrophy  Skin: warm and dry, no rash Neuro:  Strength and sensation are intact Psych: euthymic mood, full affect    EKG:  EKG is ordered today. The EKG today demonstrates normal sinus rhythm with nonspecific ST changes.  Recent Labs: No results found for requested labs within last 8760 hours.    Lipid Panel    Component Value Date/Time   CHOL 124 07/06/2018 0530   TRIG 341 (H) 07/06/2018 0530   HDL 28 (L) 07/06/2018 0530   CHOLHDL 4.4 07/06/2018 0530   VLDL 68 (H) 07/06/2018 0530   LDLCALC 28 07/06/2018 0530      Wt Readings from Last 3 Encounters:  01/15/20 264 lb (119.7 kg)  01/05/20 266 lb 6.4 oz (120.8 kg)  11/05/19 264 lb 3.2 oz (119.8 kg)       PAD Screen 04/09/2018  Previous PAD dx? No  Previous surgical procedure? No  Pain with walking? Yes  Subsides with rest? No  Feet/toe relief with dangling? No  Painful, non-healing ulcers? No  Extremities discolored? Yes      ASSESSMENT AND PLAN:  1.  Coronary artery disease involving native coronary arteries with other forms of angina:  He  has residual 60% disease .  However, recent [facility] showed no evidence of ischemia.  Continue medical therapy.  2. Peripheral arterial disease: The patient has mildly reduced ABI on the left side with evidence of borderline significant disease affecting the left SFA.   Currently with no claudications.  Continue medical therapy.  3.   moderate aortic stenosis: Echocardiogram was done in June of 2020 which showed an EF of 60 to 65%.  Aortic stenosis was moderate with a mean gradient of 20 mmHg and valve area of 1.28.  Repeat echocardiogram in June of this year.  4.  Chronic systolic heart failure: Currently on losartan.  Most recent EF was normal.    He did not tolerate beta-blockers.  5.  Hyperlipidemia: Intolerance to statins.  Unfortunately, he could not afford Repatha.  Continue Zetia and fenofibrate.  I requested lipid and liver profile today.  6.  Essential hypertension: Blood pressure continues to be mildly elevated.  He is currently on Imdur, losartan and amlodipine.  We should consider adding a thiazide diuretic if blood pressure remains elevated.  I suspect that untreated sleep apnea is contributing.     Disposition:   FU with me in 6 months     Signed, Lorine Bears, MD 01/15/20 [facility] [place], [place] [phone]

## 2020-01-16 ENCOUNTER — Other Ambulatory Visit: Payer: Self-pay

## 2020-01-16 ENCOUNTER — Encounter: Payer: Self-pay | Admitting: Urology

## 2020-01-16 DIAGNOSIS — E782 Mixed hyperlipidemia: Secondary | ICD-10-CM

## 2020-01-16 LAB — COMPREHENSIVE METABOLIC PANEL
ALT: 40 IU/L (ref 0–44)
AST: 19 IU/L (ref 0–40)
Albumin/Globulin Ratio: 2.3 — ABNORMAL HIGH (ref 1.2–2.2)
Albumin: 5 g/dL — ABNORMAL HIGH (ref 3.7–4.7)
Alkaline Phosphatase: 80 IU/L (ref 39–117)
BUN/Creatinine Ratio: 17 (ref 10–24)
BUN: 18 mg/dL (ref 8–27)
Bilirubin Total: 0.6 mg/dL (ref 0.0–1.2)
CO2: 21 mmol/L (ref 20–29)
Calcium: 10.2 mg/dL (ref 8.6–10.2)
Chloride: 105 mmol/L (ref 96–106)
Creatinine, Ser: 1.08 mg/dL (ref 0.76–1.27)
GFR calc Af Amer: 79 mL/min/{1.73_m2} (ref >59)
GFR calc non Af Amer: 68 mL/min/{1.73_m2} (ref >59)
Globulin, Total: 2.2 g/dL (ref 1.5–4.5)
Glucose: 123 mg/dL — ABNORMAL HIGH (ref 65–99)
Potassium: 4.9 mmol/L (ref 3.5–5.2)
Sodium: 139 mmol/L (ref 134–144)
Total Protein: 7.2 g/dL (ref 6.0–8.5)

## 2020-01-16 LAB — CBC WITH DIFFERENTIAL/PLATELET
Basophils Absolute: 0.1 10*3/uL (ref 0.0–0.2)
Basos: 1 %
EOS (ABSOLUTE): 0.2 10*3/uL (ref 0.0–0.4)
Eos: 3 %
Hematocrit: 49.9 % (ref 37.5–51.0)
Hemoglobin: 17.3 g/dL (ref 13.0–17.7)
Immature Grans (Abs): 0 10*3/uL (ref 0.0–0.1)
Immature Granulocytes: 1 %
Lymphocytes Absolute: 2 10*3/uL (ref 0.7–3.1)
Lymphs: 28 %
MCH: 28.3 pg (ref 26.6–33.0)
MCHC: 34.7 g/dL (ref 31.5–35.7)
MCV: 82 fL (ref 79–97)
Monocytes Absolute: 0.7 10*3/uL (ref 0.1–0.9)
Monocytes: 9 %
Neutrophils Absolute: 4.3 10*3/uL (ref 1.4–7.0)
Neutrophils: 58 %
Platelets: 212 10*3/uL (ref 150–450)
RBC: 6.11 x10E6/uL — ABNORMAL HIGH (ref 4.14–5.80)
RDW: 13 % (ref 11.6–15.4)
WBC: 7.3 10*3/uL (ref 3.4–10.8)

## 2020-01-29 ENCOUNTER — Telehealth: Payer: Self-pay

## 2020-01-29 NOTE — Telephone Encounter (Signed)
Patient's wife called stating that patient has passed two large blood clots today. He denies any urinary symptoms and no fever,chills, nausea or vomiting. Urine is yellow in color, denies thick clotted blood. Patient's wife was informed that due to patient's enlarged prostate seeing blood or clots after cysto procedure can be normal. He is to follow up with shannon next week for recheck on symptoms. She was instructed for him to keep this appointment and call us if his symptoms worsened.

## 2020-02-04 ENCOUNTER — Encounter: Payer: Self-pay | Admitting: Urology

## 2020-02-04 ENCOUNTER — Other Ambulatory Visit: Payer: Self-pay

## 2020-02-04 ENCOUNTER — Ambulatory Visit: Payer: PPO | Admitting: Urology

## 2020-02-04 VITALS — BP 153/72 | HR 82 | Ht 74.0 in | Wt 266.0 lb

## 2020-02-04 DIAGNOSIS — R310 Gross hematuria: Secondary | ICD-10-CM

## 2020-02-04 DIAGNOSIS — N401 Enlarged prostate with lower urinary tract symptoms: Secondary | ICD-10-CM | POA: Diagnosis not present

## 2020-02-04 LAB — URINALYSIS, COMPLETE
Bilirubin, UA: NEGATIVE
Glucose, UA: NEGATIVE
Nitrite, UA: POSITIVE — AB
Specific Gravity, UA: 1.02 (ref 1.005–1.030)
Urobilinogen, Ur: 1 mg/dL (ref 0.2–1.0)
pH, UA: 7 (ref 5.0–7.5)

## 2020-02-04 LAB — MICROSCOPIC EXAMINATION: RBC, Urine: >30 /hpf — AB (ref 0–2)

## 2020-02-04 LAB — BLADDER SCAN AMB NON-IMAGING

## 2020-02-04 MED ORDER — CEFUROXIME AXETIL 500 MG PO TABS: 500.0000 mg | ORAL_TABLET | Freq: Two times a day (BID) | ORAL | 0 refills | Status: DC

## 2020-02-04 NOTE — Progress Notes (Signed)
02/04/2020 3:41 PM   Kurt Kelley 1946-11-07 [id number]  Referring provider: Jerl Mina, MD [address] [facility] [place],  [place] [address]  Chief Complaint  Kurt Kelley presents with  . Hematuria    30mo follow up    HPI: Kurt Kelley is a 74 year old male with a history of hematuria and BPH with LU TS who presents today with a complaint of seven days of intermittent gross hematuria with clots.  He states he been experiencing frequency, urgency, dysuria, nocturia, incontinence, intermittency, gross hematuria, weak urinary stream and penile pain due to passage of clots x 7 days.   He is also experiencing suprapubic discomfort and he feels weak.  Kurt Kelley denies any modifying or aggravating factors.  Kurt Kelley denies any flank pain.  Kurt Kelley denies any fevers, chills, nausea or vomiting.   He is concerned that he is losing too much blood and that he may have prostate cancer.   UA red cloudy, trace ketone, 1.020 specific gravity, 3+ blood, pH is 7.0, 3+ protein, nitrate positive, leukocyte +1, 11-30 WBCs, greater than 30 RBCs, 0-10 epithelial cells and few bacteria.  I PSS score 24/6.   PVR is 115 mL.    IPSS    Row Name 02/04/20 1300         International Prostate Symptom Score   How often have you had the sensation of not emptying your bladder?  Almost always     How often have you had to urinate less than every two hours?  Almost always     How often have you found you stopped and started again several times when you urinated?  Less than half the time     How often have you found it difficult to postpone urination?  Almost always     How often have you had a weak urinary stream?  About half the time     How many times did you typically get up at night to urinate?  4 Times     Total IPSS Score  24       Quality of Life due to urinary symptoms   If you were to spend the rest of your life with your urinary condition just the way it is now how would you feel about  that?  Terrible        Score:  1-7 Mild 8-19 Moderate 20-35 Severe  PMH: Past Medical History:  Diagnosis Date  . Aortic stenosis    a. eCHO 04/2018: EF to 50-55%, no RWMA, Gr1DD, mild to moderate aortic stenosis with mild aortic insufficiency, mildly dilated left atrium, RVSF normal, PASP mildly elevated  . Arthritis    lower back, right knee  . CAD S/P percutaneous coronary angioplasty 2003   a. remote PCI in 2003; b. Myoview 10/18 no ischemia, EF 39%  . HLD (hyperlipidemia)    a. intolerant to statins  . Hypertension   . Ischemic cardiomyopathy    MILD -- ECHO 04/2018: a. EF to 50-55%, no RWMA, Gr1DD, mild to moderate aortic stenosis with mild aortic insufficiency, mildly dilated left atrium, RVSF normal, PASP mildly elevated  . PAD (peripheral artery disease) (HCC)   . Sleep apnea    a. intolerant of CPAP    Surgical History: Past Surgical History:  Procedure Laterality Date  . CARDIAC CATHETERIZATION  2003   stent  . CARPAL TUNNEL RELEASE Right 10/24/2017   Procedure: CARPAL TUNNEL RELEASE ENDOSCOPIC;  Surgeon: Christena Flake, MD;  Location: [facility];  Service: Orthopedics;  Laterality: Right;  sleep apnea  . CERVICAL SPINE SURGERY Right 01/23/2017   arthrodesis, discectomy, osteophytectomy, decompression  C3-C4, [facility]  . COLONOSCOPY    . CORONARY STENT INTERVENTION N/A 07/08/2018   Procedure: CORONARY STENT INTERVENTION;  Surgeon: Iran Ouch, MD;  Location: [facility];  Service: Cardiovascular;  Laterality: N/A;  . KNEE SURGERY Right 1966  . LEFT HEART CATH AND CORONARY ANGIOGRAPHY N/A 07/08/2018   Procedure: LEFT HEART CATH AND CORONARY ANGIOGRAPHY;  Surgeon: Iran Ouch, MD;  Location: [facility];  Service: Cardiovascular;  Laterality: N/A;  . REPLACEMENT TOTAL KNEE Left 2005    Home Medications:  Allergies as of 02/04/2020      Reactions   Ampicillin Itching   Capsicum Swelling   Paprika and black pepper both cause  swelling of the lips      Medication List       Accurate as of February 04, 2020  3:41 PM. If you have any questions, ask your nurse or doctor.        albuterol 108 (90 Base) MCG/ACT inhaler Commonly known as: VENTOLIN HFA Inhale into the lungs every 6 (six) hours as needed for wheezing or shortness of breath.   amLODipine 5 MG tablet Commonly known as: NORVASC Take 5 mg by mouth daily.   aspirin 81 MG tablet Take 81 mg by mouth daily.   cefUROXime 500 MG tablet Commonly known as: CEFTIN Take 1 tablet (500 mg total) by mouth 2 (two) times daily with a meal. Started by: Michiel Cowboy, PA-C   ezetimibe 10 MG tablet Commonly known as: ZETIA Take 1 tablet (10 mg total) by mouth daily.   fenofibrate 145 MG tablet Commonly known as: TRICOR Take 1 tablet (145 mg total) by mouth daily.   finasteride 5 MG tablet Commonly known as: PROSCAR Take 1 tablet (5 mg total) by mouth daily.   fluticasone 50 MCG/ACT nasal spray Commonly known as: FLONASE SPRAY TWO SPRAYS IN EACH NOSTRIL ONCE DAILY   isosorbide mononitrate 30 MG 24 hr tablet Commonly known as: IMDUR TAKE 1 TABLET BY MOUTH EVERY DAY   losartan 100 MG tablet Commonly known as: COZAAR Take 1 tablet (100 mg total) by mouth daily.   nitroGLYCERIN 0.4 MG SL tablet Commonly known as: NITROSTAT Place 1 tablet (0.4 mg total) under the tongue every 5 (five) minutes as needed for chest pain. MAXIMUM OF 3 DOSES.   tamsulosin 0.4 MG Caps capsule Commonly known as: FLOMAX Take 1 capsule by mouth daily.       Allergies:  Allergies  Allergen Reactions  . Ampicillin Itching  . Capsicum Swelling    Paprika and black pepper both cause swelling of the lips    Family History: Family History  Problem Relation Age of Onset  . CAD Father   . Lung cancer Father   . Kidney cancer Father   . Diabetes Mother   . Alzheimer's disease Mother     Social History:  reports that he quit smoking about 33 years ago. He has  never used smokeless tobacco. He reports current alcohol use of about 11.0 standard drinks of alcohol per week. He reports previous drug use.  ROS: UROLOGY Frequent Urination?: Yes Hard to postpone urination?: Yes Burning/pain with urination?: Yes Get up at night to urinate?: Yes Leakage of urine?: Yes Urine stream starts and stops?: Yes Trouble starting stream?: No Do you have to strain to urinate?: No  Blood in urine?: Yes Urinary tract infection?: No Sexually transmitted disease?: No Injury to kidneys or bladder?: No Painful intercourse?: No Weak stream?: Yes Erection problems?: No Penile pain?: Yes  Gastrointestinal Nausea?: No Vomiting?: No Indigestion/heartburn?: No Diarrhea?: No Constipation?: No  Constitutional Fever: No Night sweats?: No Weight loss?: No Fatigue?: No  Skin Skin rash/lesions?: No Itching?: No  Eyes Blurred vision?: No Double vision?: No  Ears/Nose/Throat Sore throat?: No Sinus problems?: No  Hematologic/Lymphatic Swollen glands?: No Easy bruising?: Yes  Cardiovascular Leg swelling?: No Chest pain?: No  Respiratory Cough?: No Shortness of breath?: Yes  Endocrine Excessive thirst?: No  Musculoskeletal Back pain?: Yes Joint pain?: No  Neurological Headaches?: No Dizziness?: No  Psychologic Depression?: No Anxiety?: No  Physical Exam: BP (!) 153/72   Pulse 82   Ht 6\' 2"  (1.88 m)   Wt 266 lb (120.7 kg)   BMI 34.15 kg/m   Constitutional:  Well nourished. Alert and oriented, No acute distress. HEENT: [place] AT, mask in place.  Trachea midline, no masses. Cardiovascular: No clubbing, cyanosis, or edema. Respiratory: Normal respiratory effort, no increased work of breathing. GI: Abdomen is soft, non tender, non distended, no abdominal masses.  GU: No CVA tenderness.  No bladder fullness or masses.  Kurt Kelley with circumcised phallus. Urethral meatus is patent.  No penile discharge. No penile lesions or rashes. Scrotum  without lesions, cysts, rashes and/or edema.   Neurologic: Grossly intact, no focal deficits, moving all 4 extremities. Psychiatric: Normal mood and affect.  Laboratory Data: Lab Results  Component Value Date   WBC 7.3 01/15/2020   HGB 17.3 01/15/2020   HCT 49.9 01/15/2020   MCV 82 01/15/2020   PLT 212 01/15/2020    Lab Results  Component Value Date   CREATININE 1.08 01/15/2020    Lab Results  Component Value Date   HGBA1C 7.5 (H) 07/05/2018       Component Value Date/Time   CHOL 124 07/06/2018 0530   HDL 28 (L) 07/06/2018 0530   CHOLHDL 4.4 07/06/2018 0530   VLDL 68 (H) 07/06/2018 0530   LDLCALC 28 07/06/2018 0530    Lab Results  Component Value Date   AST 19 01/15/2020   Lab Results  Component Value Date   ALT 40 01/15/2020    Urinalysis Component     Latest Ref Rng & Units 02/04/2020  Specific Gravity, UA     1.005 - 1.030 1.020  pH, UA     5.0 - 7.5 7.0  Color, UA     Yellow Red (A)  Appearance Ur     Clear Cloudy (A)  Leukocytes,UA     Negative 1+ (A)  Protein,UA     Negative/Trace 3+ (A)  Glucose, UA     Negative Negative  Ketones, UA     Negative Trace (A)  RBC, UA     Negative 3+ (A)  Bilirubin, UA     Negative Negative  Urobilinogen, Ur     0.2 - 1.0 mg/dL 1.0  Nitrite, UA     Negative Positive (A)  Microscopic Examination      See below:   Component     Latest Ref Rng & Units 02/04/2020  WBC, UA     0 - 5 /hpf 11-30 (A)  RBC     0 - 2 /hpf >30 (A)  Epithelial Cells (non renal)     0 - 10 /hpf 0-10  Bacteria, UA     None seen/Few Few  I have reviewed the labs.   Pertinent Imaging: Results for NOLAND, PIZANO (MRN [id number]) as of 02/04/2020 15:33  Ref. Range 02/04/2020 13:41  Scan Result Unknown   I have independently reviewed the films.    Bladder Irrigation Due to gross hematuria with clots Kurt Kelley is present today for a bladder irrigation. Kurt Kelley was cleaned and prepped in a sterile fashion. 800 ml of  saline/sterile water was instilled and irrigated into the bladder with a 16ml [place]y syringe through the 6 eye catheter in place.  10 cc of old clots was retrieved and urine returned clear.  After irrigation urine flow was noted no complications were noted catheter is now draining fine.  He tolerated the procedure well.  Performed by: Michiel Cowboy, PA-C   Assessment & Plan:    1. Gross hematuria - secondary to marked BPH - continue finasteride 5 mg daily - Urinalysis, Complete  - Urine culture - started on Ceftin empirically advised to stop if he starts to experience itching and contact the office will adjust to appropriate antibiotic if needed - will check HCT and hemoglobin at Kurt Kelley's request  2. Benign prostatic hyperplasia with lower urinary tract symptoms, symptom details unspecified - BLADDER SCAN AMB NON-IMAGING - schedule TRUS in preparation for HoLEP - will check PSA at Kurt Kelley's request although I explained to him in may be falsely elevated due to the irritation of his prostate, but he would be more at ease knowing what his PSA is at this time - PSA drawn before bladder irrigation   Return for return for TRUS .  These notes generated with voice recognition software. I apologize for typographical errors.  Michiel Cowboy, PA-C  [facility] [address]  [address] [place], [place] [address] [phone]

## 2020-02-05 ENCOUNTER — Telehealth: Payer: Self-pay | Admitting: Family Medicine

## 2020-02-05 LAB — HEMOGLOBIN: Hemoglobin: 16.7 g/dL (ref 13.0–17.7)

## 2020-02-05 LAB — PSA: Prostate Specific Ag, Serum: 5.8 ng/mL — ABNORMAL HIGH (ref 0.0–4.0)

## 2020-02-05 LAB — HEMATOCRIT: Hematocrit: 48.8 % (ref 37.5–51.0)

## 2020-02-05 NOTE — Telephone Encounter (Signed)
-----   Message from Harle Battiest, PA-C sent at 02/05/2020  9:06 AM EST ----- Please let Kurt Kelley know that his HCT and hemoglobin are normal, so he is not losing a lot of blood in his urine.  His PSA is elevated but as we talked about yesterday, it might be elevated due to an infection.  He should continue the antibiotic and we should have the culture results next week.  Is he still having blood in his urine?

## 2020-02-05 NOTE — Telephone Encounter (Signed)
Spoke to patient and he is not having blood in his urine this afternoon. He will continue ABX.

## 2020-02-07 LAB — CULTURE, URINE COMPREHENSIVE

## 2020-02-09 ENCOUNTER — Telehealth: Payer: Self-pay | Admitting: Family Medicine

## 2020-02-09 NOTE — Telephone Encounter (Signed)
LMOM for patient to return call.

## 2020-02-09 NOTE — Telephone Encounter (Signed)
Patient returned call, informed. Voiced understanding.

## 2020-02-09 NOTE — Telephone Encounter (Signed)
-----   Message from Harle Battiest, PA-C sent at 02/09/2020  8:20 AM EST ----- Please let Kurt Kelley know that his urine culture is positive for infection and the antibiotic that was prescribed should take care of the infection.  If he is not feeling better, I need to change his antibiotic.  If he is feeling better, we will see him on the 26th for the ultrasound of his prostate.

## 2020-02-16 ENCOUNTER — Other Ambulatory Visit: Payer: Self-pay | Admitting: Cardiovascular Disease

## 2020-02-20 ENCOUNTER — Ambulatory Visit: Payer: PPO | Admitting: Urology

## 2020-02-20 ENCOUNTER — Encounter: Payer: Self-pay | Admitting: Urology

## 2020-02-20 ENCOUNTER — Other Ambulatory Visit: Payer: Self-pay

## 2020-02-20 VITALS — BP 161/79 | HR 80 | Ht 71.0 in | Wt 266.0 lb

## 2020-02-20 DIAGNOSIS — R3914 Feeling of incomplete bladder emptying: Secondary | ICD-10-CM | POA: Diagnosis not present

## 2020-02-20 DIAGNOSIS — N401 Enlarged prostate with lower urinary tract symptoms: Secondary | ICD-10-CM | POA: Diagnosis not present

## 2020-02-20 NOTE — Progress Notes (Signed)
02/20/2020 3:49 PM   Kurt Kelley [birth year] [id number]  Referring provider: Jerl Mina, MD [address] [facility] [place],  [place] [address]  Chief Complaint  Patient presents with  . trus    HPI: 74 y.o. male previously seen by [facility] for gross hematuria.  Cystoscopy performed last month remarkable for marked lateral lobe enlargement, hypervascularity and inflammatory changes of the prostatic urethra.  IPSS 24/35.  PVR 115 mL.  He is scheduled today for transrectal ultrasound prostate for volume determination.   PMH: Past Medical History:  Diagnosis Date  . Aortic stenosis    a. eCHO 04/2018: EF to 50-55%, no RWMA, Gr1DD, mild to moderate aortic stenosis with mild aortic insufficiency, mildly dilated left atrium, RVSF normal, PASP mildly elevated  . Arthritis    lower back, right knee  . CAD S/P percutaneous coronary angioplasty 2003   a. remote PCI in 2003; b. Myoview 10/18 no ischemia, EF 39%  . HLD (hyperlipidemia)    a. intolerant to statins  . Hypertension   . Ischemic cardiomyopathy    MILD -- ECHO 04/2018: a. EF to 50-55%, no RWMA, Gr1DD, mild to moderate aortic stenosis with mild aortic insufficiency, mildly dilated left atrium, RVSF normal, PASP mildly elevated  . PAD (peripheral artery disease) (HCC)   . Sleep apnea    a. intolerant of CPAP    Surgical History: Past Surgical History:  Procedure Laterality Date  . CARDIAC CATHETERIZATION  2003   stent  . CARPAL TUNNEL RELEASE Right 10/24/2017   Procedure: CARPAL TUNNEL RELEASE ENDOSCOPIC;  Surgeon: Christena Flake, MD;  Location: [facility];  Service: Orthopedics;  Laterality: Right;  sleep apnea  . CERVICAL SPINE SURGERY Right 01/23/2017   arthrodesis, discectomy, osteophytectomy, decompression  C3-C4, [facility]  . COLONOSCOPY    . CORONARY STENT INTERVENTION N/A 07/08/2018   Procedure: CORONARY STENT INTERVENTION;  Surgeon: Iran Ouch, MD;  Location: [facility];   Service: Cardiovascular;  Laterality: N/A;  . KNEE SURGERY Right 1966  . LEFT HEART CATH AND CORONARY ANGIOGRAPHY N/A 07/08/2018   Procedure: LEFT HEART CATH AND CORONARY ANGIOGRAPHY;  Surgeon: Iran Ouch, MD;  Location: [facility];  Service: Cardiovascular;  Laterality: N/A;  . REPLACEMENT TOTAL KNEE Left 2005    Home Medications:  Allergies as of 02/20/2020      Reactions   Ampicillin Itching   Capsicum Swelling   Paprika and black pepper both cause swelling of the lips      Medication List       Accurate as of February 20, 2020  3:49 PM. If you have any questions, ask your nurse or doctor.        albuterol 108 (90 Base) MCG/ACT inhaler Commonly known as: VENTOLIN HFA Inhale into the lungs every 6 (six) hours as needed for wheezing or shortness of breath.   amLODipine 5 MG tablet Commonly known as: NORVASC Take 5 mg by mouth daily.   aspirin 81 MG tablet Take 81 mg by mouth daily.   cefUROXime 500 MG tablet Commonly known as: CEFTIN Take 1 tablet (500 mg total) by mouth 2 (two) times daily with a meal.   ezetimibe 10 MG tablet Commonly known as: ZETIA Take 1 tablet (10 mg total) by mouth daily.   fenofibrate 145 MG tablet Commonly known as: TRICOR Take 1 tablet (145 mg total) by mouth daily.   finasteride 5 MG tablet Commonly known as: PROSCAR Take 1 tablet (5  mg total) by mouth daily.   fluticasone 50 MCG/ACT nasal spray Commonly known as: FLONASE SPRAY TWO SPRAYS IN EACH NOSTRIL ONCE DAILY   isosorbide mononitrate 30 MG 24 hr tablet Commonly known as: IMDUR TAKE 1 TABLET BY MOUTH EVERY DAY   losartan 100 MG tablet Commonly known as: COZAAR Take 1 tablet (100 mg total) by mouth daily.   nitroGLYCERIN 0.4 MG SL tablet Commonly known as: NITROSTAT PLACE 1 TABLET UNDER THE TONGUE EVERY 5 (FIVE) MINUTES AS NEEDED FOR CHEST PAIN. MAXIMUM OF 3 DOSES.   tamsulosin 0.4 MG Caps capsule Commonly known as: FLOMAX Take 1 capsule by mouth  daily.       Allergies:  Allergies  Allergen Reactions  . Ampicillin Itching  . Capsicum Swelling    Paprika and black pepper both cause swelling of the lips    Family History: Family History  Problem Relation Age of Onset  . CAD Father   . Lung cancer Father   . Kidney cancer Father   . Diabetes Mother   . Alzheimer's disease Mother     Social History:  reports that he quit smoking about 33 years ago. He has never used smokeless tobacco. He reports current alcohol use of about 11.0 standard drinks of alcohol per week. He reports previous drug use.   Physical Exam: BP (!) 161/79   Pulse 80   Ht 5\' 11"  (1.803 m)   Wt 266 lb (120.7 kg)   BMI 37.10 kg/m   Constitutional:  Alert and oriented, No acute distress.  Imaging: A 8.5 MHz transrectal sound probe was lubricated and inserted per rectum.  The prostate was imaged in transverse and sagittal planes.  Dimensions were 7.35 x 6.01 x 7.08 cm.  Volume calculation 164 cc.   Assessment & Plan:   74 y.o. male with marked prostate enlargement, severe lower urinary tract symptoms, gross hematuria and PVR 115 mL.  He is interested in proceeding with outlet surgery.  Feel Holep is his best option.  He wanted to talk with one of my partners prior to scheduling the procedure and will schedule with Dr. Erlene Quan or Dr. Diamantina Providence.   Abbie Sons, [place] [address], [place], [place] [address] [phone]

## 2020-02-22 ENCOUNTER — Encounter: Payer: Self-pay | Admitting: Urology

## 2020-02-25 ENCOUNTER — Other Ambulatory Visit: Payer: Self-pay

## 2020-02-25 ENCOUNTER — Other Ambulatory Visit: Payer: Self-pay | Admitting: Urology

## 2020-02-25 ENCOUNTER — Ambulatory Visit: Payer: PPO | Admitting: Urology

## 2020-02-25 ENCOUNTER — Encounter: Payer: Self-pay | Admitting: Urology

## 2020-02-25 VITALS — BP 159/85 | HR 82 | Ht 74.0 in | Wt 258.0 lb

## 2020-02-25 DIAGNOSIS — R3914 Feeling of incomplete bladder emptying: Secondary | ICD-10-CM | POA: Diagnosis not present

## 2020-02-25 DIAGNOSIS — R310 Gross hematuria: Secondary | ICD-10-CM

## 2020-02-25 DIAGNOSIS — N401 Enlarged prostate with lower urinary tract symptoms: Secondary | ICD-10-CM

## 2020-02-25 DIAGNOSIS — N390 Urinary tract infection, site not specified: Secondary | ICD-10-CM

## 2020-02-25 LAB — MICROSCOPIC EXAMINATION: Bacteria, UA: NONE SEEN

## 2020-02-25 LAB — URINALYSIS, COMPLETE
Bilirubin, UA: NEGATIVE
Glucose, UA: NEGATIVE
Ketones, UA: NEGATIVE
Leukocytes,UA: NEGATIVE
Nitrite, UA: NEGATIVE
Protein,UA: NEGATIVE
Specific Gravity, UA: >1.03 — ABNORMAL HIGH (ref 1.005–1.030)
Urobilinogen, Ur: 0.2 mg/dL (ref 0.2–1.0)
pH, UA: 5.5 (ref 5.0–7.5)

## 2020-02-25 NOTE — Addendum Note (Signed)
Addended by: Frankey Shown on: 02/25/2020 10:56 AM   Modules accepted: Orders

## 2020-02-25 NOTE — Progress Notes (Signed)
   02/25/2020 9:35 AM   Kurt Kelley 05/06/1946 [id number]  Reason for visit: Discuss HOLEP  HPI: I saw Kurt Kelley in urology clinic today for discussion of HoLEP.  He is a 74 year old male that was previously followed by Dr. [name] and Michiel Cowboy in our clinic for BPH and urinary symptoms.  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  He was previously on Brilinta, however this was stopped after his episode of gross hematuria in the fall 2020.  He was originally seen in September 2020 in [place] with painless significant gross hematuria for about a week.  A CT with contrast showed no abnormalities and scrotal ultrasound was normal.    He underwent a hematuria work-up with Dr. [name] and cystoscopy showed a very enlarged prostate with obstructing lateral lobes and friable prostatic tissue, but no bladder lesions.  Cytotology was negative.  He was interested in outlet procedure secondary to his bothersome BPH symptoms of weak stream, incomplete emptying, and recurrent gross hematuria, and a TRUS showed a 164 g prostate.  He r recently presented with UTI symptoms and recurrence of gross hematuria and urine culture 02/04/2020 showed greater than 100 K Enterococcus and he was treated with culture appropriate antibiotics.  PSA in June 2020 was normal at 3.76.  Recent PSA at time of acute UTI was mildly elevated at 5.8.  We discussed the risks and benefits of HoLEP at length.  The procedure requires general anesthesia and takes 2 to 3 hours, and a holmium laser is used to enucleate the prostate and push this tissue into the bladder.  A morcellator is then used to remove this tissue, which is sent for pathology.  Majority of patients are able to discharge the same day with a catheter in place for 2 to 3 days, and will follow-up in clinic for a voiding trial.  Approximately 10%  of patients will be admitted overnight to monitor the urine, or if they have multiple co-morbidities.  We specifically discussed the risks of bleeding, infection, retrograde ejaculation, temporary urgency and urge incontinence, very low risk of long-term incontinence, pathologic evaluation of prostate tissue and possible detection of prostate cancer or other malignancy, and possible need for additional procedures.  I counseled him extensively that the etiology of his mildly elevated PSA was his acute UTI, and I have a low suspicion for prostate cancer with his very enlarged gland and normal PSA of 3.76 in June 2020.  Schedule HoLEP Needs cardiology clearance, okay to continue 81 mg aspirin through surgery  I spent 30 total minutes on the day of the encounter including pre-visit review of the medical record, face-to-face time with the patient, and post visit ordering of labs/imaging/tests.  Sondra Come, MD  [facility] [address], [address] [place], [place] [address] [phone]

## 2020-02-25 NOTE — Addendum Note (Signed)
Addended by: Janae Bridgeman on: 02/25/2020 03:19 PM   Modules accepted: Orders

## 2020-02-25 NOTE — Patient Instructions (Signed)

## 2020-02-29 LAB — CULTURE, URINE COMPREHENSIVE

## 2020-03-01 ENCOUNTER — Other Ambulatory Visit: Payer: PPO

## 2020-03-04 ENCOUNTER — Other Ambulatory Visit: Payer: Self-pay

## 2020-03-04 ENCOUNTER — Encounter
Admission: RE | Admit: 2020-03-04 | Discharge: 2020-03-04 | Disposition: A | Discharge status: Home / Self Care | Payer: PPO | Source: Ambulatory Visit | Attending: Urology | Admitting: Urology

## 2020-03-04 ENCOUNTER — Telehealth: Payer: Self-pay | Admitting: Cardiovascular Disease

## 2020-03-04 NOTE — Telephone Encounter (Signed)
Pt has hx of CAD s/p most recent Mi and stent in 06/2018. He is now off Brilinta due to blood in the urine. He has normal EF and moderate aortic stenosis by last echo in 05/2019. He has a low risk myocardial perfusion study in 10/2019.  I called pt to ensure that he is still stable since last office visit in 12/2019. Left VM for him to call back and ask for preop clinic.

## 2020-03-04 NOTE — Patient Instructions (Signed)
Your procedure is scheduled on: Friday 03/12/20 Report to [facility]. To find out your arrival time please call [phone] between 1PM - 3PM on Thursday 03/11/20.  Remember: Instructions that are not followed completely may result in serious medical risk, up to and including death, or upon the discretion of your surgeon and anesthesiologist your surgery may need to be rescheduled.     _X__ 1. Do not eat food after midnight the night before your procedure.                 No gum chewing or hard candies. You may drink clear liquids up to 2 hours                 before you are scheduled to arrive for your surgery- DO not drink clear                 liquids within 2 hours of the start of your surgery.                 Clear Liquids include:  water, apple juice without pulp, clear carbohydrate                 drink such as Clearfast or Gatorade, Black Coffee or Tea (Do not add                 anything to coffee or tea). Diabetics water only  __X__2.  On the morning of surgery brush your teeth with toothpaste and water, you                 may rinse your mouth with mouthwash if you wish.  Do not swallow any              toothpaste of mouthwash.     _X__ 3.  No Alcohol for 24 hours before or after surgery.   _X__ 4.  Do Not Smoke or use e-cigarettes For 24 Hours Prior to Your Surgery.                 Do not use any chewable tobacco products for at least 6 hours prior to                 surgery.  ____  5.  Bring all medications with you on the day of surgery if instructed.   __X__  6.  Notify your doctor if there is any change in your medical condition      (cold, fever, infections).     Do not wear jewelry, make-up, hairpins, clips or nail polish. Do not wear lotions, powders, or perfumes.  Do not shave 48 hours prior to surgery. Men may shave face and neck. Do not bring valuables to the hospital.    [facility] is not responsible for  any belongings or valuables.  Contacts, dentures/partials or body piercings may not be worn into surgery. Bring a case for your contacts, glasses or hearing aids, a denture cup will be supplied. Leave your suitcase in the car. After surgery it may be brought to your room. For patients admitted to the hospital, discharge time is determined by your treatment team.   Patients discharged the day of surgery will not be allowed to drive home.   Please read over the following fact sheets that you were given:   MRSA Information  __X__ Take these medicines the morning of surgery with A SIP OF WATER:  1. ezetimibe (ZETIA) 10 MG tablet  2. fenofibrate (TRICOR) 145 MG tablet  3. finasteride (PROSCAR) 5 MG tablet  4.  5.  6.  ____ Fleet Enema (as directed)   __X__ Use CHG Soap/SAGE wipes as directed  ____ Use inhalers on the day of surgery  ____ Stop metformin/Janumet/Farxiga 2 days prior to surgery    ____ Take 1/2 of usual insulin dose the night before surgery. No insulin the morning          of surgery.   ____ Stop Blood Thinners Coumadin/Plavix/Xarelto/Pleta/Pradaxa/Eliquis/Effient/Aspirin  on   Or contact your Surgeon, Cardiologist or Medical Doctor regarding  ability to stop your blood thinners  __X__ Stop Anti-inflammatories 7 days before surgery such as Advil, Ibuprofen, Motrin,  BC or Goodies Powder, Naprosyn, Naproxen, Aleve   __X__ Stop all herbal supplements, fish oil or vitamin E until after surgery.    ____ Bring C-Pap to the hospital.   Continue aspirin. Hold bedtime dose on Thurs 03/11/20.     Indwelling Urinary Catheter Care, Adult   An indwelling urinary catheter is a thin, flexible, germ-free (sterile) tube that is placed into the bladder to help drain urine out of the body. The catheter is inserted into the part of the body that drains urine from the bladder (urethra). Urine drains from the catheter into a drainage bag outside of the body. Taking good care of  your catheter will keep it working properly and help to prevent problems from developing. What are the risks?  Bacteria may get into your bladder and cause a urinary tract infection.  Urine flow can become blocked. This can happen if the catheter is not working correctly, or if you have sediment or a blood clot in your bladder or the catheter.  Tissue near the catheter may become irritated and bleed. How to wear your catheter and your drainage bag Supplies needed  Adhesive tape or a leg strap.  Alcohol wipe or soap and water (if you use tape).  A clean towel (if you use tape).  Overnight drainage bag.  Smaller drainage bag (leg bag). Wearing your catheter and bag Use adhesive tape or a leg strap to attach your catheter to your leg.  Make sure the catheter is not pulled tight.  If a leg strap gets wet, replace it with a dry one.  If you use adhesive tape: 1. Use an alcohol wipe or soap and water to wash off any stickiness on your skin where you had tape before. 2. Use a clean towel to pat-dry the area. 3. Apply the new tape. You should have received a large overnight drainage bag and a smaller leg bag that fits underneath clothing.  You may wear the overnight bag at any time, but you should not wear the leg bag at night.  Always wear the leg bag below your knee.  Make sure the overnight drainage bag is always lower than the level of your bladder, but do not let it touch the floor. Before you go to sleep, hang the bag inside a wastebasket that is covered by a clean plastic bag. How to care for your skin around the catheter     Supplies needed  A clean washcloth.  Water and mild soap.  A clean towel. Caring for your skin and catheter  Every day, use a clean washcloth and soapy water to clean the skin around your catheter. 1. Wash your hands with soap and water. 2. Wet a washcloth in warm water and  mild soap. 3. Clean the skin around your urethra.  If you are  male:  Use one hand to gently spread the folds of skin around your vagina (labia).  With the washcloth in your other hand, wipe the inner side of your labia on each side. Do this in a front-to-back direction.  If you are male:  Use one hand to pull back any skin that covers the end of your penis (foreskin).  With the washcloth in your other hand, wipe your penis in small circles. Start wiping at the tip of your penis, then move outward from the catheter.  Move the foreskin back in place, if this applies. 4. With your free hand, hold the catheter close to where it enters your body. Keep holding the catheter during cleaning so it does not get pulled out. 5. Use your other hand to clean the catheter with the washcloth.  Only wipe downward on the catheter.  Do not wipe upward toward your body, because that may push bacteria into your urethra and cause infection. 6. Use a clean towel to pat-dry the catheter and the skin around it. Make sure to wipe off all soap. 7. Wash your hands with soap and water.  Shower every day. Do not take baths.  Do not use cream, ointment, or lotion on the area where the catheter enters your body, unless your health care provider tells you to do that.  Do not use powders, sprays, or lotions on your genital area.  Check your skin around the catheter every day for signs of infection. Check for: ? Redness, swelling, or pain. ? Fluid or blood. ? Warmth. ? Pus or a bad smell. How to empty the drainage bag Supplies needed  Rubbing alcohol.  Gauze pad or cotton ball.  Adhesive tape or a leg strap. Emptying the bag Empty your drainage bag (your overnight drainage bag or your leg bag) when it is ?- full, or at least 2-3 times a day. Clean the drainage bag according to the manufacturer's instructions or as told by your health care provider. 1. Wash your hands with soap and water. 2. Detach the drainage bag from your leg. 3. Hold the drainage bag over the  toilet or a clean container. Make sure the drainage bag is lower than your hips and bladder. This stops urine from going back into the tubing and into your bladder. 4. Open the pour spout at the bottom of the bag. 5. Empty the urine into the toilet or container. Do not let the pour spout touch any surface. This precaution is important to prevent bacteria from getting in the bag and causing infection. 6. Apply rubbing alcohol to a gauze pad or cotton ball. 7. Use the gauze pad or cotton ball to clean the pour spout. 8. Close the pour spout. 9. Attach the bag to your leg with adhesive tape or a leg strap. 10. Wash your hands with soap and water. How to change the drainage bag Supplies needed:  Alcohol wipes.  A clean drainage bag.  Adhesive tape or a leg strap. Changing the bag Replace your drainage bag with a clean bag if it leaks, starts to smell bad, or looks dirty. 1. Wash your hands with soap and water. 2. Detach the dirty drainage bag from your leg. 3. Pinch the catheter with your fingers so that urine does not spill out. 4. Disconnect the catheter tube from the drainage tube at the connection valve. Do not let the tubes touch any  surface. 5. Clean the end of the catheter tube with an alcohol wipe. Use a different alcohol wipe to clean the end of the drainage tube. 6. Connect the catheter tube to the drainage tube of the clean bag. 7. Attach the clean bag to your leg with adhesive tape or a leg strap. Avoid attaching the new bag too tightly. 8. Wash your hands with soap and water. General instructions   Never pull on your catheter or try to remove it. Pulling can damage your internal tissues.  Always wash your hands before and after you handle your catheter or drainage bag. Use a mild, fragrance-free soap. If soap and water are not available, use hand sanitizer.  Always make sure there are no twists or bends (kinks) in the catheter tube.  Always make sure there are no leaks in  the catheter or drainage bag.  Drink enough fluid to keep your urine pale yellow.  Do not take baths, swim, or use a hot tub.  If you are male, wipe from front to back after having a bowel movement. Contact a health care provider if:  Your urine is cloudy.  Your urine smells unusually bad.  Your catheter gets clogged.  Your catheter starts to leak.  Your bladder feels full. Get help right away if:  You have redness, swelling, or pain where the catheter enters your body.  You have fluid, blood, pus, or a bad smell coming from the area where the catheter enters your body.  The area where the catheter enters your body feels warm to the touch.  You have a fever.  You have pain in your abdomen, legs, lower back, or bladder.  You see blood in the catheter.  Your urine is pink or red.  You have nausea, vomiting, or chills.  Your urine is not draining into the bag.  Your catheter gets pulled out. Summary  An indwelling urinary catheter is a thin, flexible, germ-free (sterile) tube that is placed into the bladder to help drain urine out of the body.  The catheter is inserted into the part of the body that drains urine from the bladder (urethra).  Take good care of your catheter to keep it working properly and help prevent problems from developing.  Always wash your hands before and after you handle your catheter or drainage bag.  Never pull on your catheter or try to remove it. This information is not intended to replace advice given to you by your health care provider. Make sure you discuss any questions you have with your health care provider. Document Revised: 04/04/2019 Document Reviewed: 07/27/2017 Elsevier Patient Education  2020 [employer].

## 2020-03-04 NOTE — Telephone Encounter (Signed)
   [facility] Pre-operative Risk Assessment    Request for surgical clearance:  1. What type of surgery is being performed? Holmium Laser Enucleation of Prostate  2. When is this surgery scheduled? 03/12/20  3. What type of clearance is required (medical clearance vs. Pharmacy clearance to hold med vs. Both)? both  4. Are there any medications that need to be held prior to surgery and how long? Not stated, however Dr. Diamantina Providence states pt may continue ASA 81 mg  5. Practice name and name of physician performing surgery? [facility]  6. What is your office phone number [phone]   7.   What is your office fax number [phone]  8.   Anesthesia type (None, local, MAC, general) ? general   Kurt Kelley 03/04/2020, 3:43 PM  _________________________________________________________________   (provider comments below)

## 2020-03-05 ENCOUNTER — Other Ambulatory Visit: Payer: Self-pay | Admitting: Cardiovascular Disease

## 2020-03-08 ENCOUNTER — Other Ambulatory Visit: Payer: Self-pay | Admitting: Cardiovascular Disease

## 2020-03-10 ENCOUNTER — Other Ambulatory Visit
Admission: RE | Admit: 2020-03-10 | Discharge: 2020-03-10 | Disposition: A | Discharge status: Home / Self Care | Payer: PPO | Source: Ambulatory Visit | Attending: Urology | Admitting: Urology

## 2020-03-10 ENCOUNTER — Other Ambulatory Visit: Payer: Self-pay

## 2020-03-10 DIAGNOSIS — Z20822 Contact with and (suspected) exposure to covid-19: Secondary | ICD-10-CM | POA: Insufficient documentation

## 2020-03-10 DIAGNOSIS — Z01812 Encounter for preprocedural laboratory examination: Secondary | ICD-10-CM | POA: Insufficient documentation

## 2020-03-10 LAB — SARS CORONAVIRUS 2 (TAT 6-24 HRS): SARS Coronavirus 2: NEGATIVE

## 2020-03-11 MED ORDER — CIPROFLOXACIN IN D5W 400 MG/200ML IV SOLN
400.0000 mg | INTRAVENOUS | Status: AC
  Administered 2020-03-12: 08:00:00 400 mg via INTRAVENOUS

## 2020-03-11 MED ORDER — VANCOMYCIN HCL IN DEXTROSE 1-5 GM/200ML-% IV SOLN: 1000.0000 mg | INTRAVENOUS | Status: AC

## 2020-03-11 NOTE — Telephone Encounter (Signed)
   Primary Cardiologist: Lorine Bears, MD  Chart reviewed as part of pre-operative protocol coverage. Given past medical history and time since last visit, based on ACC/AHA guidelines, Kurt Kelley would be at acceptable risk for the planned procedure without further cardiovascular testing.   I will route this recommendation to the requesting party via Epic fax function and remove from pre-op pool.  Please call with questions.  [place], [place] 03/11/2020, 9:16 AM

## 2020-03-12 ENCOUNTER — Other Ambulatory Visit: Payer: Self-pay

## 2020-03-12 ENCOUNTER — Encounter: Payer: Self-pay | Admitting: Urology

## 2020-03-12 ENCOUNTER — Encounter
Admission: RE | Disposition: A | Discharge status: Home / Self Care | Payer: Self-pay | Source: Home / Self Care | Attending: Urology

## 2020-03-12 ENCOUNTER — Ambulatory Visit: Payer: PPO | Admitting: Certified Registered"

## 2020-03-12 ENCOUNTER — Ambulatory Visit
Admission: RE | Admit: 2020-03-12 | Discharge: 2020-03-12 | Disposition: A | Discharge status: Home / Self Care | Payer: PPO | Attending: Urology | Admitting: Urology

## 2020-03-12 DIAGNOSIS — I739 Peripheral vascular disease, unspecified: Secondary | ICD-10-CM | POA: Insufficient documentation

## 2020-03-12 DIAGNOSIS — J449 Chronic obstructive pulmonary disease, unspecified: Secondary | ICD-10-CM | POA: Insufficient documentation

## 2020-03-12 DIAGNOSIS — G4730 Sleep apnea, unspecified: Secondary | ICD-10-CM | POA: Insufficient documentation

## 2020-03-12 DIAGNOSIS — Z87891 Personal history of nicotine dependence: Secondary | ICD-10-CM | POA: Insufficient documentation

## 2020-03-12 DIAGNOSIS — I255 Ischemic cardiomyopathy: Secondary | ICD-10-CM | POA: Diagnosis not present

## 2020-03-12 DIAGNOSIS — I10 Essential (primary) hypertension: Secondary | ICD-10-CM | POA: Insufficient documentation

## 2020-03-12 DIAGNOSIS — Z955 Presence of coronary angioplasty implant and graft: Secondary | ICD-10-CM | POA: Insufficient documentation

## 2020-03-12 DIAGNOSIS — E785 Hyperlipidemia, unspecified: Secondary | ICD-10-CM | POA: Diagnosis not present

## 2020-03-12 DIAGNOSIS — N401 Enlarged prostate with lower urinary tract symptoms: Secondary | ICD-10-CM

## 2020-03-12 DIAGNOSIS — R338 Other retention of urine: Secondary | ICD-10-CM | POA: Diagnosis not present

## 2020-03-12 DIAGNOSIS — I358 Other nonrheumatic aortic valve disorders: Secondary | ICD-10-CM | POA: Diagnosis not present

## 2020-03-12 DIAGNOSIS — I2510 Atherosclerotic heart disease of native coronary artery without angina pectoris: Secondary | ICD-10-CM | POA: Diagnosis not present

## 2020-03-12 DIAGNOSIS — I350 Nonrheumatic aortic (valve) stenosis: Secondary | ICD-10-CM | POA: Diagnosis not present

## 2020-03-12 DIAGNOSIS — E782 Mixed hyperlipidemia: Secondary | ICD-10-CM | POA: Diagnosis not present

## 2020-03-12 DIAGNOSIS — I351 Nonrheumatic aortic (valve) insufficiency: Secondary | ICD-10-CM | POA: Diagnosis not present

## 2020-03-12 DIAGNOSIS — R3914 Feeling of incomplete bladder emptying: Secondary | ICD-10-CM | POA: Diagnosis not present

## 2020-03-12 DIAGNOSIS — I252 Old myocardial infarction: Secondary | ICD-10-CM | POA: Diagnosis not present

## 2020-03-12 DIAGNOSIS — Z87440 Personal history of urinary (tract) infections: Secondary | ICD-10-CM | POA: Diagnosis not present

## 2020-03-12 DIAGNOSIS — K219 Gastro-esophageal reflux disease without esophagitis: Secondary | ICD-10-CM | POA: Diagnosis not present

## 2020-03-12 DIAGNOSIS — Z96653 Presence of artificial knee joint, bilateral: Secondary | ICD-10-CM | POA: Diagnosis not present

## 2020-03-12 DIAGNOSIS — G4733 Obstructive sleep apnea (adult) (pediatric): Secondary | ICD-10-CM | POA: Diagnosis not present

## 2020-03-12 DIAGNOSIS — Z951 Presence of aortocoronary bypass graft: Secondary | ICD-10-CM | POA: Diagnosis not present

## 2020-03-12 DIAGNOSIS — R310 Gross hematuria: Secondary | ICD-10-CM | POA: Insufficient documentation

## 2020-03-12 DIAGNOSIS — R3912 Poor urinary stream: Secondary | ICD-10-CM | POA: Diagnosis not present

## 2020-03-12 HISTORY — PX: HOLEP-LASER ENUCLEATION OF THE PROSTATE WITH MORCELLATION: SHX6641

## 2020-03-12 SURGERY — ENUCLEATION, PROSTATE, USING LASER, WITH MORCELLATION
Anesthesia: General

## 2020-03-12 MED ORDER — ONDANSETRON HCL 4 MG/2ML IJ SOLN
INTRAMUSCULAR | Status: AC
  Filled 2020-03-12: qty 2

## 2020-03-12 MED ORDER — DEXAMETHASONE SODIUM PHOSPHATE 10 MG/ML IJ SOLN
INTRAMUSCULAR | Status: DC | PRN
  Administered 2020-03-12: 4 mg via INTRAVENOUS

## 2020-03-12 MED ORDER — FENTANYL CITRATE (PF) 100 MCG/2ML IJ SOLN
INTRAMUSCULAR | Status: DC | PRN
  Administered 2020-03-12: 50 ug via INTRAVENOUS

## 2020-03-12 MED ORDER — PROMETHAZINE HCL 25 MG/ML IJ SOLN: 6.2500 mg | INTRAMUSCULAR | Status: DC | PRN

## 2020-03-12 MED ORDER — FENTANYL CITRATE (PF) 100 MCG/2ML IJ SOLN: 25.0000 ug | INTRAMUSCULAR | Status: DC | PRN

## 2020-03-12 MED ORDER — BELLADONNA ALKALOIDS-OPIUM 16.2-60 MG RE SUPP
RECTAL | Status: AC
  Filled 2020-03-12: qty 1

## 2020-03-12 MED ORDER — BELLADONNA ALKALOIDS-OPIUM 16.2-60 MG RE SUPP
RECTAL | Status: DC | PRN
  Administered 2020-03-12: 1 via RECTAL

## 2020-03-12 MED ORDER — EPHEDRINE SULFATE 50 MG/ML IJ SOLN
INTRAMUSCULAR | Status: DC | PRN
  Administered 2020-03-12: 10 mg via INTRAVENOUS
  Administered 2020-03-12: 5 mg via INTRAVENOUS

## 2020-03-12 MED ORDER — ROCURONIUM BROMIDE 10 MG/ML (PF) SYRINGE
PREFILLED_SYRINGE | INTRAVENOUS | Status: AC
  Filled 2020-03-12: qty 20

## 2020-03-12 MED ORDER — FENTANYL CITRATE (PF) 100 MCG/2ML IJ SOLN
INTRAMUSCULAR | Status: AC
  Filled 2020-03-12: qty 2

## 2020-03-12 MED ORDER — FAMOTIDINE 20 MG PO TABS: 20.0000 mg | ORAL_TABLET | Freq: Once | ORAL | Status: AC

## 2020-03-12 MED ORDER — ONDANSETRON HCL 4 MG/2ML IJ SOLN
INTRAMUSCULAR | Status: DC | PRN
  Administered 2020-03-12: 4 mg via INTRAVENOUS

## 2020-03-12 MED ORDER — EPHEDRINE 5 MG/ML INJ
INTRAVENOUS | Status: AC
  Filled 2020-03-12: qty 10

## 2020-03-12 MED ORDER — OXYCODONE HCL 5 MG PO TABS: 5.0000 mg | ORAL_TABLET | Freq: Once | ORAL | Status: DC | PRN

## 2020-03-12 MED ORDER — VANCOMYCIN HCL IN DEXTROSE 1-5 GM/200ML-% IV SOLN
INTRAVENOUS | Status: AC
  Administered 2020-03-12: 1000 mg via INTRAVENOUS
  Filled 2020-03-12: qty 200

## 2020-03-12 MED ORDER — PHENYLEPHRINE HCL (PRESSORS) 10 MG/ML IV SOLN
INTRAVENOUS | Status: DC | PRN
  Administered 2020-03-12: 100 ug via INTRAVENOUS
  Administered 2020-03-12: 50 ug via INTRAVENOUS
  Administered 2020-03-12 (×4): 100 ug via INTRAVENOUS
  Administered 2020-03-12: 50 ug via INTRAVENOUS
  Administered 2020-03-12 (×9): 100 ug via INTRAVENOUS
  Administered 2020-03-12: 50 ug via INTRAVENOUS
  Administered 2020-03-12 (×2): 100 ug via INTRAVENOUS
  Administered 2020-03-12: 50 ug via INTRAVENOUS
  Administered 2020-03-12 (×4): 100 ug via INTRAVENOUS
  Administered 2020-03-12 (×2): 50 ug via INTRAVENOUS
  Administered 2020-03-12 (×3): 100 ug via INTRAVENOUS

## 2020-03-12 MED ORDER — OXYCODONE HCL 5 MG/5ML PO SOLN: 5.0000 mg | Freq: Once | ORAL | Status: DC | PRN

## 2020-03-12 MED ORDER — MIDAZOLAM HCL 5 MG/5ML IJ SOLN
INTRAMUSCULAR | Status: DC | PRN
  Administered 2020-03-12 (×2): 1 mg via INTRAVENOUS

## 2020-03-12 MED ORDER — MIDAZOLAM HCL 2 MG/2ML IJ SOLN
INTRAMUSCULAR | Status: AC
  Filled 2020-03-12: qty 2

## 2020-03-12 MED ORDER — SUGAMMADEX SODIUM 500 MG/5ML IV SOLN
INTRAVENOUS | Status: AC
  Filled 2020-03-12: qty 5

## 2020-03-12 MED ORDER — FAMOTIDINE 20 MG PO TABS
ORAL_TABLET | ORAL | Status: AC
  Administered 2020-03-12: 20 mg via ORAL
  Filled 2020-03-12: qty 1

## 2020-03-12 MED ORDER — HYDROCODONE-ACETAMINOPHEN 5-325 MG PO TABS: 1.0000 | ORAL_TABLET | ORAL | 0 refills | Status: AC | PRN

## 2020-03-12 MED ORDER — PROPOFOL 10 MG/ML IV BOLUS
INTRAVENOUS | Status: DC | PRN
  Administered 2020-03-12: 70 mg via INTRAVENOUS
  Administered 2020-03-12: 100 mg via INTRAVENOUS

## 2020-03-12 MED ORDER — LIDOCAINE HCL (PF) 2 % IJ SOLN
INTRAMUSCULAR | Status: DC | PRN
  Administered 2020-03-12: 60 mg via INTRADERMAL

## 2020-03-12 MED ORDER — SUGAMMADEX SODIUM 200 MG/2ML IV SOLN
INTRAVENOUS | Status: DC | PRN
  Administered 2020-03-12: 250 mg via INTRAVENOUS

## 2020-03-12 MED ORDER — CIPROFLOXACIN IN D5W 400 MG/200ML IV SOLN
INTRAVENOUS | Status: AC
  Filled 2020-03-12: qty 200

## 2020-03-12 MED ORDER — LIDOCAINE HCL (PF) 2 % IJ SOLN
INTRAMUSCULAR | Status: AC
  Filled 2020-03-12: qty 10

## 2020-03-12 MED ORDER — VASOPRESSIN 20 UNIT/ML IV SOLN
INTRAVENOUS | Status: DC | PRN
  Administered 2020-03-12: 2 [IU] via INTRAVENOUS
  Administered 2020-03-12 (×3): 1 [IU] via INTRAVENOUS

## 2020-03-12 MED ORDER — LACTATED RINGERS IV SOLN
INTRAVENOUS | Status: DC
  Administered 2020-03-12: 125 mL/h via INTRAVENOUS

## 2020-03-12 MED ORDER — PROPOFOL 10 MG/ML IV BOLUS
INTRAVENOUS | Status: AC
  Filled 2020-03-12: qty 40

## 2020-03-12 MED ORDER — DIPHENHYDRAMINE HCL 50 MG/ML IJ SOLN
INTRAMUSCULAR | Status: AC
  Filled 2020-03-12: qty 1

## 2020-03-12 MED ORDER — ROCURONIUM BROMIDE 100 MG/10ML IV SOLN
INTRAVENOUS | Status: DC | PRN
  Administered 2020-03-12: 10 mg via INTRAVENOUS
  Administered 2020-03-12: 60 mg via INTRAVENOUS
  Administered 2020-03-12: 20 mg via INTRAVENOUS
  Administered 2020-03-12 (×3): 10 mg via INTRAVENOUS

## 2020-03-12 MED ORDER — DEXAMETHASONE SODIUM PHOSPHATE 10 MG/ML IJ SOLN
INTRAMUSCULAR | Status: AC
  Filled 2020-03-12: qty 1

## 2020-03-12 MED ORDER — PHENYLEPHRINE HCL-NACL 10-0.9 MG/250ML-% IV SOLN
INTRAVENOUS | Status: DC | PRN
  Administered 2020-03-12: 50 ug/min via INTRAVENOUS

## 2020-03-12 MED ORDER — VASOPRESSIN 20 UNIT/ML IV SOLN
INTRAVENOUS | Status: AC
  Filled 2020-03-12: qty 1

## 2020-03-12 SURGICAL SUPPLY — 35 items
ADAPTER IRRIG TUBE 2 SPIKE SOL (ADAPTER) ×6 IMPLANT
ADPR TBG 2 SPK PMP STRL ASCP (ADAPTER) ×2
BAG DRN LRG CPC RND TRDRP CNTR (MISCELLANEOUS) ×1
BAG URO DRAIN 4000ML (MISCELLANEOUS) ×3 IMPLANT
CATH FOLEY 3WAY 30CC 24FR (CATHETERS) ×3
CATH URETL 5X70 OPEN END (CATHETERS) ×3 IMPLANT
CATH URTH STD 24FR FL 3W 2 (CATHETERS) ×1 IMPLANT
CONTAINER COLLECT MORCELLATR (MISCELLANEOUS) ×1 IMPLANT
DRAPE UTILITY 15X26 TOWEL STRL (DRAPES) IMPLANT
ELECT BIVAP BIPO 22/24 DONUT (ELECTROSURGICAL)
ELECTRD BIVAP BIPO 22/24 DONUT (ELECTROSURGICAL) IMPLANT
FILTER OVERFLOW MORCELLATOR (FILTER) ×1 IMPLANT
GLOVE BIOGEL PI IND STRL 7.5 (GLOVE) ×1 IMPLANT
GLOVE BIOGEL PI INDICATOR 7.5 (GLOVE) ×2
GOWN STRL REUS W/ TWL LRG LVL3 (GOWN DISPOSABLE) ×1 IMPLANT
GOWN STRL REUS W/ TWL XL LVL3 (GOWN DISPOSABLE) ×1 IMPLANT
GOWN STRL REUS W/TWL LRG LVL3 (GOWN DISPOSABLE) ×3
GOWN STRL REUS W/TWL XL LVL3 (GOWN DISPOSABLE) ×3
HOLDER FOLEY CATH W/STRAP (MISCELLANEOUS) ×3 IMPLANT
KIT TURNOVER CYSTO (KITS) ×3 IMPLANT
LASER FIBER 550M SMARTSCOPE (Laser) ×3 IMPLANT
MORCELLATOR COLLECT CONTAINER (MISCELLANEOUS) ×3
MORCELLATOR OVERFLOW FILTER (FILTER) ×3
MORCELLATOR ROTATION 4.75 335 (MISCELLANEOUS) ×3 IMPLANT
PACK CYSTO AR (MISCELLANEOUS) ×3 IMPLANT
SET CYSTO W/LG BORE CLAMP LF (SET/KITS/TRAYS/PACK) ×3 IMPLANT
SET IRRIG Y TYPE TUR BLADDER L (SET/KITS/TRAYS/PACK) ×3 IMPLANT
SLEEVE PROTECTION STRL DISP (MISCELLANEOUS) ×6 IMPLANT
SOL .9 NS 3000ML IRR  AL (IV SOLUTION) ×60
SOL .9 NS 3000ML IRR AL (IV SOLUTION) ×20
SOL .9 NS 3000ML IRR UROMATIC (IV SOLUTION) ×4 IMPLANT
SURGILUBE 2OZ TUBE FLIPTOP (MISCELLANEOUS) ×3 IMPLANT
SYRINGE IRR TOOMEY STRL 70CC (SYRINGE) ×3 IMPLANT
TUBE PUMP MORCELLATOR PIRANHA (TUBING) ×3 IMPLANT
WATER STERILE IRR 1000ML POUR (IV SOLUTION) ×3 IMPLANT

## 2020-03-12 NOTE — H&P (Signed)
UROLOGY H&P UPDATE  Agree with prior H&P dated 02/20/20.   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  He was previously on Brilinta, however this was stopped after his episode of severe gross hematuria in the fall 2020.  He was originally seen in September 2020 in [place] with painless significant gross hematuria for about a week.  A CT with contrast showed no abnormalities and scrotal ultrasound was normal.    He underwent a hematuria work-up with Dr. [name] and cystoscopy showed a very enlarged prostate with obstructing lateral lobes and friable prostatic tissue, but no bladder lesions.  Cytotology was negative.  He is interested in outlet procedure secondary to his bothersome BPH symptoms of weak stream, incomplete emptying, and recurrent gross hematuria, and a TRUS showed a 164 g prostate.  He recently presented with UTI symptoms and recurrence of gross hematuria and urine culture 02/04/2020 showed greater than 100 K Enterococcus and he was treated with culture appropriate antibiotics.  PSA in June 2020 was normal at 3.76.  Recent PSA at time of acute UTI was mildly elevated at 5.8.  Cardiac: RRR Lungs: CTA bilaterally  Laterality: N/A Procedure: HoLEP  Urine: Culture 3/3: 1k mixed urogenital flora  We discussed the risks and benefits of HoLEP at length.  The procedure requires general anesthesia and takes 2 to 3 hours, and a holmium laser is used to enucleate the prostate and push this tissue into the bladder.  A morcellator is then used to remove this tissue, which is sent for pathology.  The vast majority of patients are able to discharge the same day with a catheter in place for 2 to 3 days, and will follow-up in clinic for a voiding trial.  Approximately 5% of patients will be admitted overnight to monitor the urine, or if they have multiple co-morbidities.  We  specifically discussed the risks of bleeding, infection, retrograde ejaculation, temporary urgency and urge incontinence, very low risk of long-term incontinence, pathologic evaluation of prostate tissue and possible detection of prostate cancer or other malignancy, and possible need for additional procedures.  Sondra Come, MD 03/12/2020

## 2020-03-12 NOTE — Op Note (Signed)
Date of procedure: 03/12/20  Preoperative diagnosis:  1. 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 Same  Procedure: 1. HoLEP (Holmium Laser Enucleation of the Prostate)  Surgeon: Legrand Rams, MD  Anesthesia: General  Complications: None  Intraoperative findings:  1.  Large prostate with large median and lateral lobes 2.  Moderate bladder trabeculations, ureteral orifices orthotopic bilaterally, no bladder lesions 3.  Verumontanum and ureteral orifices intact at conclusion of case  EBL: Minimal  Specimens: Prostate chips for permanent  Enucleation time: 60 minutes  Morcellation time: 24 minutes  Intra-op weight: 112 g  Drains: 24 French three-way Foley, 60 cc in balloon  Indication: Kurt Kelley is a 74 y.o. patient with large 160 g prostate on rectal ultrasound and BPH symptoms of weak stream and incomplete emptying, as well as recurrent gross hematuria and urinary tract infections.  After reviewing the management options for treatment, they elected to proceed with the above surgical procedure(s). We have discussed the potential benefits and risks of the procedure, side effects of the proposed treatment, the likelihood of the patient achieving the goals of the procedure, and any potential problems that might occur during the procedure or recuperation.  We specifically discussed the risks of bleeding, infection, hematuria and clot retention, need for additional procedures, possible overnight hospital stay, temporary urgency and incontinence, rare long-term incontinence, and retrograde ejaculation.  Informed consent has been obtained.   Description of procedure:  The patient was taken to the operating room and general anesthesia was induced.  The patient was placed in the dorsal lithotomy position, prepped and draped in the usual sterile fashion, and preoperative antibiotics(Cipro and vancomycin) were  administered.  SCDs were placed for DVT prophylaxis.  A preoperative time-out was performed.   The 20 French continuous flow resectoscope was inserted into the urethra using the visual obturator  The prostate was very large with a high bladder neck and obstructive median and lateral lobes. The bladder was thoroughly inspected and notable for moderate bladder trabeculations.  The ureteral orifices were located in orthotopic position.  The laser was set to 2 J and 50 Hz and was used to make an incision at the 5 and 7 o'clock position to the level of the capsule from the bladder neck to the verumontanum.  The median lobe was enucleated into the bladder.  The lateral lobes were then incised circumferentially, starting on the right side, until they were disconnected from the surrounding tissue.  The capsule was examined and laser was used for meticulous hemostasis.    The 34 French resectoscope was then switched out for the 26 French nephroscope and the lobes were morcellated and the tissue sent to pathology.  A 24 French three-way catheter was inserted easily, and CBI was initiated.  60 cc were placed in the balloon.  Urine was clear.  The catheter irrigated easily with a Toomey syringe.  A belladonna suppository was placed.  The patient tolerated the procedure well without any immediate complications and was extubated and transferred to the recovery room in stable condition.  Urine was clear on fast CBI.  Disposition: Stable to PACU  Plan: Wean CBI in PACU, anticipate discharge home today with void trial in clinic in 2-3 days  Legrand Rams, MD 03/12/2020

## 2020-03-12 NOTE — Anesthesia Procedure Notes (Signed)
Procedure Name: Intubation Date/Time: 03/12/2020 7:43 AM Performed by: Adalberto Ill, CRNA Pre-anesthesia Checklist: Patient identified, Emergency Drugs available, Suction available, Patient being monitored and Timeout performed Patient Re-evaluated:Patient Re-evaluated prior to induction Oxygen Delivery Method: Circle system utilized Preoxygenation: Pre-oxygenation with 100% oxygen Induction Type: IV induction Ventilation: Mask ventilation without difficulty Laryngoscope Size: Mac, McGraph and 3 Grade View: Grade I Tube type: Oral Tube size: 8.0 mm Number of attempts: 1 Airway Equipment and Method: Stylet and Video-laryngoscopy Placement Confirmation: ETT inserted through vocal cords under direct vision,  positive ETCO2 and breath sounds checked- equal and bilateral Secured at: 24 cm Dental Injury: Teeth and Oropharynx as per pre-operative assessment

## 2020-03-12 NOTE — Discharge Instructions (Addendum)
 Indwelling Urinary Catheter Care, Adult An indwelling urinary catheter is a thin tube that is put into your bladder. The tube helps to drain pee (urine) out of your body. The tube goes in through your urethra. Your urethra is where pee comes out of your body. Your pee will come out through the catheter, then it will go into a bag (drainage bag). Take good care of your catheter so it will work well. How to wear your catheter and bag Supplies needed  Sticky tape (adhesive tape) or a leg strap.  Alcohol wipe or soap and water (if you use tape).  A clean towel (if you use tape).  Large overnight bag.  Smaller bag (leg bag). Wearing your catheter Attach your catheter to your leg with tape or a leg strap.  Make sure the catheter is not pulled tight.  If a leg strap gets wet, take it off and put on a dry strap.  If you use tape to hold the bag on your leg: 1. Use an alcohol wipe or soap and water to wash your skin where the tape made it sticky before. 2. Use a clean towel to pat-dry that skin. 3. Use new tape to make the bag stay on your leg. Wearing your bags You should have been given a large overnight bag.  You may wear the overnight bag in the day or night.  Always have the overnight bag lower than your bladder.  Do not let the bag touch the floor.  Before you go to sleep, put a clean plastic bag in a wastebasket. Then hang the overnight bag inside the wastebasket. You should also have a smaller leg bag that fits under your clothes.  Always wear the leg bag below your knee.  Do not wear your leg bag at night. How to care for your skin and catheter Supplies needed  A clean washcloth.  Water and mild soap.  A clean towel. Caring for your skin and catheter      Clean the skin around your catheter every day: 1. Wash your hands with soap and water. 2. Wet a clean washcloth in warm water and mild soap. 3. Clean the skin around your urethra.  If you are  male:  Gently spread the folds of skin around your vagina (labia).  With the washcloth in your other hand, wipe the inner side of your labia on each side. Wipe from front to back.  If you are male:  Pull back any skin that covers the end of your penis (foreskin).  With the washcloth in your other hand, wipe your penis in small circles. Start wiping at the tip of your penis, then move away from the catheter.  Move the foreskin back in place, if needed. 4. With your free hand, hold the catheter close to where it goes into your body.  Keep holding the catheter during cleaning so it does not get pulled out. 5. With the washcloth in your other hand, clean the catheter.  Only wipe downward on the catheter.  Do not wipe upward toward your body. Doing this may push germs into your urethra and cause infection. 6. Use a clean towel to pat-dry the catheter and the skin around it. Make sure to wipe off all soap. 7. Wash your hands with soap and water.  Shower every day. Do not take baths.  Do not use cream, ointment, or lotion on the area where the catheter goes into your body, unless your doctor tells   you to.  Do not use powders, sprays, or lotions on your genital area.  Check your skin around the catheter every day for signs of infection. Check for: ? Redness, swelling, or pain. ? Fluid or blood. ? Warmth. ? Pus or a bad smell. How to empty the bag Supplies needed  Rubbing alcohol.  Gauze pad or cotton ball.  Tape or a leg strap. Emptying the bag Pour the pee out of your bag when it is ?- full, or at least 2-3 times a day. Do this for your overnight bag and your leg bag. 1. Wash your hands with soap and water. 2. Separate (detach) the bag from your leg. 3. Hold the bag over the toilet or a clean pail. Keep the bag lower than your hips and bladder. This is so the pee (urine) does not go back into the tube. 4. Open the pour spout. It is at the bottom of the bag. 5. Empty the  pee into the toilet or pail. Do not let the pour spout touch any surface. 6. Put rubbing alcohol on a gauze pad or cotton ball. 7. Use the gauze pad or cotton ball to clean the pour spout. 8. Close the pour spout. 9. Attach the bag to your leg with tape or a leg strap. 10. Wash your hands with soap and water. Follow instructions for cleaning the drainage bag:  From the product maker.  As told by your doctor. How to change the bag Supplies needed  Alcohol wipes.  A clean bag.  Tape or a leg strap. Changing the bag Replace your bag when it starts to leak, smell bad, or look dirty. 1. Wash your hands with soap and water. 2. Separate the dirty bag from your leg. 3. Pinch the catheter with your fingers so that pee does not spill out. 4. Separate the catheter tube from the bag tube where these tubes connect (at the connection valve). Do not let the tubes touch any surface. 5. Clean the end of the catheter tube with an alcohol wipe. Use a different alcohol wipe to clean the end of the bag tube. 6. Connect the catheter tube to the tube of the clean bag. 7. Attach the clean bag to your leg with tape or a leg strap. Do not make the bag tight on your leg. 8. Wash your hands with soap and water. General rules   Never pull on your catheter. Never try to take it out. Doing that can hurt you.  Always wash your hands before and after you touch your catheter or bag. Use a mild, fragrance-free soap. If you do not have soap and water, use hand sanitizer.  Always make sure there are no twists or bends (kinks) in the catheter tube.  Always make sure there are no leaks in the catheter or bag.  Drink enough fluid to keep your pee pale yellow.  Do not take baths, swim, or use a hot tub.  If you are male, wipe from front to back after you poop (have a bowel movement). Contact a doctor if:  Your pee is cloudy.  Your pee smells worse than usual.  Your catheter gets clogged.  Your catheter  leaks.  Your bladder feels full. Get help right away if:  You have redness, swelling, or pain where the catheter goes into your body.  You have fluid, blood, pus, or a bad smell coming from the area where the catheter goes into your body.  Your skin feels warm   where the catheter goes into your body.  You have a fever.  You have pain in your: ? Belly (abdomen). ? Legs. ? Lower back. ? Bladder.  You see blood in the catheter.  Your pee is pink or red.  You feel sick to your stomach (nauseous).  You throw up (vomit).  You have chills.  Your pee is not draining into the bag.  Your catheter gets pulled out. Summary  An indwelling urinary catheter is a thin tube that is placed into the bladder to help drain pee (urine) out of the body.  The catheter is placed into the part of the body that drains pee from the bladder (urethra).  Taking good care of your catheter will keep it working properly and help prevent problems.  Always wash your hands before and after touching your catheter or bag.  Never pull on your catheter or try to take it out. This information is not intended to replace advice given to you by your health care provider. Make sure you discuss any questions you have with your health care provider. Document Revised: 04/04/2019 Document Reviewed: 07/27/2017 Elsevier Patient Education  2020 [employer]     AMBULATORY SURGERY  DISCHARGE INSTRUCTIONS   1) The drugs that you were given will stay in your system until tomorrow so for the next 24 hours you should not:  A) Drive an automobile B) Make any legal decisions C) Drink any alcoholic beverage   2) You may resume regular meals tomorrow.  Today it is better to start with liquids and gradually work up to solid foods.  You may eat anything you prefer, but it is better to start with liquids, then soup and crackers, and gradually work up to solid foods.   3) Please notify your doctor immediately if  you have any unusual bleeding, trouble breathing, redness and pain at the surgery site, drainage, fever, or pain not relieved by medication.    4) Additional Instructions:        Please contact your physician with any problems or Same Day Surgery at [phone], Monday through Friday 6 am to 4 pm, or [facility] number at [phone]. 

## 2020-03-12 NOTE — Anesthesia Preprocedure Evaluation (Addendum)
Anesthesia Evaluation  Patient identified by MRN, date of birth, ID band Patient awake    Reviewed: Allergy & Precautions, H&P , NPO status , Patient's Chart, lab work & pertinent test results  History of Anesthesia Complications (+) history of anesthetic complications (sore throat for 4 weeks after prior intubation)  Airway Mallampati: III  TM Distance: >3 FB Neck ROM: full    Dental  (+) Teeth Intact   Pulmonary sleep apnea , COPD, neg recent URI, former smoker,    breath sounds clear to auscultation       Cardiovascular Exercise Tolerance: Good hypertension, (-) angina+ CAD, + Past MI, + Cardiac Stents (2 years ago) and + Peripheral Vascular Disease  + Valvular Problems/Murmurs AS  Rhythm:regular Rate:Normal  1. The left ventricle has normal systolic function with an ejection  fraction of 60-65%. The cavity size was normal. There is mildly increased  left ventricular wall thickness. Left ventricular diastolic Doppler  parameters are consistent with impaired  relaxation.  2. The right ventricle has normal systolic function. The cavity was  normal. There is no increase in right ventricular wall thickness. Right  ventricular systolic pressure is mildly elevated with an estimated  pressure of 33.0 mmHg.  3. Left atrial size was moderately dilated.  4. Moderate calcification of the aortic valve. Aortic valve regurgitation  is mild by color flow Doppler. Moderate stenosis of the aortic valve. Mean  gradient of 20 mm Hg, peak gradient of 38 mm Hg, AVA 1.28 cm sq    Neuro/Psych negative neurological ROS  negative psych ROS   GI/Hepatic Neg liver ROS, GERD  ,  Endo/Other  negative endocrine ROS  Renal/GU      Musculoskeletal   Abdominal   Peds  Hematology negative hematology ROS (+)   Anesthesia Other Findings Past Medical History: No date: Aortic stenosis     Comment:  a. eCHO 04/2018: EF to 50-55%, no RWMA,  Gr1DD, mild to               moderate aortic stenosis with mild aortic insufficiency,               mildly dilated left atrium, RVSF normal, PASP mildly               elevated No date: Arthritis     Comment:  lower back, right knee 2003: CAD S/P percutaneous coronary angioplasty     Comment:  a. remote PCI in 2003; b. Myoview 10/18 no ischemia, EF               39% No date: HLD (hyperlipidemia)     Comment:  a. intolerant to statins No date: Hypertension No date: Ischemic cardiomyopathy     Comment:  MILD -- ECHO 04/2018: a. EF to 50-55%, no RWMA, Gr1DD,               mild to moderate aortic stenosis with mild aortic               insufficiency, mildly dilated left atrium, RVSF normal,               PASP mildly elevated 06/2018: Myocardial infarction (HCC) No date: PAD (peripheral artery disease) (HCC) No date: Sleep apnea     Comment:  a. intolerant of CPAP  Past Surgical History: 2003: CARDIAC CATHETERIZATION     Comment:  stent 10/24/2017: CARPAL TUNNEL RELEASE; Right     Comment:  Procedure: CARPAL TUNNEL RELEASE ENDOSCOPIC;  Surgeon:  Poggi, Marshall Cork, MD;  Location: [facility];                Service: Orthopedics;  Laterality: Right;  sleep apnea 01/23/2017: CERVICAL SPINE SURGERY; Right     Comment:  arthrodesis, discectomy, osteophytectomy, decompression               C3-C4, [facility] No date: COLONOSCOPY 07/08/2018: CORONARY STENT INTERVENTION; N/A     Comment:  Procedure: CORONARY STENT INTERVENTION;  Surgeon: [name], MD;  Location: [facility];                Service: Cardiovascular;  Laterality: N/A; No date: JOINT REPLACEMENT; Left     Comment:  tkr 1966: KNEE SURGERY; Right 07/08/2018: LEFT HEART CATH AND CORONARY ANGIOGRAPHY; N/A     Comment:  Procedure: LEFT HEART CATH AND CORONARY ANGIOGRAPHY;                Surgeon: [name], MD;  Location: [facility];  Service: Cardiovascular;   Laterality: N/A; 2005: REPLACEMENT TOTAL KNEE; Left  BMI    Body Mass Index: 33.13 kg/m      Reproductive/Obstetrics negative OB ROS                            Anesthesia Physical Anesthesia Plan  ASA: III  Anesthesia Plan: General ETT   Post-op Pain Management:    Induction:   PONV Risk Score and Plan: Ondansetron, Dexamethasone, Midazolam and Treatment may vary due to age or medical condition  Airway Management Planned:   Additional Equipment:   Intra-op Plan:   Post-operative Plan:   Informed Consent: I have reviewed the patients History and Physical, chart, labs and discussed the procedure including the risks, benefits and alternatives for the proposed anesthesia with the patient or authorized representative who has indicated his/her understanding and acceptance.     Dental Advisory Given  Plan Discussed with: Anesthesiologist  Anesthesia Plan Comments:         Anesthesia Quick Evaluation

## 2020-03-12 NOTE — Transfer of Care (Signed)
Immediate Anesthesia Transfer of Care Note  Patient: Kurt Kelley  Procedure(s) Performed: HOLEP-LASER ENUCLEATION OF THE PROSTATE WITH MORCELLATION (N/A )  Patient Location: PACU  Anesthesia Type:General  Level of Consciousness: awake, alert , oriented and patient cooperative  Airway & Oxygen Therapy: Patient Spontanous Breathing and Patient connected to nasal cannula oxygen  Post-op Assessment: Report given to RN and Post -op Vital signs reviewed and stable  Post vital signs: Reviewed and stable  Last Vitals:  Vitals Value Taken Time  BP 135/81 03/12/20 1015  Temp 36.2 C 03/12/20 1015  Pulse 84 03/12/20 1021  Resp 15 03/12/20 1021  SpO2 97 % 03/12/20 1021  Vitals shown include unvalidated device data.  Last Pain:  Vitals:   03/12/20 1015  TempSrc:   PainSc: 0-No pain      Patients Stated Pain Goal: 0 (03/12/20 6244)  Complications: No apparent anesthesia complications

## 2020-03-13 ENCOUNTER — Other Ambulatory Visit: Payer: Self-pay | Admitting: Cardiovascular Disease

## 2020-03-15 ENCOUNTER — Ambulatory Visit (INDEPENDENT_AMBULATORY_CARE_PROVIDER_SITE_OTHER): Payer: PPO | Admitting: Physician Assistant

## 2020-03-15 ENCOUNTER — Other Ambulatory Visit: Payer: Self-pay

## 2020-03-15 ENCOUNTER — Ambulatory Visit: Payer: PPO | Admitting: Physician Assistant

## 2020-03-15 VITALS — BP 138/73 | HR 83 | Ht 74.0 in | Wt 256.0 lb

## 2020-03-15 DIAGNOSIS — R3914 Feeling of incomplete bladder emptying: Secondary | ICD-10-CM | POA: Diagnosis not present

## 2020-03-15 DIAGNOSIS — N401 Enlarged prostate with lower urinary tract symptoms: Secondary | ICD-10-CM

## 2020-03-15 LAB — BLADDER SCAN AMB NON-IMAGING: Scan Result: 34

## 2020-03-15 IMAGING — CR DG CHEST 2V
3 series · 3 of 3 positions shown · non-contrast
Comparison: Chest x-ray 11/23/2004.

CLINICAL DATA: 71-year-old male with history of midsternal pain
starting 2 days ago with radiation into the neck in the right
shoulder. Some associated shortness of breath. Pain worsening over
the past 2 days.

EXAM:
CHEST - 2 VIEW

[chest pa]
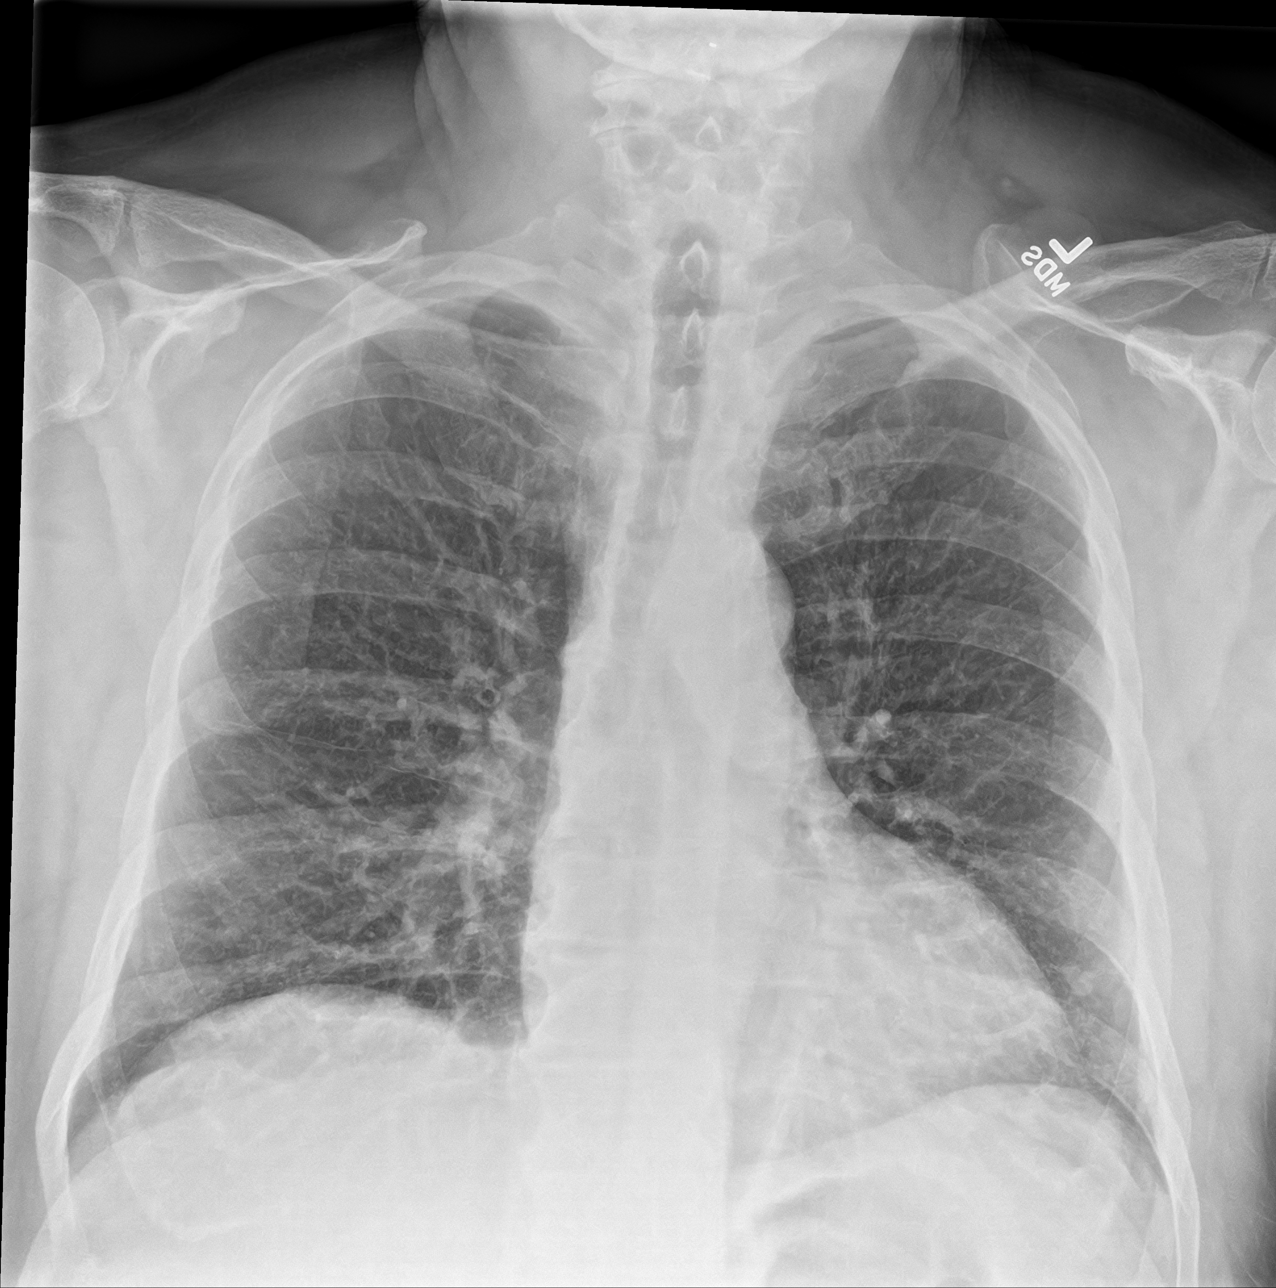

[chest lat (1 of 2)]
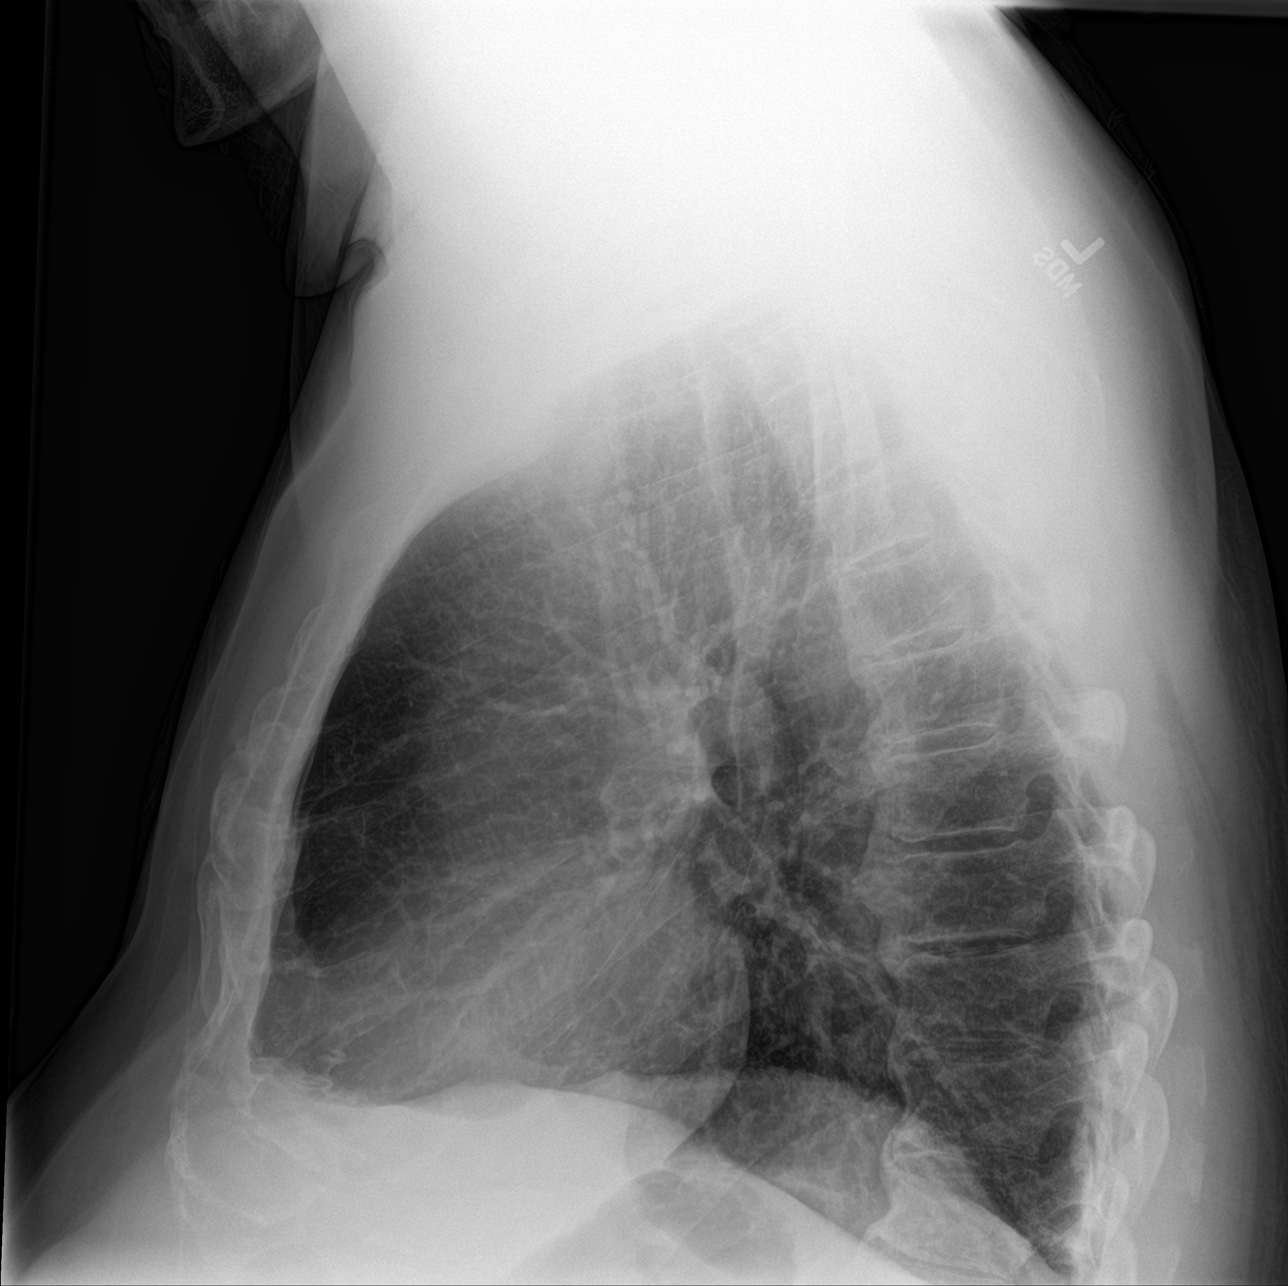

[chest lat (2 of 2)]
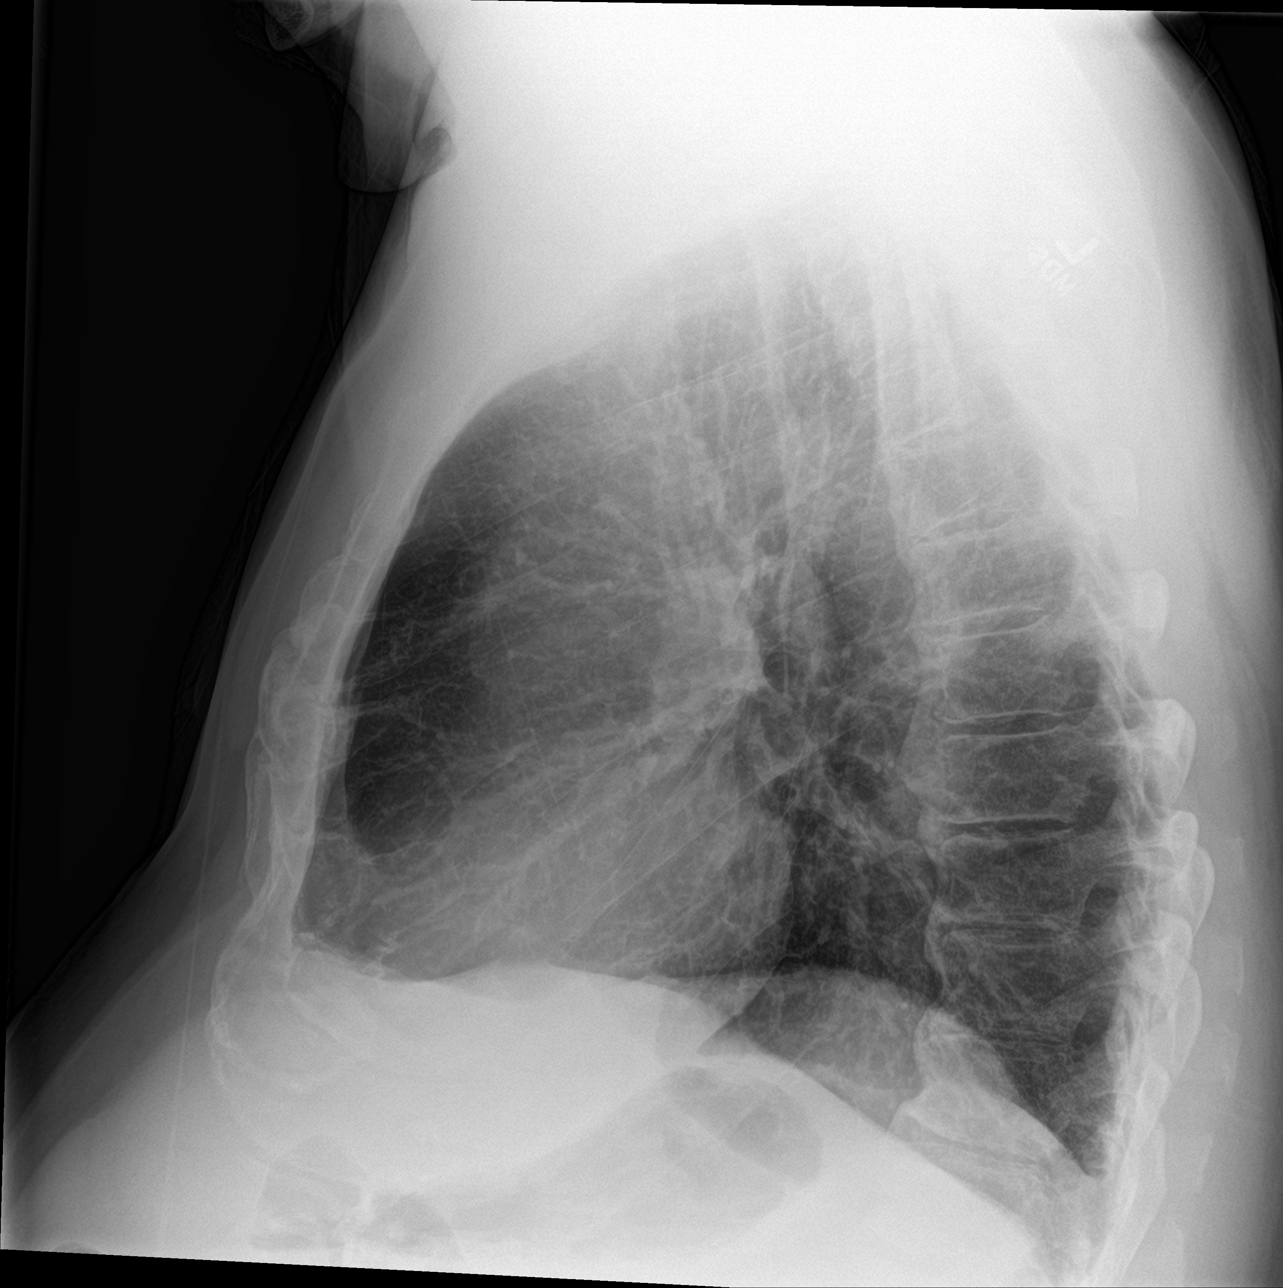

[3 of 3 positions shown; findings below may reference images not displayed]

FINDINGS: Lung volumes are normal. No consolidative airspace disease. No
pleural effusions. No pneumothorax. No pulmonary nodule or mass
noted. Pulmonary vasculature and the cardiomediastinal silhouette
are within normal limits.
IMPRESSION: No radiographic evidence of acute cardiopulmonary disease.

## 2020-03-15 NOTE — Anesthesia Postprocedure Evaluation (Signed)
Anesthesia Post Note  Patient: Kurt Kelley  Procedure(s) Performed: HOLEP-LASER ENUCLEATION OF THE PROSTATE WITH MORCELLATION (N/A )  Patient location during evaluation: PACU Anesthesia Type: General Level of consciousness: awake and alert Pain management: pain level controlled Vital Signs Assessment: post-procedure vital signs reviewed and stable Respiratory status: spontaneous breathing, nonlabored ventilation and respiratory function stable Cardiovascular status: blood pressure returned to baseline and stable Postop Assessment: no apparent nausea or vomiting Anesthetic complications: no     Last Vitals:  Vitals:   03/12/20 1152 03/12/20 1219  BP: 125/65 124/67  Pulse: 88 83  Resp: 16 18  Temp: 36.6 C   SpO2: 99% 98%    Last Pain:  Vitals:   03/12/20 1219  TempSrc:   PainSc: 0-No pain                 Karleen Hampshire

## 2020-03-15 NOTE — Patient Instructions (Signed)

## 2020-03-15 NOTE — Progress Notes (Signed)
Patient returned to clinic this afternoon for follow-up PVR.  He reports drinking approximately 50-60 ounces of fluid during the day.  He has urinated 3 times.  PVR 85mL.  He reports some urinary leakage.  Results for orders placed or performed in visit on 03/15/20  BLADDER SCAN AMB NON-IMAGING  Result Value Ref Range   Scan Result 34     Voiding trial passed.  Instructed patient in Kegel exercises and counseled him to perform 3x10 sets daily.   Follow-Up: Return in 2 months (on 05/15/2020) for Postop f/u and pathology results with Dr. Richardo Hanks.

## 2020-03-15 NOTE — Progress Notes (Signed)
Fill and Pull Catheter Removal  Patient is present today for a catheter removal.  Patient was cleaned and prepped in a sterile fashion of sterile saline was instilled into the bladder when the patient felt the urge to urinate. 38ml of water was then drained from the balloon.  A 24FR three-way foley cath was removed from the bladder no complications were noted .  Patient was then given some time to void on their own.  Patient can void on their own after some time.  Patient tolerated well.  Performed by: Carman Ching, PA-C   Follow up/ Additional notes: Push fluids and RTC this afternoon for PVR.

## 2020-03-16 ENCOUNTER — Telehealth: Payer: Self-pay

## 2020-03-16 LAB — SURGICAL PATHOLOGY

## 2020-03-16 NOTE — Telephone Encounter (Signed)
Called pt informed him of the information below. Pt gave verbal understanding.  

## 2020-03-16 NOTE — Telephone Encounter (Signed)
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 Keep follow up as scheduled, encourage kegel exrecises, thanks  Legrand Rams, MD 03/16/2020

## 2020-03-29 DIAGNOSIS — Z0000 Encounter for general adult medical examination without abnormal findings: Secondary | ICD-10-CM | POA: Diagnosis not present

## 2020-03-29 DIAGNOSIS — E119 Type 2 diabetes mellitus without complications: Secondary | ICD-10-CM | POA: Diagnosis not present

## 2020-03-29 DIAGNOSIS — I10 Essential (primary) hypertension: Secondary | ICD-10-CM | POA: Diagnosis not present

## 2020-03-29 DIAGNOSIS — E785 Hyperlipidemia, unspecified: Secondary | ICD-10-CM | POA: Diagnosis not present

## 2020-04-05 DIAGNOSIS — E785 Hyperlipidemia, unspecified: Secondary | ICD-10-CM | POA: Diagnosis not present

## 2020-04-05 DIAGNOSIS — I10 Essential (primary) hypertension: Secondary | ICD-10-CM | POA: Diagnosis not present

## 2020-04-05 DIAGNOSIS — E119 Type 2 diabetes mellitus without complications: Secondary | ICD-10-CM | POA: Diagnosis not present

## 2020-04-06 DIAGNOSIS — R300 Dysuria: Secondary | ICD-10-CM | POA: Diagnosis not present

## 2020-04-06 DIAGNOSIS — R8290 Unspecified abnormal findings in urine: Secondary | ICD-10-CM | POA: Diagnosis not present

## 2020-04-21 ENCOUNTER — Telehealth: Payer: Self-pay

## 2020-04-21 NOTE — Telephone Encounter (Signed)
Patient notified and scheduled 

## 2020-04-21 NOTE — Telephone Encounter (Signed)
Called pt no answer. Unable to leave message as voicemail is full.     Can you set him up with a PA this week for UA and culture to confirm UTI gone, PVR, and to consider an anticholinergic for his symptoms.  FYI Pollyann Kennedy, MD 04/21/2020

## 2020-04-21 NOTE — Telephone Encounter (Signed)
Patient returned call on triage vmail , attempt to call patient to give message still no answer and vmail is full

## 2020-04-21 NOTE — Telephone Encounter (Signed)
Can you set him up with a PA this week for UA and culture to confirm UTI gone, PVR, and to consider an anticholinergic for his symptoms.  FYI Pollyann Kennedy, MD 04/21/2020

## 2020-04-22 ENCOUNTER — Encounter: Payer: Self-pay | Admitting: Urology

## 2020-04-22 ENCOUNTER — Telehealth: Payer: Self-pay | Admitting: Urology

## 2020-04-22 ENCOUNTER — Other Ambulatory Visit: Payer: Self-pay

## 2020-04-22 ENCOUNTER — Ambulatory Visit: Payer: PPO | Admitting: Urology

## 2020-04-22 VITALS — BP 131/73 | HR 73 | Ht 74.0 in | Wt 255.0 lb

## 2020-04-22 DIAGNOSIS — N3941 Urge incontinence: Secondary | ICD-10-CM

## 2020-04-22 DIAGNOSIS — R310 Gross hematuria: Secondary | ICD-10-CM | POA: Diagnosis not present

## 2020-04-22 DIAGNOSIS — R3914 Feeling of incomplete bladder emptying: Secondary | ICD-10-CM

## 2020-04-22 DIAGNOSIS — N401 Enlarged prostate with lower urinary tract symptoms: Secondary | ICD-10-CM | POA: Diagnosis not present

## 2020-04-22 DIAGNOSIS — R3915 Urgency of urination: Secondary | ICD-10-CM

## 2020-04-22 LAB — BLADDER SCAN AMB NON-IMAGING: Scan Result: 37

## 2020-04-22 MED ORDER — MIRABEGRON ER 50 MG PO TB24: 50.0000 mg | ORAL_TABLET | Freq: Every day | ORAL | 0 refills | Status: DC

## 2020-04-22 NOTE — Telephone Encounter (Signed)
Please let Kurt Kelley know that we received his records from [facility] and I reviewed his CT scan.  It was negative.

## 2020-04-22 NOTE — Progress Notes (Signed)
04/22/2020 11:13 AM   Kurt Kelley 1946-03-31 [id number]  Referring provider: Jerl Mina, MD [address] [facility] [place],  [place] [address]  Chief Complaint  Patient presents with  . Hematuria    HPI: Kurt Kelley is a 74 year old male with hematuria, elevated PSA and BPH with LU TS who presents today for issues with bladder control.  Hematuria (high risk) Former smoker.  Contrast study at [facility] in [place] was negative per patient, records have not been received.  Cystoscopy 12/2019 with Dr. [name] marked lateral lobe enlargement prostate; hypervascularity, inflammatory changes prostatic urethra.  Elevated bladder neck.  He has not experienced any gross hematuria.  His UA today with > 30 RBC's.    Elevated PSA PSA 3.76 in 05/2019 Component     Latest Ref Rng & Units 02/04/2020  Prostate Specific Ag, Serum     0.0 - 4.0 ng/mL 5.8 (H)  Prostate chips from HoLEP 02/2020 were negative   BPH with LU TS Treated with HoLEP 02/2020 with Dr. Richardo Hanks PVR 37 mL    He was having symptoms of urgency and incontinence when he saw his primary care physician Dr. Burnett Sheng so UA and urine culture was ordered.  UA at PCP on 04/05/2020 yellow clear, specific gravity 1.025, pH 6.0, 100 protein, negative glucose, negative ketones, large blood, negative nitrite, moderate leukocytes on dip with 10-50 WBCs, 10-50 RBCs and moderate bacteria on microscopic exam.  Urine culture positive for staphylococcus sciuri.  He was placed on a 7 day course of Cipro 250 mg BID.   He is still experiencing urinary incontinence and is concerned as he is going on a motorcycle trip on May 10th and it will be difficult to stop frequently to urinate.  His UA today is yellow cloudy, on dip specific gravity 1.025, 2+ blood, pH 7.0, 3+ protein and 2+ leukocyte.  Microscopically greater than 30 WBCs, greater than 30 RBCs, 0-10 epithelial cells, hyaline casts are present with few bacteria.  His  PVR is 37 mL.    PMH: Past Medical History:  Diagnosis Date  . Aortic stenosis    a. eCHO 04/2018: EF to 50-55%, no RWMA, Gr1DD, mild to moderate aortic stenosis with mild aortic insufficiency, mildly dilated left atrium, RVSF normal, PASP mildly elevated  . Arthritis    lower back, right knee  . CAD S/P percutaneous coronary angioplasty 2003   a. remote PCI in 2003; b. Myoview 10/18 no ischemia, EF 39%  . HLD (hyperlipidemia)    a. intolerant to statins  . Hypertension   . Ischemic cardiomyopathy    MILD -- ECHO 04/2018: a. EF to 50-55%, no RWMA, Gr1DD, mild to moderate aortic stenosis with mild aortic insufficiency, mildly dilated left atrium, RVSF normal, PASP mildly elevated  . Myocardial infarction (HCC) 06/2018  . PAD (peripheral artery disease) (HCC)   . Sleep apnea    a. intolerant of CPAP    Surgical History: Past Surgical History:  Procedure Laterality Date  . CARDIAC CATHETERIZATION  2003   stent  . CARPAL TUNNEL RELEASE Right 10/24/2017   Procedure: CARPAL TUNNEL RELEASE ENDOSCOPIC;  Surgeon: Christena Flake, MD;  Location: [facility];  Service: Orthopedics;  Laterality: Right;  sleep apnea  . CERVICAL SPINE SURGERY Right 01/23/2017   arthrodesis, discectomy, osteophytectomy, decompression  C3-C4, [facility]  . COLONOSCOPY    . CORONARY STENT INTERVENTION N/A 07/08/2018   Procedure: CORONARY STENT INTERVENTION;  Surgeon: Iran Ouch, MD;  Location: [facility];  Service: Cardiovascular;  Laterality: N/A;  . HOLEP-LASER ENUCLEATION OF THE PROSTATE WITH MORCELLATION N/A 03/12/2020   Procedure: HOLEP-LASER ENUCLEATION OF THE PROSTATE WITH MORCELLATION;  Surgeon: Sondra Come, MD;  Location: [facility];  Service: Urology;  Laterality: N/A;  . JOINT REPLACEMENT Left    tkr  . KNEE SURGERY Right 1966  . LEFT HEART CATH AND CORONARY ANGIOGRAPHY N/A 07/08/2018   Procedure: LEFT HEART CATH AND CORONARY ANGIOGRAPHY;  Surgeon: Iran Ouch, MD;  Location:  [facility];  Service: Cardiovascular;  Laterality: N/A;  . REPLACEMENT TOTAL KNEE Left 2005    Home Medications:  Allergies as of 04/22/2020      Reactions   Brilinta [ticagrelor] Other (See Comments)    blood for 2 weeks when voiding   Capsicum Swelling   Paprika and black pepper both cause swelling of the lips   Ampicillin Itching   Did it involve swelling of the face/tongue/throat, SOB, or low BP? No Did it involve sudden or severe rash/hives, skin peeling, or any reaction on the inside of your mouth or nose? No Did you need to seek medical attention at a hospital or doctor's office? No When did it last happen?10 years If all above answers are "NO", may proceed with cephalosporin use.      Medication List       Accurate as of April 22, 2020 11:59 PM. If you have any questions, ask your nurse or doctor.        albuterol 108 (90 Base) MCG/ACT inhaler Commonly known as: VENTOLIN HFA Inhale 2 puffs into the lungs daily.   amLODipine 5 MG tablet Commonly known as: NORVASC Take 5 mg by mouth at bedtime.   aspirin 81 MG tablet Take 81 mg by mouth at bedtime.   CLEAR EYES OP Place 1 drop into both eyes daily as needed (redness).   ezetimibe 10 MG tablet Commonly known as: ZETIA Take 1 tablet (10 mg total) by mouth daily.   fenofibrate 145 MG tablet Commonly known as: TRICOR Take 1 tablet (145 mg total) by mouth daily.   fluticasone 50 MCG/ACT nasal spray Commonly known as: FLONASE Place 1 spray into both nostrils at bedtime.   ibuprofen 800 MG tablet Commonly known as: ADVIL Take 800 mg by mouth daily as needed for moderate pain.   isosorbide mononitrate 30 MG 24 hr tablet Commonly known as: IMDUR TAKE 1 TABLET BY MOUTH EVERY DAY   losartan 100 MG tablet Commonly known as: COZAAR TAKE 1 TABLET BY MOUTH EVERY DAY   methocarbamol 750 MG tablet Commonly known as: ROBAXIN Take 750 mg by mouth daily as needed for muscle spasms.   mirabegron ER  50 MG Tb24 tablet Commonly known as: MYRBETRIQ Take 1 tablet (50 mg total) by mouth daily. Started by: Michiel Cowboy, PA-C   nitroGLYCERIN 0.4 MG SL tablet Commonly known as: NITROSTAT PLACE 1 TABLET UNDER THE TONGUE EVERY 5 (FIVE) MINUTES AS NEEDED FOR CHEST PAIN. MAXIMUM OF 3 DOSES.       Allergies:  Allergies  Allergen Reactions  . Brilinta [Ticagrelor] Other (See Comments)     blood for 2 weeks when voiding  . Capsicum Swelling    Paprika and black pepper both cause swelling of the lips  . Ampicillin Itching    Did it involve swelling of the face/tongue/throat, SOB, or low BP? No Did it involve sudden or severe rash/hives, skin peeling, or any reaction on the inside  of your mouth or nose? No Did you need to seek medical attention at a hospital or doctor's office? No When did it last happen?10 years If all above answers are "NO", may proceed with cephalosporin use.     Family History: Family History  Problem Relation Age of Onset  . CAD Father   . Lung cancer Father   . Kidney cancer Father   . Diabetes Mother   . Alzheimer's disease Mother     Social History:  reports that he quit smoking about 33 years ago. He has never used smokeless tobacco. He reports current alcohol use of about 11.0 standard drinks of alcohol per week. He reports previous drug use.  ROS: Pertinent ROS in HPI  Physical Exam: BP 131/73   Pulse 73   Ht 6\' 2"  (1.88 m)   Wt 255 lb (115.7 kg)   BMI 32.74 kg/m   Constitutional:  Well nourished. Alert and oriented, No acute distress. HEENT: [place] AT, mask in place.  Trachea midline. Cardiovascular: No clubbing, cyanosis, or edema. Respiratory: Normal respiratory effort, no increased work of breathing. Neurologic: Grossly intact, no focal deficits, moving all 4 extremities. Psychiatric: Normal mood and affect.  Laboratory Data: Lab Results  Component Value Date   WBC 7.3 01/15/2020   HGB 16.7 02/04/2020   HCT 48.8 02/04/2020   MCV  82 01/15/2020   PLT 212 01/15/2020    Lab Results  Component Value Date   CREATININE 1.08 01/15/2020    Lab Results  Component Value Date   HGBA1C 7.5 (H) 07/05/2018       Component Value Date/Time   CHOL 124 07/06/2018 0530   HDL 28 (L) 07/06/2018 0530   CHOLHDL 4.4 07/06/2018 0530   VLDL 68 (H) 07/06/2018 0530   LDLCALC 28 07/06/2018 0530    Lab Results  Component Value Date   AST 19 01/15/2020   Lab Results  Component Value Date   ALT 40 01/15/2020    Urinalysis Component     Latest Ref Rng & Units 04/22/2020  Specific Gravity, UA     1.005 - 1.030 1.025  pH, UA     5.0 - 7.5 7.0  Color, UA     Yellow Yellow  Appearance Ur     Clear Cloudy (A)  Leukocytes,UA     Negative 2+ (A)  Protein,UA     Negative/Trace 3+ (A)  Glucose, UA     Negative Negative  Ketones, UA     Negative Negative  RBC, UA     Negative 2+ (A)  Bilirubin, UA     Negative Negative  Urobilinogen, Ur     0.2 - 1.0 mg/dL 0.2  Nitrite, UA     Negative Negative  Microscopic Examination      See below:   Component     Latest Ref Rng & Units 04/22/2020  WBC, UA     0 - 5 /hpf >30 (A)  RBC     0 - 2 /hpf >30 (A)  Epithelial Cells (non renal)     0 - 10 /hpf 0-10  Casts     None seen /lpf Present (A)  Cast Type     N/A Hyaline casts  Crystals     N/A Present  Crystal Type     N/A Amorphous Sediment  Bacteria, UA     None seen/Few Few    I have reviewed the labs.   Pertinent Imaging: Results for LENA, FIELDHOUSE (MRN [id number]) as  of 04/28/2020 11:07  Ref. Range 04/22/2020 11:24  Scan Result Unknown 37    Assessment & Plan:    1.  Urinary urgency Explained to the patient that he is still in the early postoperative period from his HoLEP and that urgency and urge incontinence are typical after effects from the surgery as the bladder learns to " navigate the new landscape" caused by the HoLEP.  It is not likely that his symptoms are caused by a urinary tract infection as  the Cipro provided no improvement in his symptoms.  I have given him Myrbetriq 50 mg daily, #28 samples to take to see if we can decrease his urgency and urge incontinence for his motorcycle trip.  He will contact the office if he does not find them effective.  2. Benign prostatic hyperplasia with incomplete bladder emptying - s/p HoLEP 02/2020 - keep follow up with Dr. Richardo Hanks on 05/12/2020  Return for Keep follow up with Dr. Richardo Hanks .  These notes generated with voice recognition software. I apologize for typographical errors.  Michiel Cowboy, PA-C  [facility] [address]  [address] [place], [place] [address] [phone]

## 2020-04-25 LAB — CULTURE, URINE COMPREHENSIVE

## 2020-04-26 LAB — URINALYSIS, COMPLETE
Bilirubin, UA: NEGATIVE
Glucose, UA: NEGATIVE
Ketones, UA: NEGATIVE
Nitrite, UA: NEGATIVE
Specific Gravity, UA: 1.025 (ref 1.005–1.030)
Urobilinogen, Ur: 0.2 mg/dL (ref 0.2–1.0)
pH, UA: 7 (ref 5.0–7.5)

## 2020-04-26 LAB — MICROSCOPIC EXAMINATION
RBC, Urine: >30 /hpf — AB (ref 0–2)
WBC, UA: >30 /hpf — AB (ref 0–5)

## 2020-04-26 NOTE — Telephone Encounter (Signed)
Patient's wife notified and voiced understanding.

## 2020-05-06 ENCOUNTER — Ambulatory Visit: Payer: PPO | Admitting: Urology

## 2020-05-12 ENCOUNTER — Encounter: Payer: Self-pay | Admitting: Urology

## 2020-05-12 ENCOUNTER — Other Ambulatory Visit: Payer: Self-pay

## 2020-05-12 ENCOUNTER — Ambulatory Visit: Payer: PPO | Admitting: Urology

## 2020-05-12 VITALS — BP 156/84 | HR 80 | Ht 74.0 in | Wt 255.0 lb

## 2020-05-12 DIAGNOSIS — N401 Enlarged prostate with lower urinary tract symptoms: Secondary | ICD-10-CM | POA: Diagnosis not present

## 2020-05-12 DIAGNOSIS — N390 Urinary tract infection, site not specified: Secondary | ICD-10-CM | POA: Diagnosis not present

## 2020-05-12 DIAGNOSIS — R3914 Feeling of incomplete bladder emptying: Secondary | ICD-10-CM | POA: Diagnosis not present

## 2020-05-12 LAB — BLADDER SCAN AMB NON-IMAGING

## 2020-05-12 NOTE — Patient Instructions (Addendum)
1.  Take Aleve or ibuprofen every day for 10 days 2.  Avoid tea, alcohol, and caffeine, as these will irritate the bladder as it is healing 3.  We will call you with your urine sample results  Kegel Exercises  Kegel exercises can help strengthen your pelvic floor muscles. The pelvic floor is a group of muscles that support your rectum, small intestine, and bladder. In females, pelvic floor muscles also help support the womb (uterus). These muscles help you control the flow of urine and stool. Kegel exercises are painless and simple, and they do not require any equipment. Your provider may suggest Kegel exercises to:  Improve bladder and bowel control.  Improve sexual response.  Improve weak pelvic floor muscles after surgery to remove the uterus (hysterectomy) or pregnancy (females).  Improve weak pelvic floor muscles after prostate gland removal or surgery (males). Kegel exercises involve squeezing your pelvic floor muscles, which are the same muscles you squeeze when you try to stop the flow of urine or keep from passing gas. The exercises can be done while sitting, standing, or lying down, but it is best to vary your position. Exercises How to do Kegel exercises: 1. Squeeze your pelvic floor muscles tight. You should feel a tight lift in your rectal area. If you are a male, you should also feel a tightness in your vaginal area. Keep your stomach, buttocks, and legs relaxed. 2. Hold the muscles tight for up to 10 seconds. 3. Breathe normally. 4. Relax your muscles. 5. Repeat as told by your health care provider. Repeat this exercise daily as told by your health care provider. Continue to do this exercise for at least 4-6 weeks, or for as long as told by your health care provider. You may be referred to a physical therapist who can help you learn more about how to do Kegel exercises. Depending on your condition, your health care provider may recommend:  Varying how long you squeeze  your muscles.  Doing several sets of exercises every day.  Doing exercises for several weeks.  Making Kegel exercises a part of your regular exercise routine. This information is not intended to replace advice given to you by your health care provider. Make sure you discuss any questions you have with your health care provider. Document Revised: 07/31/2018 Document Reviewed: 07/31/2018 Elsevier Patient Education  2020 [employer].

## 2020-05-12 NOTE — Progress Notes (Signed)
   05/12/2020 9:13 AM   Kurt Kelley [birth year] [id number]  Reason for visit: Follow up HOLEP  HPI: I saw [name] back in urology clinic for his 52-month follow-up after undergoing HOLEP on 03/12/2020.  He had a 160 g prostate with BPH symptoms of weak stream and incomplete emptying, recurrent gross hematuria, and urinary tract infections.  Pathology showed 130 g of benign prostate tissue.  Since surgery, he has been voiding very well with a very strong stream and emptying much better, PVR 68 mL in clinic today.  He did have a Staphylococcus sciuri UTI after surgery on 04/09/2020 with 25-50 K colonies and was treated with 7 days of culture appropriate Cipro.  He reports that he has had persistent urgency, frequency, and incontinence despite treatment of the UTI.  He reports he still has a strong stream and denies any split stream or urinary dribbling.  He was prescribed Myrbetriq by one of our PAs which she does feel improves his urgency and frequency.  He is drinking sweet tea, coffee, and alcohol during the day.  Urinalysis today pending  I had a very long conversation with the patient today providing reassurance about postop expectations after HOLEP.  Intraoperatively he had moderate bladder trabeculations, and with removal of so much tissue in such a massive prostate, I again reassured him that the incontinence and urgency are expected as the bladder just and sometimes can take up to 6 months after surgery.  He does feel like he is doing better now than before surgery still.  -Call with urinalysis results -Avoid bladder irritants -1 week NSAIDs for possible persistent inflammation after UTI -Kegel exercises encouraged -Myrbetriq samples given -RTC 2 months for repeat symptom check   Sondra Come, MD  [facility] [address], [address] [place], [place] [address] [phone]

## 2020-05-13 LAB — URINALYSIS, COMPLETE
Bilirubin, UA: NEGATIVE
Glucose, UA: NEGATIVE
Ketones, UA: NEGATIVE
Nitrite, UA: NEGATIVE
Specific Gravity, UA: 1.025 (ref 1.005–1.030)
Urobilinogen, Ur: 0.2 mg/dL (ref 0.2–1.0)
pH, UA: 7 (ref 5.0–7.5)

## 2020-05-13 LAB — MICROSCOPIC EXAMINATION: Bacteria, UA: NONE SEEN

## 2020-05-15 LAB — CULTURE, URINE COMPREHENSIVE

## 2020-05-17 ENCOUNTER — Telehealth: Payer: Self-pay

## 2020-05-17 NOTE — Telephone Encounter (Signed)
Called pt informed him of the information below. Pt gave verbal understanding.  

## 2020-05-17 NOTE — Telephone Encounter (Signed)
-----   Message from Sondra Come, MD sent at 05/16/2020 11:39 AM EDT ----- No UTI on urine culture, keep follow up as scheduled  Thanks Legrand Rams, MD 05/16/2020

## 2020-06-03 ENCOUNTER — Other Ambulatory Visit: Payer: Self-pay | Admitting: Cardiovascular Disease

## 2020-06-09 ENCOUNTER — Other Ambulatory Visit: Payer: Self-pay

## 2020-06-09 MED ORDER — ISOSORBIDE MONONITRATE ER 30 MG PO TB24: 30.0000 mg | ORAL_TABLET | Freq: Every day | ORAL | 0 refills | Status: DC

## 2020-06-10 ENCOUNTER — Other Ambulatory Visit: Payer: Self-pay

## 2020-06-10 ENCOUNTER — Ambulatory Visit (INDEPENDENT_AMBULATORY_CARE_PROVIDER_SITE_OTHER): Payer: PPO

## 2020-06-10 DIAGNOSIS — I350 Nonrheumatic aortic (valve) stenosis: Secondary | ICD-10-CM

## 2020-06-10 MED ORDER — PERFLUTREN LIPID MICROSPHERE
1.0000 mL | INTRAVENOUS | Status: AC | PRN
  Administered 2020-06-10: 2 mL via INTRAVENOUS

## 2020-06-15 ENCOUNTER — Other Ambulatory Visit: Payer: Self-pay

## 2020-06-15 DIAGNOSIS — N3941 Urge incontinence: Secondary | ICD-10-CM

## 2020-06-15 DIAGNOSIS — R3915 Urgency of urination: Secondary | ICD-10-CM

## 2020-06-15 MED ORDER — MIRABEGRON ER 50 MG PO TB24: 50.0000 mg | ORAL_TABLET | Freq: Every day | ORAL | 11 refills | Status: DC

## 2020-06-15 NOTE — Telephone Encounter (Signed)
Spoke with patients wife. Sent in rx to pharmacy. Patient aware

## 2020-06-16 ENCOUNTER — Other Ambulatory Visit: Payer: PPO

## 2020-06-16 ENCOUNTER — Other Ambulatory Visit: Payer: Self-pay

## 2020-06-16 MED ORDER — MIRABEGRON ER 50 MG PO TB24: 50.0000 mg | ORAL_TABLET | Freq: Every day | ORAL | 11 refills | Status: DC

## 2020-06-17 ENCOUNTER — Other Ambulatory Visit: Payer: Self-pay

## 2020-06-25 DIAGNOSIS — G8929 Other chronic pain: Secondary | ICD-10-CM | POA: Diagnosis not present

## 2020-06-25 DIAGNOSIS — Z981 Arthrodesis status: Secondary | ICD-10-CM | POA: Diagnosis not present

## 2020-06-25 DIAGNOSIS — M5136 Other intervertebral disc degeneration, lumbar region: Secondary | ICD-10-CM | POA: Diagnosis not present

## 2020-06-25 DIAGNOSIS — M4316 Spondylolisthesis, lumbar region: Secondary | ICD-10-CM | POA: Diagnosis not present

## 2020-06-25 DIAGNOSIS — M5030 Other cervical disc degeneration, unspecified cervical region: Secondary | ICD-10-CM | POA: Diagnosis not present

## 2020-06-25 DIAGNOSIS — M5135 Other intervertebral disc degeneration, thoracolumbar region: Secondary | ICD-10-CM | POA: Diagnosis not present

## 2020-06-25 DIAGNOSIS — M40294 Other kyphosis, thoracic region: Secondary | ICD-10-CM | POA: Diagnosis not present

## 2020-06-25 DIAGNOSIS — Z09 Encounter for follow-up examination after completed treatment for conditions other than malignant neoplasm: Secondary | ICD-10-CM | POA: Diagnosis not present

## 2020-06-25 DIAGNOSIS — Z96652 Presence of left artificial knee joint: Secondary | ICD-10-CM | POA: Diagnosis not present

## 2020-06-25 DIAGNOSIS — M545 Low back pain: Secondary | ICD-10-CM | POA: Diagnosis not present

## 2020-06-25 DIAGNOSIS — M4802 Spinal stenosis, cervical region: Secondary | ICD-10-CM | POA: Diagnosis not present

## 2020-06-25 DIAGNOSIS — M5134 Other intervertebral disc degeneration, thoracic region: Secondary | ICD-10-CM | POA: Diagnosis not present

## 2020-06-30 ENCOUNTER — Other Ambulatory Visit: Payer: Self-pay | Admitting: Physician Assistant

## 2020-06-30 DIAGNOSIS — M4802 Spinal stenosis, cervical region: Secondary | ICD-10-CM

## 2020-06-30 DIAGNOSIS — M545 Low back pain, unspecified: Secondary | ICD-10-CM

## 2020-07-13 ENCOUNTER — Other Ambulatory Visit: Payer: Self-pay | Admitting: Cardiovascular Disease

## 2020-07-14 ENCOUNTER — Other Ambulatory Visit: Payer: Self-pay

## 2020-07-14 ENCOUNTER — Encounter: Payer: Self-pay | Admitting: Urology

## 2020-07-14 ENCOUNTER — Ambulatory Visit: Payer: PPO | Admitting: Urology

## 2020-07-14 ENCOUNTER — Ambulatory Visit
Admission: RE | Admit: 2020-07-14 | Discharge: 2020-07-14 | Disposition: A | Discharge status: Home / Self Care | Payer: PPO | Source: Ambulatory Visit | Attending: Physician Assistant | Admitting: Physician Assistant

## 2020-07-14 VITALS — BP 176/83 | HR 76 | Ht 74.0 in | Wt 255.0 lb

## 2020-07-14 DIAGNOSIS — N390 Urinary tract infection, site not specified: Secondary | ICD-10-CM

## 2020-07-14 DIAGNOSIS — R310 Gross hematuria: Secondary | ICD-10-CM | POA: Diagnosis not present

## 2020-07-14 DIAGNOSIS — M4802 Spinal stenosis, cervical region: Secondary | ICD-10-CM

## 2020-07-14 DIAGNOSIS — N401 Enlarged prostate with lower urinary tract symptoms: Secondary | ICD-10-CM

## 2020-07-14 DIAGNOSIS — G8929 Other chronic pain: Secondary | ICD-10-CM | POA: Diagnosis not present

## 2020-07-14 DIAGNOSIS — R3914 Feeling of incomplete bladder emptying: Secondary | ICD-10-CM

## 2020-07-14 DIAGNOSIS — M542 Cervicalgia: Secondary | ICD-10-CM | POA: Diagnosis not present

## 2020-07-14 DIAGNOSIS — M545 Low back pain: Secondary | ICD-10-CM | POA: Diagnosis not present

## 2020-07-14 LAB — BLADDER SCAN AMB NON-IMAGING: Scan Result: 0

## 2020-07-14 NOTE — Progress Notes (Signed)
° °  07/14/2020 2:41 PM   Kurt Kelley 11/07/1946 [id number]  Reason for visit: Follow up HoLEP  HPI: I saw [name] back in urology clinic today in follow-up after undergoing HOLEP on 03/12/2020 for a 160 g prostate with persistent significant gross hematuria from BPH, incomplete bladder emptying, bothersome urinary symptoms, and recurrent UTIs.  130 g of tissue were removed showing only benign prostate.  Not unexpectedly, he was having severe urgency, frequency, and some urge incontinence for the first 6 weeks postop secondary to his massive prostate and moderate to severe bladder trabeculations.  He has cut out tea and other bladder irritants.  He reports complete resolution of his bothersome urinary symptoms at this time, and he reports he is doing extremely well.  He denies any recurrent gross hematuria.  He feels he is emptying with a strong stream and denies any urgency or incontinence.  He is delighted with his urinary symptoms at this time.  We reviewed that we would not recommend ongoing PSA screening, and we reviewed the AUA guidelines that do not recommend routine screening in men over age 71.  PVR 0 mL today.  RTC 6 months for IPSS and PVR, he would like to continue yearly follow-ups after that visit  Sondra Come, MD  [facility] [address] [place], [place] [address] [phone]

## 2020-07-14 NOTE — Patient Instructions (Signed)
So glad you're doing well! Try to minimize fluid intake after 6 or 7pm, and urinate right before bed.  See you in 6 months!  Kurt Kelley

## 2020-07-22 ENCOUNTER — Encounter: Payer: Self-pay | Admitting: Cardiovascular Disease

## 2020-07-22 ENCOUNTER — Other Ambulatory Visit: Payer: Self-pay

## 2020-07-22 ENCOUNTER — Ambulatory Visit: Payer: PPO | Admitting: Cardiovascular Disease

## 2020-07-22 VITALS — BP 150/72 | HR 69 | Ht 75.0 in | Wt 268.0 lb

## 2020-07-22 DIAGNOSIS — I350 Nonrheumatic aortic (valve) stenosis: Secondary | ICD-10-CM | POA: Diagnosis not present

## 2020-07-22 DIAGNOSIS — I10 Essential (primary) hypertension: Secondary | ICD-10-CM | POA: Diagnosis not present

## 2020-07-22 DIAGNOSIS — I25118 Atherosclerotic heart disease of native coronary artery with other forms of angina pectoris: Secondary | ICD-10-CM | POA: Diagnosis not present

## 2020-07-22 DIAGNOSIS — I255 Ischemic cardiomyopathy: Secondary | ICD-10-CM

## 2020-07-22 NOTE — Patient Instructions (Signed)
Medication Instructions:  No changes  If you need a refill on your cardiac medications before your next appointment, please call your pharmacy.    Lab work: No new labs needed   If you have labs (blood work) drawn today and your tests are completely normal, you will receive your results only by: . MyChart Message (if you have MyChart) OR . A paper copy in the mail If you have any lab test that is abnormal or we need to change your treatment, we will call you to review the results.   Testing/Procedures: No new testing needed   Follow-Up: At [facility], you and your health needs are our priority.  As part of our continuing mission to provide you with exceptional heart care, we have created designated Provider Care Teams.  These Care Teams include your primary Cardiologist (physician) and Advanced Practice Providers (APPs -  Physician Assistants and Nurse Practitioners) who all work together to provide you with the care you need, when you need it.  . You will need a follow up appointment in 6 months  . Providers on your designated Care Team:   . Christopher Berge, NP . Ryan Dunn, PA-C . Jacquelyn Visser, PA-C  Any Other Special Instructions Will Be Listed Below (If Applicable).  COVID-19 Vaccine Information can be found at: [email or web address] For questions related to vaccine distribution or appointments, please email [email or web address] or call [phone].     

## 2020-07-22 NOTE — Progress Notes (Signed)
Cardiology Office Note   Date:  07/22/2020   ID:  Kurt Kelley, DOB [birth year], MRN [id number]  PCP:  Jerl Mina, MD  Cardiologist:  Lorine Bears, MD   Chief Complaint  Patient presents with  . other    6 month follow up. Meds reviewed by the pt. verbally. "doing well."       History of Present Illness: Kurt Kelley is a 74 y.o. male with past medical history of  coronary artery disease,  chronic systolic heart failure Moderate aortic stenosis. Who presents for follow-up of his coronary disease, chronic stable angina  History reviewed on today's visit known history of coronary artery disease with previous stent placement in 2003, chronic systolic heart failure due to mild ischemic cardiomyopathy,  aortic stenosis,  essential hypertension,  hyperlipidemia,  sleep apnea  obesity.     history of intolerance to statins due to severe myalgia.   intolerance to CPAP due to claustrophobia.    peripheral arterial disease and was seen many years ago by Dr. Orson Slick and was told about moderate 50% disease that did not require revascularization.  Vascular evaluation in May 2019 showed an ABI of 1.05 on the right and 0.97 on the left.  Duplex showed mild atherosclerosis in the right lower extremity.  On the left, there was moderate disease affecting the distal SFA.  hospitalized in July 2019 with non-ST elevation myocardial infarction.   Cardiac catheterization showed mild LAD disease, 95% stenosis in the mid left circumflex, patent RCA stent and 60% distal RCA stenosis.  angioplasty and drug-eluting stent placement to the left circumflex.    moderate aortic stenosis with a peak gradient of 15 to 20 mmHg.   EF was 50 to 55%.    did not tolerate beta-blockers.  started on Repatha but unfortunately was not able to continue due to cost.  He had hematuria on dual antiplatelet therapy that improved after discontinuing Brilinta. Lexiscan Myoview in November 2020  showed no  evidence of ischemia.  I also added Zetia 10 mg daily during last visit.  He did undergo cystoscopy which showed marked BPH felt to be the source of hematuria.  There was no evidence of tumors.  echocardiogram in June 2021 reviewed with him on today's visit  showed an EF of 60 to 65% with stable moderate aortic stenosis.  Mean gradient was 26 mmHg with a valve area of 1.47.  Walks daily, 1/2 mile to 1 mile, sometimes up a hill Some SOB, typically more with hills and stairs  Last weekend, "shirt was moving", felt arrhythmia for 1 min Never had symptoms like this before  EKG personally reviewed by myself on todays visit Shows normal sinus rhythm rate 69 bpm nonspecific ST-T wave abnormality   Past Medical History:  Diagnosis Date  . Aortic stenosis    a. eCHO 04/2018: EF to 50-55%, no RWMA, Gr1DD, mild to moderate aortic stenosis with mild aortic insufficiency, mildly dilated left atrium, RVSF normal, PASP mildly elevated  . Arthritis    lower back, right knee  . CAD S/P percutaneous coronary angioplasty 2003   a. remote PCI in 2003; b. Myoview 10/18 no ischemia, EF 39%  . HLD (hyperlipidemia)    a. intolerant to statins  . Hypertension   . Ischemic cardiomyopathy    MILD -- ECHO 04/2018: a. EF to 50-55%, no RWMA, Gr1DD, mild to moderate aortic stenosis with mild aortic insufficiency, mildly dilated left atrium, RVSF normal, PASP mildly elevated  . Myocardial  infarction (HCC) 06/2018  . PAD (peripheral artery disease) (HCC)   . Sleep apnea    a. intolerant of CPAP    Past Surgical History:  Procedure Laterality Date  . CARDIAC CATHETERIZATION  2003   stent  . CARPAL TUNNEL RELEASE Right 10/24/2017   Procedure: CARPAL TUNNEL RELEASE ENDOSCOPIC;  Surgeon: Christena Flake, MD;  Location: [facility];  Service: Orthopedics;  Laterality: Right;  sleep apnea  . CERVICAL SPINE SURGERY Right 01/23/2017   arthrodesis, discectomy, osteophytectomy, decompression  C3-C4, [facility]  .  COLONOSCOPY    . CORONARY STENT INTERVENTION N/A 07/08/2018   Procedure: CORONARY STENT INTERVENTION;  Surgeon: Iran Ouch, MD;  Location: [facility];  Service: Cardiovascular;  Laterality: N/A;  . HOLEP-LASER ENUCLEATION OF THE PROSTATE WITH MORCELLATION N/A 03/12/2020   Procedure: HOLEP-LASER ENUCLEATION OF THE PROSTATE WITH MORCELLATION;  Surgeon: Sondra Come, MD;  Location: [facility];  Service: Urology;  Laterality: N/A;  . JOINT REPLACEMENT Left    tkr  . KNEE SURGERY Right 1966  . LEFT HEART CATH AND CORONARY ANGIOGRAPHY N/A 07/08/2018   Procedure: LEFT HEART CATH AND CORONARY ANGIOGRAPHY;  Surgeon: Iran Ouch, MD;  Location: [facility];  Service: Cardiovascular;  Laterality: N/A;  . REPLACEMENT TOTAL KNEE Left 2005     Current Outpatient Medications  Medication Sig Dispense Refill  . albuterol (PROVENTIL HFA;VENTOLIN HFA) 108 (90 Base) MCG/ACT inhaler Inhale 2 puffs into the lungs daily.     Marland Kitchen amLODipine (NORVASC) 5 MG tablet TAKE 1 TABLET BY MOUTH EVERY DAY 90 tablet 0  . aspirin 81 MG tablet Take 81 mg by mouth at bedtime.     Marland Kitchen ezetimibe (ZETIA) 10 MG tablet Take 1 tablet (10 mg total) by mouth daily. 90 tablet 3  . fenofibrate (TRICOR) 145 MG tablet Take 1 tablet (145 mg total) by mouth daily. 90 tablet 2  . fluticasone (FLONASE) 50 MCG/ACT nasal spray Place 1 spray into both nostrils at bedtime.     Marland Kitchen ibuprofen (ADVIL) 800 MG tablet Take 1 tablet by mouth daily.    . isosorbide mononitrate (IMDUR) 30 MG 24 hr tablet Take 1 tablet (30 mg total) by mouth daily. 90 tablet 0  . losartan (COZAAR) 100 MG tablet TAKE 1 TABLET BY MOUTH EVERY DAY 90 tablet 0  . methocarbamol (ROBAXIN) 750 MG tablet Take 750 mg by mouth daily as needed for muscle spasms.     . mirabegron ER (MYRBETRIQ) 50 MG TB24 tablet Take 1 tablet (50 mg total) by mouth daily. 30 tablet 11  . Naphazoline HCl (CLEAR EYES OP) Place 1 drop into both eyes daily as needed (redness).    .  nitroGLYCERIN (NITROSTAT) 0.4 MG SL tablet PLACE 1 TABLET UNDER THE TONGUE EVERY 5 (FIVE) MINUTES AS NEEDED FOR CHEST PAIN. MAXIMUM OF 3 DOSES. 25 tablet 0   No current facility-administered medications for this visit.    Allergies:   Brilinta [ticagrelor], Capsicum, and Ampicillin    Social History:  The patient  reports that he quit smoking about 33 years ago. He has never used smokeless tobacco. He reports current alcohol use of about 11.0 standard drinks of alcohol per week. He reports previous drug use.   Family History:  The patient's family history includes Alzheimer's disease in his mother; CAD in his father; Diabetes in his mother; Kidney cancer in his father; Lung cancer in his father.   Review of Systems  Constitutional: Negative.   HENT:  Negative.   Respiratory: Negative.   Cardiovascular: Negative.   Gastrointestinal: Negative.   Musculoskeletal: Negative.   Neurological: Negative.   Psychiatric/Behavioral: Negative.   All other systems reviewed and are negative.   PHYSICAL EXAM: VS:  BP (!) 150/72 (BP Location: Left Arm, Patient Position: Sitting, Cuff Size: Large)   Pulse 69   Ht 6\' 3"  (1.905 m)   Wt (!) 268 lb (121.6 kg)   SpO2 98%   BMI 33.50 kg/m  , BMI Body mass index is 33.5 kg/m. Constitutional:  oriented to person, place, and time. No distress.  HENT:  Head: Grossly normal Eyes:  no discharge. No scleral icterus.  Neck: No JVD, no carotid bruits  Cardiovascular: Regular rate and rhythm, 2/6 systolic ejection murmur right sternal border Pulmonary/Chest: Clear to auscultation bilaterally, no wheezes or rails Abdominal: Soft.  no distension.  no tenderness.  Musculoskeletal: Normal range of motion Neurological:  normal muscle tone. Coordination normal. No atrophy Skin: Skin warm and dry Psychiatric: normal affect, pleasant   EKG personally reviewed by myself on todays visit As detailed above normal rhythm with ST-T wave abnormality  Recent  Labs: 01/15/2020: ALT 40; BUN 18; Creatinine, Ser 1.08; Platelets 212; Potassium 4.9; Sodium 139 02/04/2020: Hemoglobin 16.7    Lipid Panel    Component Value Date/Time   CHOL 124 07/06/2018 0530   TRIG 341 (H) 07/06/2018 0530   HDL 28 (L) 07/06/2018 0530   CHOLHDL 4.4 07/06/2018 0530   VLDL 68 (H) 07/06/2018 0530   LDLCALC 28 07/06/2018 0530    total chol 142, LDL 81   Wt Readings from Last 3 Encounters:  07/22/20 (!) 268 lb (121.6 kg)  07/14/20 255 lb (115.7 kg)  05/12/20 255 lb (115.7 kg)       PAD Screen 04/09/2018  Previous PAD dx? No  Previous surgical procedure? No  Pain with walking? Yes  Subsides with rest? No  Feet/toe relief with dangling? No  Painful, non-healing ulcers? No  Extremities discolored? Yes      ASSESSMENT AND PLAN:  1.  Coronary artery disease involving native coronary arteries with other forms of angina:    residual 60% disease .   Follow-up Lexiscan Myoview showed no evidence of ischemia.   Currently with no symptoms of angina. No further workup at this time.  Continue current medication regimen.  2. Peripheral arterial disease:   mildly reduced ABI on the left side with evidence of borderline significant disease affecting the left SFA.   Currently with no claudications on today's visit, this was again confirmed with him, no change from prior office visits Walks daily, limited by back issues  3.   moderate aortic stenosis:  Stable on most recent echocardiogram last month. Given stability, recommend repeat echocardiogram in June 2023. Discussed various types of surgery that should be available to him in the future Echocardiogram in 1 year, consider 2 years if no symptoms  4.  Chronic systolic heart failure: Currently on losartan.  Most recent EF was normal.    He did not tolerate beta-blockers. Appears euvolemic on today's visit  5.  Hyperlipidemia: Intolerance to statins.   Could not afford Repatha, will continue on Zetia and  fenofibrate Numbers at goal  6.  Essential hypertension:  Long walk into the office today, stressing out to try to get here in time Recommend he closely monitor blood pressure at home and call July 2023 with some numbers Mild improvement on my recheck in the office down 10 points  on  Imdur, losartan and amlodipine.   untreated sleep apnea is contributing.     Total encounter time more than 25 minutes  Greater than 50% was spent in counseling and coordination of care with the patient   Signed, Dossie Arbour, MD, Ph.D [facility]

## 2020-08-04 ENCOUNTER — Other Ambulatory Visit: Payer: Self-pay | Admitting: Cardiovascular Disease

## 2020-08-10 DIAGNOSIS — M5136 Other intervertebral disc degeneration, lumbar region: Secondary | ICD-10-CM | POA: Diagnosis not present

## 2020-08-10 DIAGNOSIS — M5030 Other cervical disc degeneration, unspecified cervical region: Secondary | ICD-10-CM | POA: Diagnosis not present

## 2020-08-10 DIAGNOSIS — M47816 Spondylosis without myelopathy or radiculopathy, lumbar region: Secondary | ICD-10-CM | POA: Diagnosis not present

## 2020-08-10 DIAGNOSIS — M5416 Radiculopathy, lumbar region: Secondary | ICD-10-CM | POA: Diagnosis not present

## 2020-08-10 DIAGNOSIS — M48061 Spinal stenosis, lumbar region without neurogenic claudication: Secondary | ICD-10-CM | POA: Diagnosis not present

## 2020-08-10 DIAGNOSIS — M47812 Spondylosis without myelopathy or radiculopathy, cervical region: Secondary | ICD-10-CM | POA: Diagnosis not present

## 2020-08-27 ENCOUNTER — Other Ambulatory Visit: Payer: Self-pay | Admitting: Cardiovascular Disease

## 2020-09-07 ENCOUNTER — Other Ambulatory Visit: Payer: Self-pay | Admitting: Cardiovascular Disease

## 2020-10-06 ENCOUNTER — Other Ambulatory Visit: Payer: Self-pay | Admitting: Cardiovascular Disease

## 2020-10-15 ENCOUNTER — Other Ambulatory Visit: Payer: Self-pay | Admitting: Cardiovascular Disease

## 2020-10-25 DIAGNOSIS — E785 Hyperlipidemia, unspecified: Secondary | ICD-10-CM | POA: Diagnosis not present

## 2020-10-25 DIAGNOSIS — M79605 Pain in left leg: Secondary | ICD-10-CM | POA: Diagnosis not present

## 2020-10-25 DIAGNOSIS — M545 Low back pain, unspecified: Secondary | ICD-10-CM | POA: Diagnosis not present

## 2020-10-25 DIAGNOSIS — R739 Hyperglycemia, unspecified: Secondary | ICD-10-CM | POA: Diagnosis not present

## 2020-10-25 DIAGNOSIS — Z125 Encounter for screening for malignant neoplasm of prostate: Secondary | ICD-10-CM | POA: Diagnosis not present

## 2020-10-25 DIAGNOSIS — M47812 Spondylosis without myelopathy or radiculopathy, cervical region: Secondary | ICD-10-CM | POA: Diagnosis not present

## 2020-10-25 DIAGNOSIS — M79604 Pain in right leg: Secondary | ICD-10-CM | POA: Diagnosis not present

## 2020-10-25 DIAGNOSIS — I10 Essential (primary) hypertension: Secondary | ICD-10-CM | POA: Diagnosis not present

## 2020-10-27 ENCOUNTER — Other Ambulatory Visit: Payer: Self-pay

## 2020-10-27 ENCOUNTER — Ambulatory Visit: Payer: PPO | Attending: Physician Assistant

## 2020-10-27 DIAGNOSIS — M542 Cervicalgia: Secondary | ICD-10-CM | POA: Diagnosis not present

## 2020-10-27 DIAGNOSIS — M6281 Muscle weakness (generalized): Secondary | ICD-10-CM | POA: Diagnosis not present

## 2020-10-27 DIAGNOSIS — M545 Low back pain, unspecified: Secondary | ICD-10-CM | POA: Diagnosis not present

## 2020-10-27 DIAGNOSIS — G8929 Other chronic pain: Secondary | ICD-10-CM | POA: Insufficient documentation

## 2020-10-27 NOTE — Patient Instructions (Signed)
Pt was recommended to perform scapular retractions at home to promote increased use of his lower trap and mid trap muscles. Pt demonstrated and verbalized understanding.
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
## 2020-11-02 ENCOUNTER — Ambulatory Visit: Payer: PPO

## 2020-11-02 ENCOUNTER — Other Ambulatory Visit: Payer: Self-pay

## 2020-11-02 DIAGNOSIS — M6281 Muscle weakness (generalized): Secondary | ICD-10-CM

## 2020-11-02 DIAGNOSIS — M542 Cervicalgia: Secondary | ICD-10-CM | POA: Diagnosis not present

## 2020-11-02 NOTE — Patient Instructions (Signed)
Access Code: [email or web address] URL: [email or web address] Date: 11/02/2020 Prepared by: Loralyn Freshwater  Exercises Scapular Retraction with Resistance - 1 x daily - 7 x weekly - 3 sets - 10 reps
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
## 2020-11-09 ENCOUNTER — Other Ambulatory Visit: Payer: Self-pay

## 2020-11-09 ENCOUNTER — Ambulatory Visit: Payer: PPO

## 2020-11-09 DIAGNOSIS — M542 Cervicalgia: Secondary | ICD-10-CM | POA: Diagnosis not present

## 2020-11-09 DIAGNOSIS — M6281 Muscle weakness (generalized): Secondary | ICD-10-CM

## 2020-11-09 NOTE — Patient Instructions (Signed)
Access Code: [email or web address] URL: [email or web address] Date: 11/09/2020 Prepared by: Loralyn Freshwater  Exercises Scapular Retraction with Resistance - 1 x daily - 7 x weekly - 3 sets - 10 reps Seated Cervical Retraction - 1 x daily - 7 x weekly - 3 sets - 10 reps - 5 seconds hold First Rib Mobilization with Strap - 3 x daily - 7 x weekly - 1 sets - 5 reps - 15 seconds hold
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
## 2020-11-16 ENCOUNTER — Ambulatory Visit: Payer: PPO

## 2020-11-16 ENCOUNTER — Other Ambulatory Visit: Payer: Self-pay

## 2020-11-16 DIAGNOSIS — M6281 Muscle weakness (generalized): Secondary | ICD-10-CM

## 2020-11-16 DIAGNOSIS — M542 Cervicalgia: Secondary | ICD-10-CM

## 2020-11-16 DIAGNOSIS — G8929 Other chronic pain: Secondary | ICD-10-CM

## 2020-11-16 NOTE — Patient Instructions (Addendum)
Seated hip extension isometrics   Sitting on a chair,    Squeeze your rear end muscles together and GENTLY press your left foot ONLY onto the floor.     Hold for 5 seconds    Repeat 10 times   Perform 3 sets daily.      This is a corrective exercise. Once you no longer have symptoms, you can stop.      Seated shoulder extension isometrics  Sitting on a chair    Press your hands on your thighs to feel your abdominal muscles contract.   Hold for 5 seconds comfortably.   Repeat 10 times.   Perform 3 sets daily.           Access Code: [email or web address] URL: [email or web address] Date: 11/16/2020 Prepared by: Loralyn Freshwater  Exercises Scapular Retraction with Resistance - 1 x daily - 7 x weekly - 3 sets - 10 reps Seated Cervical Retraction - 1 x daily - 7 x weekly - 3 sets - 10 reps - 5 seconds hold First Rib Mobilization with Strap - 3 x daily - 7 x weekly - 1 sets - 5 reps - 15 seconds hold Standing Gluteal Sets - 1 x daily - 7 x weekly - 3 sets - 10 reps - 5 seconds hold
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
## 2020-11-21 ENCOUNTER — Other Ambulatory Visit: Payer: Self-pay | Admitting: Cardiovascular Disease

## 2020-11-22 NOTE — Telephone Encounter (Signed)
Rx request sent to pharmacy.  

## 2020-11-23 ENCOUNTER — Ambulatory Visit: Payer: PPO

## 2020-11-23 ENCOUNTER — Other Ambulatory Visit: Payer: Self-pay

## 2020-11-23 DIAGNOSIS — M6281 Muscle weakness (generalized): Secondary | ICD-10-CM

## 2020-11-23 DIAGNOSIS — G8929 Other chronic pain: Secondary | ICD-10-CM

## 2020-11-23 DIAGNOSIS — M545 Low back pain, unspecified: Secondary | ICD-10-CM

## 2020-11-23 DIAGNOSIS — M542 Cervicalgia: Secondary | ICD-10-CM | POA: Diagnosis not present
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
## 2020-11-23 NOTE — Patient Instructions (Signed)
Access Code: [email or web address] URL: [email or web address] Date: 11/23/2020 Prepared by: Loralyn Freshwater  Exercises Scapular Retraction with Resistance - 1 x daily - 7 x weekly - 3 sets - 10 reps Seated Cervical Retraction - 1 x daily - 7 x weekly - 3 sets - 10 reps - 5 seconds hold First Rib Mobilization with Strap - 3 x daily - 7 x weekly - 1 sets - 5 reps - 15 seconds hold Standing Gluteal Sets - 1 x daily - 7 x weekly - 3 sets - 10 reps - 5 seconds hold Sitting to Supine Roll - 3 x daily - 7 x weekly - 1 sets - 5 reps Supine Transversus Abdominis Bracing - Hands on Stomach - 1 x daily - 7 x weekly - 3 sets - 10 reps

## 2020-11-25 ENCOUNTER — Ambulatory Visit: Payer: PPO | Attending: Physician Assistant

## 2020-11-25 ENCOUNTER — Other Ambulatory Visit: Payer: Self-pay

## 2020-11-25 DIAGNOSIS — M545 Low back pain, unspecified: Secondary | ICD-10-CM | POA: Diagnosis not present

## 2020-11-25 DIAGNOSIS — M542 Cervicalgia: Secondary | ICD-10-CM | POA: Insufficient documentation

## 2020-11-25 DIAGNOSIS — G8929 Other chronic pain: Secondary | ICD-10-CM | POA: Insufficient documentation

## 2020-11-25 DIAGNOSIS — M6281 Muscle weakness (generalized): Secondary | ICD-10-CM | POA: Insufficient documentation
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
## 2020-12-01 ENCOUNTER — Other Ambulatory Visit: Payer: Self-pay

## 2020-12-01 ENCOUNTER — Ambulatory Visit: Payer: PPO

## 2020-12-01 ENCOUNTER — Other Ambulatory Visit: Payer: Self-pay | Admitting: Cardiovascular Disease

## 2020-12-01 DIAGNOSIS — G8929 Other chronic pain: Secondary | ICD-10-CM

## 2020-12-01 DIAGNOSIS — M542 Cervicalgia: Secondary | ICD-10-CM

## 2020-12-01 DIAGNOSIS — M6281 Muscle weakness (generalized): Secondary | ICD-10-CM

## 2020-12-01 DIAGNOSIS — M545 Low back pain, unspecified: Secondary | ICD-10-CM
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
## 2020-12-07 ENCOUNTER — Ambulatory Visit: Payer: PPO

## 2020-12-07 ENCOUNTER — Other Ambulatory Visit: Payer: Self-pay

## 2020-12-07 DIAGNOSIS — M542 Cervicalgia: Secondary | ICD-10-CM

## 2020-12-07 DIAGNOSIS — M6281 Muscle weakness (generalized): Secondary | ICD-10-CM

## 2020-12-07 DIAGNOSIS — M545 Low back pain, unspecified: Secondary | ICD-10-CM
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
## 2020-12-13 ENCOUNTER — Other Ambulatory Visit: Payer: Self-pay

## 2020-12-13 ENCOUNTER — Ambulatory Visit: Payer: PPO

## 2020-12-13 DIAGNOSIS — M542 Cervicalgia: Secondary | ICD-10-CM

## 2020-12-13 DIAGNOSIS — M6281 Muscle weakness (generalized): Secondary | ICD-10-CM

## 2020-12-13 DIAGNOSIS — M545 Low back pain, unspecified: Secondary | ICD-10-CM
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
## 2020-12-15 ENCOUNTER — Other Ambulatory Visit: Payer: Self-pay

## 2020-12-15 ENCOUNTER — Ambulatory Visit: Payer: PPO

## 2020-12-15 DIAGNOSIS — G8929 Other chronic pain: Secondary | ICD-10-CM

## 2020-12-15 DIAGNOSIS — M542 Cervicalgia: Secondary | ICD-10-CM | POA: Diagnosis not present

## 2020-12-15 DIAGNOSIS — M6281 Muscle weakness (generalized): Secondary | ICD-10-CM
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
## 2020-12-20 ENCOUNTER — Ambulatory Visit: Payer: PPO

## 2020-12-20 ENCOUNTER — Other Ambulatory Visit: Payer: Self-pay

## 2020-12-20 DIAGNOSIS — M542 Cervicalgia: Secondary | ICD-10-CM | POA: Diagnosis not present

## 2020-12-20 DIAGNOSIS — G8929 Other chronic pain: Secondary | ICD-10-CM

## 2020-12-20 DIAGNOSIS — M6281 Muscle weakness (generalized): Secondary | ICD-10-CM
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
## 2020-12-22 ENCOUNTER — Ambulatory Visit: Payer: PPO

## 2020-12-27 ENCOUNTER — Ambulatory Visit: Payer: PPO

## 2020-12-30 ENCOUNTER — Other Ambulatory Visit: Payer: Self-pay

## 2020-12-30 ENCOUNTER — Ambulatory Visit: Payer: PPO | Attending: Physician Assistant

## 2020-12-30 DIAGNOSIS — M6281 Muscle weakness (generalized): Secondary | ICD-10-CM | POA: Insufficient documentation

## 2020-12-30 DIAGNOSIS — M545 Low back pain, unspecified: Secondary | ICD-10-CM | POA: Diagnosis not present

## 2020-12-30 DIAGNOSIS — M542 Cervicalgia: Secondary | ICD-10-CM | POA: Diagnosis not present

## 2020-12-30 DIAGNOSIS — G8929 Other chronic pain: Secondary | ICD-10-CM | POA: Diagnosis not present

## 2020-12-30 NOTE — Patient Instructions (Signed)
Standing PNF chops to the R 10x5 seconds blue band for 2 sets  Reviewed and given as part of his HEP. Handout provided. Pt dmeonstrated and verbalized understanding.   Standing L lateral shift correction 10x5 seconds for 2 sets. Feels good.   Reviewed and given as part of his HEP. Pt demonstrated and verbalized understanding. Handout provided.
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
## 2021-01-02 ENCOUNTER — Other Ambulatory Visit: Payer: Self-pay | Admitting: Cardiovascular Disease

## 2021-01-03 NOTE — Telephone Encounter (Signed)
Rx request sent to pharmacy.  

## 2021-01-11 ENCOUNTER — Other Ambulatory Visit: Payer: Self-pay | Admitting: Cardiovascular Disease

## 2021-01-18 ENCOUNTER — Ambulatory Visit: Payer: PPO | Admitting: Nurse Practitioner

## 2021-01-18 ENCOUNTER — Encounter: Payer: Self-pay | Admitting: Nurse Practitioner

## 2021-01-18 ENCOUNTER — Other Ambulatory Visit: Payer: Self-pay

## 2021-01-18 VITALS — BP 148/80 | HR 80 | Ht 74.0 in | Wt 277.2 lb

## 2021-01-18 DIAGNOSIS — I25110 Atherosclerotic heart disease of native coronary artery with unstable angina pectoris: Secondary | ICD-10-CM

## 2021-01-18 DIAGNOSIS — I255 Ischemic cardiomyopathy: Secondary | ICD-10-CM | POA: Diagnosis not present

## 2021-01-18 DIAGNOSIS — I5022 Chronic systolic (congestive) heart failure: Secondary | ICD-10-CM | POA: Diagnosis not present

## 2021-01-18 DIAGNOSIS — I739 Peripheral vascular disease, unspecified: Secondary | ICD-10-CM | POA: Diagnosis not present

## 2021-01-18 DIAGNOSIS — I350 Nonrheumatic aortic (valve) stenosis: Secondary | ICD-10-CM | POA: Diagnosis not present

## 2021-01-18 DIAGNOSIS — E785 Hyperlipidemia, unspecified: Secondary | ICD-10-CM

## 2021-01-18 DIAGNOSIS — I10 Essential (primary) hypertension: Secondary | ICD-10-CM | POA: Diagnosis not present

## 2021-01-18 MED ORDER — AMLODIPINE BESYLATE 10 MG PO TABS: 10.0000 mg | ORAL_TABLET | Freq: Every day | ORAL | 1 refills | Status: DC

## 2021-01-18 NOTE — Patient Instructions (Signed)
Medication Instructions:  Your physician has recommended you make the following change in your medication:   INCREASE Amlodipine to 10 mg daily. An Rx has been sent to your pharmacy.  You can take 2 of your 5mg  Amlodipine tablets daily to complete your supply  *If you need a refill on your cardiac medications before your next appointment, please call your pharmacy*   Lab Work: None ordered If you have labs (blood work) drawn today and your tests are completely normal, you will receive your results only by: MyChart Message (if you have MyChart) OR . A paper copy in the mail If you have any lab test that is abnormal or we need to change your treatment, we will call you to review the results.   Testing/Procedures: Your physician has requested that you have a lexiscan myoview. For further information please visit [facility]. Please follow instruction sheet, as given.     Follow-Up: At [facility], you and your health needs are our priority.  As part of our continuing mission to provide you with exceptional heart care, we have created designated Provider Care Teams.  These Care Teams include your primary Cardiologist (physician) and Advanced Practice Providers (APPs -  Physician Assistants and Nurse Practitioners) who all work together to provide you with the care you need, when you need it.  We recommend signing up for the patient portal called "MyChart".  Sign up information is provided on this After Visit Summary.  MyChart is used to connect with patients for Virtual Visits (Telemedicine).  Patients are able to view lab/test results, encounter notes, upcoming appointments, etc.  Non-urgent messages can be sent to your provider as well.   To learn more about what you can do with MyChart, go to [facility].    Your next appointment:   3 month(s)  The format for your next appointment:   In Person  Provider:   [email or web address], MD   Other Instructions  [facility]  MYOVIEW  Your caregiver has ordered a Stress Test with nuclear imaging. The purpose of this test is to evaluate the blood supply to your heart muscle. This procedure is referred to as a "Non-Invasive Stress Test." This is because other than having an IV started in your vein, nothing is inserted or "invades" your body. Cardiac stress tests are done to find areas of poor blood flow to the heart by determining the extent of coronary artery disease (CAD). Some patients exercise on a treadmill, which naturally increases the blood flow to your heart, while others who are  unable to walk on a treadmill due to physical limitations have a pharmacologic/chemical stress agent called Lexiscan . This medicine will mimic walking on a treadmill by temporarily increasing your coronary blood flow.   Please note: these test may take anywhere between 2-4 hours to complete  PLEASE REPORT TO [facility] MEDICAL [facility] ENTRANCE  THE VOLUNTEERS AT THE FIRST DESK WILL DIRECT YOU WHERE TO GO  Date of Procedure:_____________________________________  Arrival Time for Procedure:______________________________     PLEASE NOTIFY THE OFFICE AT LEAST 24 HOURS IN ADVANCE IF YOU ARE UNABLE TO KEEP YOUR APPOINTMENT.  [phone] AND  PLEASE NOTIFY NUCLEAR MEDICINE AT [facility] AT LEAST 24 HOURS IN ADVANCE IF YOU ARE UNABLE TO KEEP YOUR APPOINTMENT. [phone]  How to prepare for your Myoview test:  1. Do not eat or drink after midnight 2. No caffeine for 24 hours prior to test 3. No smoking 24 hours prior to test. 4. Your medication may be  taken with water.  If your doctor stopped a medication because of this test, do not take that medication. 5. Ladies, please do not wear dresses.  Skirts or pants are appropriate. Please wear a short sleeve shirt. 6. No perfume, cologne or lotion. 7. Wear comfortable walking shoes. No heels!

## 2021-01-18 NOTE — Progress Notes (Signed)
Office Visit    Patient Name: Kurt Kelley Date of Encounter: 01/18/2021  Primary Care Provider:  Jerl Mina, MD Primary Cardiologist:  Lorine Bears, MD  Chief Complaint    75 year old male with a history of CAD status post prior RCA and circumflex stenting, mild ischemic cardiomyopathy, moderate aortic stenosis, heart failure with improved ejection fraction, hypertension, hyperlipidemia (statin intolerant), obesity, chronic low back pain, peripheral arterial disease/claudication, and sleep apnea with CPAP intolerance who presents for follow-up of dyspnea and chest discomfort.  Past Medical History    Past Medical History:  Diagnosis Date  . Aortic stenosis    a. ECHO 04/2018: EF to 50-55%, no RWMA, Gr1DD, mild to moderate aortic stenosis with mild aortic insufficiency; b. 05/2020 Echo: Mod AS.  Marland Kitchen Arthritis    lower back, right knee  . CAD S/P percutaneous coronary angioplasty 2003   a. remote PCI in 2003; b. Myoview 10/18 no ischemia, EF 39%; b. 06/2018 NSTEMI/PCI: LM nl, LAD 20p, LCX 36m (2.5x15 Resolute Onyx DES), RCA patent stent prox, 60d, EF 50-55%; c. 10/2019 MV: No isch/infarct.  Marland Kitchen HLD (hyperlipidemia)    a. intolerant to statins  . Hypertension   . Ischemic cardiomyopathy    MILD -- ECHO 04/2018: a. EF to 50-55%, no RWMA, Gr1DD, mild to moderate aortic stenosis with mild aortic insufficiency; b. 05/2020 Echo: EF 60-65%, no rwma, Gr1 DD, nl RV size/fxn, mod dil LA. Mod AS.  Marland Kitchen Myocardial infarction (HCC) 06/2018  . PAD (peripheral artery disease) (HCC)   . Sleep apnea    a. intolerant of CPAP   Past Surgical History:  Procedure Laterality Date  . CARDIAC CATHETERIZATION  2003   stent  . CARPAL TUNNEL RELEASE Right 10/24/2017   Procedure: CARPAL TUNNEL RELEASE ENDOSCOPIC;  Surgeon: Christena Flake, MD;  Location: [facility];  Service: Orthopedics;  Laterality: Right;  sleep apnea  . CERVICAL SPINE SURGERY Right 01/23/2017   arthrodesis, discectomy,  osteophytectomy, decompression  C3-C4, [facility]  . COLONOSCOPY    . CORONARY STENT INTERVENTION N/A 07/08/2018   Procedure: CORONARY STENT INTERVENTION;  Surgeon: Iran Ouch, MD;  Location: [facility];  Service: Cardiovascular;  Laterality: N/A;  . HOLEP-LASER ENUCLEATION OF THE PROSTATE WITH MORCELLATION N/A 03/12/2020   Procedure: HOLEP-LASER ENUCLEATION OF THE PROSTATE WITH MORCELLATION;  Surgeon: Sondra Come, MD;  Location: [facility];  Service: Urology;  Laterality: N/A;  . JOINT REPLACEMENT Left    tkr  . KNEE SURGERY Right 1966  . LEFT HEART CATH AND CORONARY ANGIOGRAPHY N/A 07/08/2018   Procedure: LEFT HEART CATH AND CORONARY ANGIOGRAPHY;  Surgeon: Iran Ouch, MD;  Location: [facility];  Service: Cardiovascular;  Laterality: N/A;  . REPLACEMENT TOTAL KNEE Left 2005    Allergies  Allergies  Allergen Reactions  . Brilinta [Ticagrelor] Other (See Comments)     blood for 2 weeks when voiding  . Capsicum Swelling    Paprika and black pepper both cause swelling of the lips  . Ampicillin Itching    Did it involve swelling of the face/tongue/throat, SOB, or low BP? No Did it involve sudden or severe rash/hives, skin peeling, or any reaction on the inside of your mouth or nose? No Did you need to seek medical attention at a hospital or doctor's office? No When did it last happen?10 years If all above answers are "NO", may proceed with cephalosporin use.     History of Present Illness    75 year old  male with the above complex past medical history including coronary artery disease, mild ischemic cardiomyopathy, heart failure with improved ejection fraction, moderate aortic stenosis, hypertension, hyperlipidemia, statin intolerance, obesity, chronic low back pain, peripheral arterial disease (May 2019 normal ABIs with duplex showing moderate distal SFA disease), claudication, and sleep apnea with CPAP intolerance.  He previously underwent stenting of  the right coronary artery in 2003.  Stress testing in 2018 was nonischemic.  In July 2019, he presented with chest pain and non-STEMI and was found to have a 95% stenosis in the mid left circumflex with a patent RCA stent.  The circumflex was successfully treated with a drug-eluting stent.  Echo showed moderate aortic stenosis with an EF of 50 to 55%.  He was unable to tolerate beta-blockers and given prior statin intolerance, Repatha was prescribed but he was unable to continue secondary to cost.  Most recently, he has been treated with Zetia and has tolerated.  Brilinta was previously discontinued secondary to hematuria.  In November 2020, he complained of exertional chest pain underwent stress testing which was low risk without ischemia or infarct.  He was last seen in cardiology clinic in July 2021 at which time he reported chronic, stable dyspnea on exertion.  Since then, he has noted some progression of dyspnea on exertion.  His weight is up 9 pounds and he says just overall he is less active.  He is frustrated with ongoing mask mandates and feels that his inactivity has led to deconditioning.  Since November of last year or so, he has noted more difficulty in keeping up with his wife on their daily walks.  They usually walk for 30 minutes and though he is able to complete the walk, he has to walk more slowly and notes that when he is coming up his driveway which is mostly uphill, he becomes quite winded and also occasionally experiences mild chest tightness.  He says he has had intermittent chest tightness dating back to his PCI in 2019 and is not sure if it is getting worse but certainly his exercise tolerance is dropped off.  He is not experiencing palpitations and denies PND, orthopnea, dizziness, syncope, or early satiety.  He sometimes notes swelling of his feet and attributes this to sitting pretty much all day long.  Home Medications    Prior to Admission medications   Medication Sig Start Date  End Date Taking? Authorizing Provider  albuterol (PROVENTIL HFA;VENTOLIN HFA) 108 (90 Base) MCG/ACT inhaler Inhale 2 puffs into the lungs daily.   Yes [provider]  amLODipine (NORVASC) 5 MG tablet TAKE 1 TABLET BY MOUTH EVERY DAY 01/03/21  Yes Gollan, Tollie Pizza, MD  aspirin 81 MG tablet Take 81 mg by mouth at bedtime.    Yes [provider]  ezetimibe (ZETIA) 10 MG tablet TAKE 1 TABLET BY MOUTH EVERY DAY 01/11/21  Yes Iran Ouch, MD  fenofibrate (TRICOR) 145 MG tablet TAKE 1 TABLET BY MOUTH EVERY DAY 08/04/20  Yes Iran Ouch, MD  fluticasone (FLONASE) 50 MCG/ACT nasal spray Place 1 spray into both nostrils at bedtime.  09/03/19  Yes [provider]  ibuprofen (ADVIL) 800 MG tablet Take 1 tablet by mouth daily. 07/15/20  Yes [provider]  isosorbide mononitrate (IMDUR) 30 MG 24 hr tablet TAKE 1 TABLET BY MOUTH EVERY DAY 12/01/20  Yes Iran Ouch, MD  losartan (COZAAR) 100 MG tablet TAKE 1 TABLET BY MOUTH EVERY DAY 11/22/20  Yes Gollan, Tollie Pizza, MD  methocarbamol (ROBAXIN) 750 MG tablet Take 750 mg by mouth daily as needed for muscle spasms.  01/17/20  Yes [provider]  mirabegron ER (MYRBETRIQ) 50 MG TB24 tablet Take 1 tablet (50 mg total) by mouth daily. 06/16/20  Yes Sondra Come, MD  Naphazoline HCl (CLEAR EYES OP) Place 1 drop into both eyes daily as needed (redness).   Yes [provider]  nitroGLYCERIN (NITROSTAT) 0.4 MG SL tablet PLACE 1 TABLET UNDER THE TONGUE EVERY 5 (FIVE) MINUTES AS NEEDED FOR CHEST PAIN. MAXIMUM OF 3 DOSES. 03/08/20  Yes Iran Ouch, MD    Review of Systems    Chronic dyspnea on exertion which may have worsened some.  He also has intermittent chest tightness occurring about twice a week that resolves within a few minutes of rest.  He is not sure of this is getting any worse.  Occasional lower extremity swelling.  He denies palpitations, PND, orthopnea, dizziness, syncope, or early  satiety.  All other systems reviewed and are otherwise negative except as noted above.  Physical Exam    VS:  BP (!) 148/80 (BP Location: Left Arm, Patient Position: Sitting, Cuff Size: Large)   Pulse 80   Ht 6\' 2"  (1.88 m)   Wt 277 lb 4 oz (125.8 kg)   SpO2 98%   BMI 35.60 kg/m  , BMI Body mass index is 35.6 kg/m. GEN: Obese in no acute distress. HEENT: normal. Neck: Supple, obese, difficult to gauge JVP.  No carotid bruits, or masses. Cardiac: RRR, 2/6 systolic ejection murmur loudest at the upper sternal borders were heard throughout, no rubs, or gallops. No clubbing, cyanosis, edema.  Radials/PT 2+ and equal bilaterally.  Respiratory:  Respirations regular and unlabored, clear to auscultation bilaterally. GI: Obese, soft, nontender, nondistended, BS + x 4. MS: no deformity or atrophy. Skin: warm and dry, no rash. Neuro:  Strength and sensation are intact. Psych: Normal affect.  Accessory Clinical Findings    ECG personally reviewed by me today -regular sinus rhythm, 80, baseline artifact, incomplete left bundle branch block- no acute changes.  Lab Results  Component Value Date   WBC 7.3 01/15/2020   HGB 16.7 02/04/2020   HCT 48.8 02/04/2020   MCV 82 01/15/2020   PLT 212 01/15/2020   Lab Results  Component Value Date   CREATININE 1.08 01/15/2020   BUN 18 01/15/2020   NA 139 01/15/2020   K 4.9 01/15/2020   CL 105 01/15/2020   CO2 21 01/15/2020   Lab Results  Component Value Date   ALT 40 01/15/2020   AST 19 01/15/2020   ALKPHOS 80 01/15/2020   BILITOT 0.6 01/15/2020   Lab Results  Component Value Date   CHOL 124 07/06/2018   HDL 28 (L) 07/06/2018   LDLCALC 28 07/06/2018   TRIG 341 (H) 07/06/2018   CHOLHDL 4.4 07/06/2018    Lab Results  Component Value Date   HGBA1C 7.5 (H) 07/05/2018    Assessment & Plan    1.  Coronary artery disease with unstable angina: Status post remote RCA stenting and more recent stenting of the circumflex in setting of a  non-STEMI and 2019.  He had a negative Myoview November 2020.  He notes that since his last visit in July 2021, he is had more dyspnea on exertion.  He also gets intermittent exertional chest tightness, especially when walking up inclines.  This typically resolves with rest.  Though he is clear that his dyspnea on exertion has worsened,  he is less clear as to whether or not his chest discomfort is occurring more frequently.  Echocardiogram in June of last year showed normal LV function with moderate aortic stenosis.  He is not having any presyncope or syncope.  We discussed potentially pursuing repeat stress testing and he is interested but would like to defer till after he takes care of some other doctors appointments.  Ambien increase his amlodipine to 10 mg daily in the setting of hypertension.  He otherwise remains on aspirin, long-acting nitrate, ARB, and Zetia therapy.  He is intolerant to beta-blockers and statins.  He could not afford PCSK9 inhibitors.  2.  Essential hypertension: Blood pressure 148/80 today.  He says he typically runs in the 160s and about at home.  Increase amlodipine to 10 mg daily.  Hypertensive response to activity could be contributing to his chest pain and dyspnea.  3.  Hyperlipidemia: Tolerating Zetia well.  Previously statin intolerant and cannot afford Repatha.  LDL was 81 in April 2021.  4.  Ischemic cardiomyopathy/heart failure with improved ejection fraction: EF 60 to 65% by echo in June 2021.  Appears euvolemic on examination.  He remains on ARB therapy.  No beta-blocker in the setting of prior intolerance.  Not currently requiring diuretic.  His weight is up 9 pounds since his last visit though suspect this is related to deconditioning and inactivity.  5.  Moderate aortic stenosis: Echo in June 2021 with valve area of 1.47 cm and mean gradient of 26 mmHg.  Will need follow-up echo this summer.  6.  Peripheral arterial disease and claudication: He has chronic  numbness in his legs when he walks which he attributes to chronic low back pain and pinched nerves.  He does not appear to have significant claudication symptoms.  7.  Obstructive sleep apnea: Does not tolerate CPAP.  8.  Disposition: Follow-up stress testing once he schedules.  Plan for clinic follow-up in approximately 3 months.  Nicolasa Ducking, NP 01/18/2021, 6:36 PM

## 2021-01-19 ENCOUNTER — Encounter: Payer: Self-pay | Admitting: Urology

## 2021-01-19 ENCOUNTER — Ambulatory Visit: Payer: PPO | Admitting: Urology

## 2021-01-19 VITALS — BP 165/74 | HR 92 | Ht 74.0 in | Wt 265.0 lb

## 2021-01-19 DIAGNOSIS — N3281 Overactive bladder: Secondary | ICD-10-CM

## 2021-01-19 DIAGNOSIS — N401 Enlarged prostate with lower urinary tract symptoms: Secondary | ICD-10-CM

## 2021-01-19 DIAGNOSIS — R3914 Feeling of incomplete bladder emptying: Secondary | ICD-10-CM | POA: Diagnosis not present

## 2021-01-19 LAB — BLADDER SCAN AMB NON-IMAGING

## 2021-01-19 NOTE — Patient Instructions (Signed)
Stop Myrbetriq for 1 month, ok to resume if symptoms worsen such as urinary urgency, frequency and or leakage

## 2021-01-19 NOTE — Progress Notes (Signed)
   01/19/2021 3:08 PM   [name] 21-Mar-1946 [id number]  Reason for visit: Follow up HoLEP  HPI: I saw Mr. Wilczynski back in urology clinic today in follow-up after undergoing HOLEP on 03/12/2020 for a 160 g prostate with persistent significant gross hematuria from BPH, incomplete bladder emptying, bothersome urinary symptoms, and recurrent UTIs.  130 g of tissue were removed showing only benign prostate.  Not unexpectedly, he was having severe urgency, frequency, and some urge incontinence for the first 6 weeks post-op secondary to his massive prostate and moderate to severe bladder trabeculations.    He cut back on soda and tea and this made a significant difference, he was also prescribed Myrbetriq, and he continues to take this medication daily.  IPSS score today is 1, with quality of life delighted.  PVR is normal at 0 mL.  He really denies any urinary complaints, and has nocturia 0-1 time per night.  I recommended a trial off the Myrbetriq, as he is almost a year out from his HOLEP, and I anticipate his bladder will have adjusted by now.  If he has significant worsening of his urinary symptoms off the Myrbetriq, this would be okay for him to stay on long-term.  Virtual visit for 6 weeks symptom check He would like to continue yearly follow-up  Sondra Come, MD  [facility] [address] [place], [place] [address] [phone]

## 2021-01-31 DIAGNOSIS — M4316 Spondylolisthesis, lumbar region: Secondary | ICD-10-CM | POA: Diagnosis not present

## 2021-01-31 DIAGNOSIS — Z96652 Presence of left artificial knee joint: Secondary | ICD-10-CM | POA: Diagnosis not present

## 2021-01-31 DIAGNOSIS — M5134 Other intervertebral disc degeneration, thoracic region: Secondary | ICD-10-CM | POA: Diagnosis not present

## 2021-01-31 DIAGNOSIS — M5136 Other intervertebral disc degeneration, lumbar region: Secondary | ICD-10-CM | POA: Diagnosis not present

## 2021-01-31 DIAGNOSIS — G8929 Other chronic pain: Secondary | ICD-10-CM | POA: Diagnosis not present

## 2021-01-31 DIAGNOSIS — I7090 Unspecified atherosclerosis: Secondary | ICD-10-CM | POA: Diagnosis not present

## 2021-01-31 DIAGNOSIS — M545 Low back pain, unspecified: Secondary | ICD-10-CM | POA: Diagnosis not present

## 2021-01-31 DIAGNOSIS — M5030 Other cervical disc degeneration, unspecified cervical region: Secondary | ICD-10-CM | POA: Diagnosis not present

## 2021-01-31 DIAGNOSIS — Z471 Aftercare following joint replacement surgery: Secondary | ICD-10-CM | POA: Diagnosis not present

## 2021-02-04 DIAGNOSIS — M5416 Radiculopathy, lumbar region: Secondary | ICD-10-CM | POA: Diagnosis not present

## 2021-02-15 ENCOUNTER — Other Ambulatory Visit: Payer: Self-pay

## 2021-02-15 ENCOUNTER — Telehealth (INDEPENDENT_AMBULATORY_CARE_PROVIDER_SITE_OTHER): Payer: PPO | Admitting: Urology

## 2021-02-15 DIAGNOSIS — N138 Other obstructive and reflux uropathy: Secondary | ICD-10-CM

## 2021-02-15 DIAGNOSIS — N3281 Overactive bladder: Secondary | ICD-10-CM

## 2021-02-15 DIAGNOSIS — N401 Enlarged prostate with lower urinary tract symptoms: Secondary | ICD-10-CM | POA: Diagnosis not present

## 2021-02-15 NOTE — Progress Notes (Signed)
Virtual Visit via Telephone Note  I connected with Kurt Kelley on 02/15/21 at 11:45 AM EST by telephone and verified that I am speaking with the correct person using two identifiers.   Patient location: Home Provider location: [facility]   I discussed the limitations, risks, security and privacy concerns of performing an evaluation and management service by telephone and the availability of in person appointments. We discussed the impact of the COVID-19 pandemic on the healthcare system, and the importance of social distancing and reducing patient and provider exposure. I also discussed with the patient that there may be a patient responsible charge related to this service. The patient expressed understanding and agreed to proceed.  Reason for visit: BPH/OAB  History of Present Illness: 75 year old male who underwent a HOLEP on 03/12/2020  Follow Up: RTC 1 year with PVR 160 g prostate with persistent significant gross hematuria from BPH, incomplete bladder emptying, bothersome urinary symptoms, and recurrent UTIs. 130 g of tissue were removed showing only benign prostate.  During the recovery process he was having some urgency, frequency, and urge incontinence that started to resolve around 6 weeks after surgery.  At some point he was started on Myrbetriq by different provider, and has continued to take that medication.  At our last visit in January 2022 he was doing very well with an IPSS score of 1 and PVR 0 with really no urinary complaints.  At that visit I recommended a trial off Myrbetriq to see how his urinary symptoms responded.  He has been off the Myrbetriq for 1 month and reports some mild increase in urinary frequency, but no significant urgency or urge incontinence.  We discussed risks and benefits of the medication at length, and I recommended staying off the medication and see how the bladder adjusts.  If he feels his quality of life is better on the Myrbetriq, it  is certainly fine by me to continue this medication, but I think it is worth at least a few months off the medication to see how his bladder symptoms respond.  Return precautions discussed at length.  -Discontinue Myrbetriq-okay to resume if significant worsening of urinary symptoms -RTC 1 year with IPSS/PVR(he would like to continue yearly follow-up long-term)   I discussed the assessment and treatment plan with the patient. The patient was provided an opportunity to ask questions and all were answered. The patient agreed with the plan and demonstrated an understanding of the instructions.   The patient was advised to call back or seek an in-person evaluation if the symptoms worsen or if the condition fails to improve as anticipated.  I provided 5 minutes of non-face-to-face time during this encounter.   Sondra Come, MD

## 2021-02-16 ENCOUNTER — Other Ambulatory Visit: Payer: PPO

## 2021-02-23 DIAGNOSIS — M47816 Spondylosis without myelopathy or radiculopathy, lumbar region: Secondary | ICD-10-CM | POA: Diagnosis not present

## 2021-02-23 DIAGNOSIS — M47813 Spondylosis without myelopathy or radiculopathy, cervicothoracic region: Secondary | ICD-10-CM | POA: Diagnosis not present

## 2021-02-23 DIAGNOSIS — M549 Dorsalgia, unspecified: Secondary | ICD-10-CM | POA: Diagnosis not present

## 2021-03-30 ENCOUNTER — Other Ambulatory Visit: Payer: Self-pay

## 2021-03-30 ENCOUNTER — Encounter
Admission: RE | Admit: 2021-03-30 | Discharge: 2021-03-30 | Disposition: A | Discharge status: Home / Self Care | Payer: PPO | Source: Ambulatory Visit | Attending: Cardiovascular Disease | Admitting: Cardiovascular Disease

## 2021-03-30 DIAGNOSIS — I25110 Atherosclerotic heart disease of native coronary artery with unstable angina pectoris: Secondary | ICD-10-CM

## 2021-03-30 DIAGNOSIS — E785 Hyperlipidemia, unspecified: Secondary | ICD-10-CM

## 2021-03-30 MED ORDER — TECHNETIUM TC 99M TETROFOSMIN IV KIT
31.9160 | PACK | Freq: Once | INTRAVENOUS | Status: AC | PRN
  Administered 2021-03-30: 31.916 via INTRAVENOUS

## 2021-03-30 MED ORDER — REGADENOSON 0.4 MG/5ML IV SOLN
0.4000 mg | Freq: Once | INTRAVENOUS | Status: AC
  Administered 2021-03-30: 0.4 mg via INTRAVENOUS

## 2021-03-30 MED ORDER — TECHNETIUM TC 99M TETROFOSMIN IV KIT
9.9490 | PACK | Freq: Once | INTRAVENOUS | Status: AC | PRN
  Administered 2021-03-30: 9.949 via INTRAVENOUS

## 2021-03-31 LAB — NM MYOCAR MULTI W/SPECT W/WALL MOTION / EF
LV dias vol: 192 mL (ref 62–150)
LV sys vol: 120 mL
MPHR: 147 {beats}/min
Peak HR: 127 {beats}/min
Percent HR: 86 %
Rest HR: 72 {beats}/min
SDS: 2
SRS: 7
SSS: 8
TID: 1.08

## 2021-04-04 ENCOUNTER — Other Ambulatory Visit: Payer: Self-pay | Admitting: Cardiovascular Disease

## 2021-04-19 DIAGNOSIS — R739 Hyperglycemia, unspecified: Secondary | ICD-10-CM | POA: Diagnosis not present

## 2021-04-19 DIAGNOSIS — Z125 Encounter for screening for malignant neoplasm of prostate: Secondary | ICD-10-CM | POA: Diagnosis not present

## 2021-04-19 DIAGNOSIS — I10 Essential (primary) hypertension: Secondary | ICD-10-CM | POA: Diagnosis not present

## 2021-04-19 DIAGNOSIS — E785 Hyperlipidemia, unspecified: Secondary | ICD-10-CM | POA: Diagnosis not present

## 2021-04-21 ENCOUNTER — Encounter: Payer: Self-pay | Admitting: Cardiovascular Disease

## 2021-04-21 ENCOUNTER — Other Ambulatory Visit: Payer: Self-pay

## 2021-04-21 ENCOUNTER — Ambulatory Visit: Payer: PPO | Admitting: Cardiovascular Disease

## 2021-04-21 VITALS — BP 150/70 | HR 68 | Ht 74.0 in | Wt 274.4 lb

## 2021-04-21 DIAGNOSIS — I209 Angina pectoris, unspecified: Secondary | ICD-10-CM | POA: Diagnosis not present

## 2021-04-21 DIAGNOSIS — I350 Nonrheumatic aortic (valve) stenosis: Secondary | ICD-10-CM | POA: Diagnosis not present

## 2021-04-21 DIAGNOSIS — I10 Essential (primary) hypertension: Secondary | ICD-10-CM

## 2021-04-21 DIAGNOSIS — E785 Hyperlipidemia, unspecified: Secondary | ICD-10-CM | POA: Diagnosis not present

## 2021-04-21 DIAGNOSIS — T466X5A Adverse effect of antihyperlipidemic and antiarteriosclerotic drugs, initial encounter: Secondary | ICD-10-CM

## 2021-04-21 DIAGNOSIS — I739 Peripheral vascular disease, unspecified: Secondary | ICD-10-CM

## 2021-04-21 DIAGNOSIS — I25118 Atherosclerotic heart disease of native coronary artery with other forms of angina pectoris: Secondary | ICD-10-CM | POA: Diagnosis not present

## 2021-04-21 DIAGNOSIS — I5022 Chronic systolic (congestive) heart failure: Secondary | ICD-10-CM | POA: Diagnosis not present

## 2021-04-21 DIAGNOSIS — G720 Drug-induced myopathy: Secondary | ICD-10-CM

## 2021-04-21 MED ORDER — LOSARTAN POTASSIUM-HCTZ 100-12.5 MG PO TABS: 1.0000 | ORAL_TABLET | Freq: Every day | ORAL | 2 refills | Status: DC

## 2021-04-21 NOTE — Progress Notes (Signed)
Cardiology Office Note   Date:  04/21/2021   ID:  Kurt Kelley, DOB 1946/11/19, MRN [id number]  PCP:  Kurt Mina, MD  Cardiologist:  Kurt Bears, MD   Chief Complaint  Patient presents with  . Follow-up    3 month f/u c/o chest pain, sob and weakness. Meds reviewed verbally with pt.      History of Present Illness: Kurt Kelley is a 75 y.o. male who is here today for a follow-up visit regarding coronary artery disease, chronic systolic heart failure and aortic stenosis.  The patient has known history of coronary artery disease with previous stent placement in 2003, chronic systolic heart failure due to mild ischemic cardiomyopathy, aortic stenosis, essential hypertension, hyperlipidemia, sleep apnea and obesity.  He has known history of intolerance to statins due to severe myalgia.  He also reports intolerance to CPAP due to claustrophobia.  He is aware of history of peripheral arterial disease and was seen many years ago by Dr. Orson Slick and was told about moderate 50% disease that did not require revascularization. Vascular evaluation in May 2019 showed an ABI of 1.05 on the right and 0.97 on the left.  Duplex showed mild atherosclerosis in the right lower extremity.  On the left, there was moderate disease affecting the distal SFA. The patient was hospitalized in July 2019 with non-ST elevation myocardial infarction.  Cardiac catheterization showed mild LAD disease, 95% stenosis in the mid left circumflex, patent RCA stent and 60% distal RCA stenosis.  I performed successful angioplasty and drug-eluting stent placement to the left circumflex.  There was evidence of moderate aortic stenosis with a peak gradient of 15 to 20 mmHg.  EF was 50 to 55%.  The patient did not tolerate beta-blockers.  He was started on Repatha but unfortunately was not able to continue due to cost.  He had hematuria on dual antiplatelet therapy that improved after discontinuing Brilinta. He did undergo  cystoscopy which showed marked BPH felt to be the source of hematuria.  There was no evidence of tumors.  Over the last 6 months, he has experienced progressive exertional dyspnea currently happening with minimal exertion.  In addition, he describes daily substernal chest pain and tightness mostly in the evening hours.  He has not been feeling well and he has no energy to do any activities.  He suffers from chronic back pain and has been seen by neurosurgery.  He might require surgery at some point. He underwent a Lexiscan Myoview earlier this month which showed no convincing evidence of ischemia.  EF was moderately reduced but EF calculation was likely inaccurate due to poor tracking of the LV borders.   Past Medical History:  Diagnosis Date  . Aortic stenosis    a. ECHO 04/2018: EF to 50-55%, no RWMA, Gr1DD, mild to moderate aortic stenosis with mild aortic insufficiency; b. 05/2020 Echo: Mod AS.  Marland Kitchen Arthritis    lower back, right knee  . CAD S/P percutaneous coronary angioplasty 2003   a. remote PCI in 2003; b. Myoview 10/18 no ischemia, EF 39%; b. 06/2018 NSTEMI/PCI: LM nl, LAD 20p, LCX 43m (2.5x15 Resolute Onyx DES), RCA patent stent prox, 60d, EF 50-55%; c. 10/2019 MV: No isch/infarct.  . Diabetes mellitus type II, controlled (HCC)   . HLD (hyperlipidemia)    a. intolerant to statins  . Hypertension   . Ischemic cardiomyopathy    MILD -- ECHO 04/2018: a. EF to 50-55%, no RWMA, Gr1DD, mild to moderate aortic stenosis  with mild aortic insufficiency; b. 05/2020 Echo: EF 60-65%, no rwma, Gr1 DD, nl RV size/fxn, mod dil LA. Mod AS.  Marland Kitchen Myocardial infarction (HCC) 06/2018  . PAD (peripheral artery disease) (HCC)   . Sleep apnea    a. intolerant of CPAP    Past Surgical History:  Procedure Laterality Date  . CARDIAC CATHETERIZATION  2003   stent  . CARPAL TUNNEL RELEASE Right 10/24/2017   Procedure: CARPAL TUNNEL RELEASE ENDOSCOPIC;  Surgeon: Christena Flake, MD;  Location: [facility];   Service: Orthopedics;  Laterality: Right;  sleep apnea  . CERVICAL SPINE SURGERY Right 01/23/2017   arthrodesis, discectomy, osteophytectomy, decompression  C3-C4, [facility]  . COLONOSCOPY    . CORONARY STENT INTERVENTION N/A 07/08/2018   Procedure: CORONARY STENT INTERVENTION;  Surgeon: Iran Ouch, MD;  Location: [facility];  Service: Cardiovascular;  Laterality: N/A;  . HOLEP-LASER ENUCLEATION OF THE PROSTATE WITH MORCELLATION N/A 03/12/2020   Procedure: HOLEP-LASER ENUCLEATION OF THE PROSTATE WITH MORCELLATION;  Surgeon: Sondra Come, MD;  Location: [facility] OR[facility];  Service: Urology;  Laterality: N/A;  . JOINT REPLACEMENT Left    tkr  . KNEE SURGERY Right 1966  . LEFT HEART CATH AND CORONARY ANGIOGRAPHY N/A 07/08/2018   Procedure: LEFT HEART CATH AND CORONARY ANGIOGRAPHY;  Surgeon: Iran Ouch, MD;  Location: [facility];  Service: Cardiovascular;  Laterality: N/A;  . REPLACEMENT TOTAL KNEE Left 2005     Current Outpatient Medications  Medication Sig Dispense Refill  . albuterol (PROVENTIL HFA;VENTOLIN HFA) 108 (90 Base) MCG/ACT inhaler Inhale 2 puffs into the lungs daily.    Marland Kitchen amLODipine (NORVASC) 10 MG tablet Take 1 tablet (10 mg total) by mouth daily. 90 tablet 1  . aspirin 81 MG tablet Take 81 mg by mouth at bedtime.     Marland Kitchen ezetimibe (ZETIA) 10 MG tablet TAKE 1 TABLET BY MOUTH EVERY DAY 90 tablet 0  . fenofibrate (TRICOR) 145 MG tablet TAKE 1 TABLET BY MOUTH EVERY DAY 90 tablet 2  . fluticasone (FLONASE) 50 MCG/ACT nasal spray Place 1 spray into both nostrils at bedtime.     . isosorbide mononitrate (IMDUR) 30 MG 24 hr tablet TAKE 1 TABLET BY MOUTH EVERY DAY 90 tablet 3  . losartan (COZAAR) 100 MG tablet TAKE 1 TABLET BY MOUTH EVERY DAY 90 tablet 1  . methocarbamol (ROBAXIN) 750 MG tablet Take 750 mg by mouth daily as needed for muscle spasms.     . Naphazoline HCl (CLEAR EYES OP) Place 1 drop into both eyes daily as needed (redness).    . nitroGLYCERIN  (NITROSTAT) 0.4 MG SL tablet PLACE 1 TABLET UNDER THE TONGUE EVERY 5 (FIVE) MINUTES AS NEEDED FOR CHEST PAIN. MAXIMUM OF 3 DOSES. 25 tablet 0   No current facility-administered medications for this visit.    Allergies:   Brilinta [ticagrelor], Capsicum, and Ampicillin    Social History:  The patient  reports that he quit smoking about 34 years ago. He has never used smokeless tobacco. He reports current alcohol use of about 11.0 standard drinks of alcohol per week. He reports previous drug use.   Family History:  The patient's family history includes Alzheimer's disease in his mother; CAD in his father; Diabetes in his mother; Kidney cancer in his father; Lung cancer in his father.    ROS:  Please see the history of present illness.   Otherwise, review of systems are positive for none.   All other systems are  reviewed and negative.    PHYSICAL EXAM: VS:  BP (!) 150/70 (BP Location: Left Arm, Patient Position: Sitting, Cuff Size: Large)   Pulse 68   Ht 6\' 2"  (1.88 m)   Wt 274 lb 6 oz (124.5 kg)   SpO2 98%   BMI 35.23 kg/m  , BMI Body mass index is 35.23 kg/m. GEN: Well nourished, well developed, in no acute distress  HEENT: normal  Neck: no JVD, carotid bruits, or masses Cardiac: RRR; no rubs, or gallops,no edema . 2 /6 systolic ejection murmur in the aortic area which is mid peaking with mildly diminished S2 Respiratory:  clear to auscultation bilaterally, normal work of breathing GI: soft, nontender, nondistended, + BS MS: no deformity or atrophy  Skin: warm and dry, no rash Neuro:  Strength and sensation are intact Psych: euthymic mood, full affect Radial pulses normal.   EKG:  EKG is ordered today. The EKG today demonstrates normal sinus rhythm with incomplete left bundle branch block.  Recent Labs: No results found for requested labs within last 8760 hours.    Lipid Panel    Component Value Date/Time   CHOL 124 07/06/2018 0530   TRIG 341 (H) 07/06/2018 0530   HDL  28 (L) 07/06/2018 0530   CHOLHDL 4.4 07/06/2018 0530   VLDL 68 (H) 07/06/2018 0530   LDLCALC 28 07/06/2018 0530      Wt Readings from Last 3 Encounters:  04/21/21 274 lb 6 oz (124.5 kg)  01/19/21 265 lb (120.2 kg)  01/18/21 277 lb 4 oz (125.8 kg)       PAD Screen 04/09/2018  Previous PAD dx? No  Previous surgical procedure? No  Pain with walking? Yes  Subsides with rest? No  Feet/toe relief with dangling? No  Painful, non-healing ulcers? No  Extremities discolored? Yes      ASSESSMENT AND PLAN:  1.  Coronary artery disease involving native coronary arteries with crescendo angina: The patient clearly is having progressive anginal symptoms with significant limitations and spite of medical therapy.  Due to that, I recommend proceeding with a right and left cardiac catheterization possible PCI.  This will enable 04/11/2018 to evaluate his aortic stenosis in addition to coronary artery disease.  He is also at risk of pulmonary hypertension given untreated sleep apnea.  I reviewed most recent cardiac catheterization with him which showed 60% mid RCA stenosis with possibility of progression.  I discussed the procedure in details as well as risks and benefits.  2. Peripheral arterial disease: The patient has mildly reduced ABI on the left side with evidence of borderline significant disease affecting the left SFA.   Currently with no claudications.  Continue medical therapy.  3.   moderate aortic stenosis: This will be evaluated with upcoming right and left cardiac catheterization.  4.  Chronic systolic heart failure: Currently on losartan.  Most recent EF was normal.    He did not tolerate beta-blockers.  5.  Hyperlipidemia: Intolerance to statins.  Unfortunately, he could not afford Repatha.  Continue Zetia and fenofibrate.  I reviewed most recent lipid profile done in April which showed an LDL of 108 and triglyceride of 246.  6.  Essential hypertension: Blood pressure has not been controlled  over the last year.  I elected to add small dose hydrochlorothiazide to losartan.   Disposition:   FU with me in 2 months     Signed, May, MD 04/21/21 [facility] [place], [place]

## 2021-04-21 NOTE — Patient Instructions (Signed)
Medication Instructions:  Your physician has recommended you make the following change in your medication:   STOP Losartan  START Losartan/HCTZ 100-12.5 mg daily. An Rx has been sent to your pharmacy.   *If you need a refill on your cardiac medications before your next appointment, please call your pharmacy*   Lab Work: Your physician recommends that you return for lab work (bmp, cbc) on 05/18/21.  Please have your labs drawn at the [facility] medical mall. You do not need an appt. Lab hours are Mon-Fri 7am-6pm  COVID PRE- TEST: You will need a COVID TEST prior to the procedure:  LOCATION: [facility] Pre[facility] testing office. The are located on the first floor, the office to the right of the entrance  DATE/TIME:  05/18/21  (anytime between 8 am and 1 pm)     If you have labs (blood work) drawn today and your tests are completely normal, you will receive your results only by: Marland Kitchen MyChart Message (if you have MyChart) OR . A paper copy in the mail If you have any lab test that is abnormal or we need to change your treatment, we will call you to review the results.   Testing/Procedures: Your physician has requested that you have a cardiac catheterization. Cardiac catheterization is used to diagnose and/or treat various heart conditions. Doctors may recommend this procedure for a number of different reasons. The most common reason is to evaluate chest pain. Chest pain can be a symptom of coronary artery disease (CAD), and cardiac catheterization can show whether plaque is narrowing or blocking your heart's arteries. This procedure is also used to evaluate the valves, as well as measure the blood flow and oxygen levels in different parts of your heart. For further information please visit [email or web address] Please follow instruction sheet, as given.     Follow-Up: At [facility], you and your health needs are our priority.  As part of our continuing mission to provide you with  exceptional heart care, we have created designated Provider Care Teams.  These Care Teams include your primary Cardiologist (physician) and Advanced Practice Providers (APPs -  Physician Assistants and Nurse Practitioners) who all work together to provide you with the care you need, when you need it.  We recommend signing up for the patient portal called "MyChart".  Sign up information is provided on this After Visit Summary.  MyChart is used to connect with patients for Virtual Visits (Telemedicine).  Patients are able to view lab/test results, encounter notes, upcoming appointments, etc.  Non-urgent messages can be sent to your provider as well.   To learn more about what you can do with MyChart, go to [email or web address].    Your next appointment:   2 month(s)  The format for your next appointment:   In Person  Provider:   You may see Lorine Bears, MD or one of the following Advanced Practice Providers on your designated Care Team:    Nicolasa Ducking, NP  Eula Listen, PA-C  Marisue Ivan, PA-C  Cadence Fransico Michael, [place]  Gillian Shields, NP    Other Instructions    [facility] CARDIOVASCULAR DIVISION [facility] [address] [place] [address] Dept: [phone] Loc: [phone]  Kurt Kelley  04/21/2021  You are scheduled for a Cardiac Catheterization on Friday, May 27 with Dr. Lorine Bears.  1. Please arrive at the [facility] (Main Entrance A) at [facility]: [address] [place], [place] [address] at  5:30 AM (This time is two hours before your procedure to ensure your preparation). Free valet parking service is available.   Special note: Every effort is made to have your procedure done on time. Please understand that emergencies sometimes delay scheduled procedures.  2. Diet: Do not eat solid foods after midnight.  The patient may have clear liquids until 5am upon the day of the  procedure.  3. Labs: You will need to have blood drawn on Wednesday, May 25 at [facility] Entrance, Go to 1st desk on your right to register.  Address: [address] [place], [place] [address]  Open: 8am - 5pm  Phone: [phone]. You do not need to be fasting.  COVID PRE- TEST: You will need a COVID TEST prior to the procedure:  LOCATION: [facility] Pre[facility] testing office. The are located on the first floor, the office to the right of the entrance  DATE/TIME:  05/18/21  (anytime between 8 am and 1 pm)   4. Medication instructions in preparation for your procedure:   Contrast Allergy: No    Stop taking, HTCZ (Hydrochlorothiazide) Friday, May 27,  On the morning of your procedure, take your Aspirin and any morning medicines NOT listed above.  You may use sips of water.  5. Plan for one night stay--bring personal belongings. 6. Bring a current list of your medications and current insurance cards. 7. You MUST have a responsible person to drive you home. 8. Someone MUST be with you the first 24 hours after you arrive home or your discharge will be delayed. 9. Please wear clothes that are easy to get on and off and wear slip-on shoes.  Thank you for allowing [place] to care for you!   -- [place] [facility]

## 2021-04-22 ENCOUNTER — Telehealth: Payer: Self-pay | Admitting: Cardiovascular Disease

## 2021-04-22 NOTE — Telephone Encounter (Signed)
Per avs fu 2 m from 04-21-21  Attempted to schedule .  lmov .

## 2021-04-22 NOTE — Telephone Encounter (Signed)
Scheduled

## 2021-04-26 DIAGNOSIS — I10 Essential (primary) hypertension: Secondary | ICD-10-CM | POA: Diagnosis not present

## 2021-04-26 DIAGNOSIS — Z0000 Encounter for general adult medical examination without abnormal findings: Secondary | ICD-10-CM | POA: Diagnosis not present

## 2021-04-26 DIAGNOSIS — I2510 Atherosclerotic heart disease of native coronary artery without angina pectoris: Secondary | ICD-10-CM | POA: Diagnosis not present

## 2021-04-26 DIAGNOSIS — E782 Mixed hyperlipidemia: Secondary | ICD-10-CM | POA: Diagnosis not present

## 2021-04-26 DIAGNOSIS — E119 Type 2 diabetes mellitus without complications: Secondary | ICD-10-CM | POA: Diagnosis not present

## 2021-05-12 ENCOUNTER — Telehealth: Payer: Self-pay | Admitting: Cardiovascular Disease

## 2021-05-12 NOTE — Telephone Encounter (Signed)
Secure chat received from [place] at [facility].  Harriett Sine, Can you please call this pt and let them know we rescheduled for Wednesday May 25 th at 10:30 am? Pt was on Friday May 27th. Dr. Kirke Corin will not be in the cath lab on that day. He was supposed to be cath am, however we don't need him.  Called the patient. lmtcb

## 2021-05-13 NOTE — Telephone Encounter (Signed)
Mychart msg with rescheduled cath date and time sent to the patient as well.

## 2021-05-16 NOTE — Telephone Encounter (Signed)
Noted. Called [facility] cath lab to cancel.

## 2021-05-16 NOTE — Telephone Encounter (Signed)
Patients wife calling in to cancel upcoming cath. Patient and wife both are sick (possibly covid). Patients wife states she will call back once she knows they are felling better and reschedule

## 2021-05-18 ENCOUNTER — Ambulatory Visit (HOSPITAL_COMMUNITY): Admission: RE | Admit: 2021-05-18 | Payer: PPO | Source: Home / Self Care | Admitting: Cardiovascular Disease

## 2021-05-18 ENCOUNTER — Other Ambulatory Visit: Admission: RE | Admit: 2021-05-18 | Payer: PPO | Source: Ambulatory Visit

## 2021-05-18 ENCOUNTER — Encounter (HOSPITAL_COMMUNITY): Admission: RE | Payer: Self-pay | Source: Home / Self Care

## 2021-05-18 SURGERY — RIGHT/LEFT HEART CATH AND CORONARY ANGIOGRAPHY
Anesthesia: LOCAL

## 2021-05-27 ENCOUNTER — Other Ambulatory Visit (HOSPITAL_COMMUNITY): Payer: Self-pay | Admitting: Physician Assistant

## 2021-05-27 ENCOUNTER — Other Ambulatory Visit: Payer: Self-pay | Admitting: Physician Assistant

## 2021-05-27 DIAGNOSIS — G8929 Other chronic pain: Secondary | ICD-10-CM

## 2021-05-27 DIAGNOSIS — M4807 Spinal stenosis, lumbosacral region: Secondary | ICD-10-CM

## 2021-05-27 DIAGNOSIS — M545 Low back pain, unspecified: Secondary | ICD-10-CM

## 2021-06-02 ENCOUNTER — Ambulatory Visit
Admission: RE | Admit: 2021-06-02 | Discharge: 2021-06-02 | Disposition: A | Discharge status: Home / Self Care | Payer: PPO | Source: Ambulatory Visit | Attending: Physician Assistant | Admitting: Physician Assistant

## 2021-06-02 ENCOUNTER — Other Ambulatory Visit: Payer: Self-pay

## 2021-06-02 DIAGNOSIS — G8929 Other chronic pain: Secondary | ICD-10-CM

## 2021-06-02 DIAGNOSIS — M545 Low back pain, unspecified: Secondary | ICD-10-CM | POA: Insufficient documentation

## 2021-06-02 DIAGNOSIS — M4807 Spinal stenosis, lumbosacral region: Secondary | ICD-10-CM | POA: Insufficient documentation

## 2021-06-03 ENCOUNTER — Telehealth: Payer: Self-pay

## 2021-06-03 DIAGNOSIS — I350 Nonrheumatic aortic (valve) stenosis: Secondary | ICD-10-CM

## 2021-06-03 NOTE — Telephone Encounter (Signed)
Patient wife calling to check status of cath schedule. Patient preference is [place] .

## 2021-06-03 NOTE — Telephone Encounter (Addendum)
Patient was scheduled to have a cardiac cath with Dr. Kirke Corin on 05/18/21. Patient cancelled the cath due to a family emergency. Patients wife sent a mychart msg stating that the patient is ready to reschedule.  Patient was last seen in the office on 04/21/21. He will need to be seen to update the H&P prior to rescheduling the cath.  Per Dr. Kirke Corin the patient will also need to have an echo to evaluate his AS prior to the cath.  "It has been 1 year since his most recent echocardiogram.  We should repeat his echocardiogram before cardiac catheterization to evaluate his aortic valve stenosis. "   Called the patients wife Kurt Kelley and made her aware of the above. Adv her that a scheduler will call her to schedule the office visit and the echo. Becky verbalized understanding and voiced appreciation for the call.

## 2021-06-06 DIAGNOSIS — G8929 Other chronic pain: Secondary | ICD-10-CM | POA: Diagnosis not present

## 2021-06-06 DIAGNOSIS — M5442 Lumbago with sciatica, left side: Secondary | ICD-10-CM | POA: Diagnosis not present

## 2021-06-09 ENCOUNTER — Other Ambulatory Visit: Payer: Self-pay | Admitting: Urology

## 2021-06-16 ENCOUNTER — Other Ambulatory Visit: Payer: Self-pay

## 2021-06-16 ENCOUNTER — Ambulatory Visit (INDEPENDENT_AMBULATORY_CARE_PROVIDER_SITE_OTHER): Payer: PPO

## 2021-06-16 DIAGNOSIS — I350 Nonrheumatic aortic (valve) stenosis: Secondary | ICD-10-CM

## 2021-06-16 LAB — ECHOCARDIOGRAM COMPLETE
AR max vel: 1.36 cm2
AV Area VTI: 1.68 cm2
AV Area mean vel: 1.38 cm2
AV Mean grad: 22 mmHg
AV Peak grad: 45.4 mmHg
Ao pk vel: 3.37 m/s
Area-P 1/2: 4.46 cm2
Calc EF: 51 %
Single Plane A2C EF: 50.2 %
Single Plane A4C EF: 51.4 %

## 2021-06-16 MED ORDER — PERFLUTREN LIPID MICROSPHERE
1.0000 mL | INTRAVENOUS | Status: AC | PRN
  Administered 2021-06-16: 2 mL via INTRAVENOUS

## 2021-06-23 DIAGNOSIS — G720 Drug-induced myopathy: Secondary | ICD-10-CM | POA: Insufficient documentation

## 2021-06-30 ENCOUNTER — Ambulatory Visit: Payer: PPO | Admitting: Cardiovascular Disease

## 2021-06-30 ENCOUNTER — Other Ambulatory Visit: Payer: Self-pay

## 2021-06-30 ENCOUNTER — Encounter: Payer: Self-pay | Admitting: Cardiovascular Disease

## 2021-06-30 ENCOUNTER — Ambulatory Visit: Payer: PPO | Admitting: Nurse Practitioner

## 2021-06-30 VITALS — BP 138/74 | HR 76 | Ht 74.0 in | Wt 263.0 lb

## 2021-06-30 DIAGNOSIS — I5022 Chronic systolic (congestive) heart failure: Secondary | ICD-10-CM | POA: Diagnosis not present

## 2021-06-30 DIAGNOSIS — Z01810 Encounter for preprocedural cardiovascular examination: Secondary | ICD-10-CM | POA: Diagnosis not present

## 2021-06-30 DIAGNOSIS — I10 Essential (primary) hypertension: Secondary | ICD-10-CM | POA: Diagnosis not present

## 2021-06-30 DIAGNOSIS — I25118 Atherosclerotic heart disease of native coronary artery with other forms of angina pectoris: Secondary | ICD-10-CM

## 2021-06-30 DIAGNOSIS — I359 Nonrheumatic aortic valve disorder, unspecified: Secondary | ICD-10-CM | POA: Diagnosis not present

## 2021-06-30 DIAGNOSIS — I2510 Atherosclerotic heart disease of native coronary artery without angina pectoris: Secondary | ICD-10-CM

## 2021-06-30 NOTE — Progress Notes (Signed)
Cardiology Office Note   Date:  06/30/2021   ID:  ARREN LAMINACK, DOB 1946-08-29, MRN [id number]  PCP:  Kurt Mina, MD  Cardiologist:  Kurt Bears, MD   Chief Complaint  Patient presents with   Other    Follow up post Echo. Meds reviewed verbally with patient.       History of Present Illness: Kurt Kelley is a 75 y.o. male who is here today for a follow-up visit regarding coronary artery disease, chronic systolic heart failure and aortic stenosis.  The patient has known history of coronary artery disease with previous stent placement in 2003, chronic systolic heart failure due to mild ischemic cardiomyopathy, aortic stenosis, essential hypertension, hyperlipidemia, sleep apnea and obesity.  He has known history of intolerance to statins due to severe myalgia.  He also reports intolerance to CPAP due to claustrophobia.  He is aware of history of peripheral arterial disease and was seen many years ago by Kurt Kelley and was told about moderate 50% disease that did not require revascularization. Vascular evaluation in May 2019 showed an ABI of 1.05 on the right and 0.97 on the left.  Duplex showed mild atherosclerosis in the right lower extremity.  On the left, there was moderate disease affecting the distal SFA. The patient was hospitalized in July 2019 with non-ST elevation myocardial infarction.  Cardiac catheterization showed mild LAD disease, 95% stenosis in the mid left circumflex, patent RCA stent and 60% distal RCA stenosis.  I performed successful angioplasty and drug-eluting stent placement to the left circumflex.  There was evidence of moderate aortic stenosis with a peak gradient of 15 to 20 mmHg.  EF was 50 to 55%.  The patient did not tolerate beta-blockers.  He was started on Repatha but unfortunately was not able to continue due to cost.  He had hematuria on dual antiplatelet therapy that improved after discontinuing Brilinta. He did undergo cystoscopy which showed  marked BPH felt to be the source of hematuria.  There was no evidence of tumors.  He was seen in April for worsening exertional dyspnea in the setting of uncontrolled hypertension.  He has been more deconditioned due to back issues. He underwent a Lexiscan Myoview in April which showed no convincing evidence of ischemia.   Due to his symptoms, I scheduled him for a right and left cardiac catheterization but the patient canceled due to a family emergency.  Due to uncontrolled hypertension, I added hydrochlorothiazide.  An echocardiogram was done in June which showed an EF of 50 to 55% with stable moderate aortic stenosis.  The patient reports improvement in shortness of breath and no recent chest pain since his blood pressure has been well controlled.  He continues to struggle with his back and need to have back surgery.  He has been evaluated at [facility].   Past Medical History:  Diagnosis Date   Aortic stenosis    a. ECHO 04/2018: EF to 50-55%, no RWMA, Gr1DD, mild to moderate aortic stenosis with mild aortic insufficiency; b. 05/2020 Echo: Mod AS.   Arthritis    lower back, right knee   CAD S/P percutaneous coronary angioplasty 2003   a. remote PCI in 2003; b. Myoview 10/18 no ischemia, EF 39%; b. 06/2018 NSTEMI/PCI: LM nl, LAD 20p, LCX 21m (2.5x15 Resolute Onyx DES), RCA patent stent prox, 60d, EF 50-55%; c. 10/2019 MV: No isch/infarct.   Diabetes mellitus type II, controlled (HCC)    HLD (hyperlipidemia)    a. intolerant to  statins   Hypertension    Ischemic cardiomyopathy    MILD -- ECHO 04/2018: a. EF to 50-55%, no RWMA, Gr1DD, mild to moderate aortic stenosis with mild aortic insufficiency; b. 05/2020 Echo: EF 60-65%, no rwma, Gr1 DD, nl RV size/fxn, mod dil LA. Mod AS.   Myocardial infarction (HCC) 06/2018   PAD (peripheral artery disease) (HCC)    Sleep apnea    a. intolerant of CPAP    Past Surgical History:  Procedure Laterality Date   CARDIAC CATHETERIZATION  2003   stent   CARPAL  TUNNEL RELEASE Right 10/24/2017   Procedure: CARPAL TUNNEL RELEASE ENDOSCOPIC;  Surgeon: Kurt Flake, MD;  Location: [facility];  Service: Orthopedics;  Laterality: Right;  sleep apnea   CERVICAL SPINE SURGERY Right 01/23/2017   arthrodesis, discectomy, osteophytectomy, decompression  C3-C4, [facility]   COLONOSCOPY     CORONARY STENT INTERVENTION N/A 07/08/2018   Procedure: CORONARY STENT INTERVENTION;  Surgeon: Kurt Ouch, MD;  Location: [facility];  Service: Cardiovascular;  Laterality: N/A;   HOLEP-LASER ENUCLEATION OF THE PROSTATE WITH MORCELLATION N/A 03/12/2020   Procedure: HOLEP-LASER ENUCLEATION OF THE PROSTATE WITH MORCELLATION;  Surgeon: Kurt Come, MD;  Location: [facility] ORS;  Service: Urology;  Laterality: N/A;   JOINT REPLACEMENT Left    tkr   KNEE SURGERY Right 1966   LEFT HEART CATH AND CORONARY ANGIOGRAPHY N/A 07/08/2018   Procedure: LEFT HEART CATH AND CORONARY ANGIOGRAPHY;  Surgeon: Kurt Ouch, MD;  Location: [facility];  Service: Cardiovascular;  Laterality: N/A;   REPLACEMENT TOTAL KNEE Left 2005     Current Outpatient Medications  Medication Sig Dispense Refill   acetaminophen (TYLENOL) 500 MG tablet Take 1,000 mg by mouth every 8 (eight) hours as needed for moderate pain.     albuterol (PROVENTIL HFA;VENTOLIN HFA) 108 (90 Base) MCG/ACT inhaler Inhale 2 puffs into the lungs daily.     amLODipine (NORVASC) 5 MG tablet Take 5 mg by mouth daily.     aspirin 81 MG tablet Take 81 mg by mouth at bedtime.      ezetimibe (ZETIA) 10 MG tablet TAKE 1 TABLET BY MOUTH EVERY DAY 90 tablet 0   fenofibrate (TRICOR) 145 MG tablet TAKE 1 TABLET BY MOUTH EVERY DAY 90 tablet 2   fluticasone (FLONASE) 50 MCG/ACT nasal spray Place 1 spray into both nostrils at bedtime.      isosorbide mononitrate (IMDUR) 30 MG 24 hr tablet TAKE 1 TABLET BY MOUTH EVERY DAY 90 tablet 3   losartan-hydrochlorothiazide (HYZAAR) 100-12.5 MG tablet Take 1 tablet by mouth  daily. 90 tablet 2   methocarbamol (ROBAXIN) 750 MG tablet Take 750 mg by mouth daily as needed for muscle spasms.      MYRBETRIQ 50 MG TB24 tablet TAKE 1 TABLET BY MOUTH EVERY DAY 30 tablet 11   Naphazoline HCl (CLEAR EYES OP) Place 1 drop into both eyes daily as needed (redness).     nitroGLYCERIN (NITROSTAT) 0.4 MG SL tablet PLACE 1 TABLET UNDER THE TONGUE EVERY 5 (FIVE) MINUTES AS NEEDED FOR CHEST PAIN. MAXIMUM OF 3 DOSES. 25 tablet 0   TRULICITY 0.75 MG/0.5ML SOPN Inject 0.75 mg into the skin every Monday.     amLODipine (NORVASC) 10 MG tablet Take 1 tablet (10 mg total) by mouth daily. (Patient not taking: No sig reported) 90 tablet 1   No current facility-administered medications for this visit.    Allergies:   Brilinta [ticagrelor], Capsicum, and Ampicillin  Social History:  The patient  reports that he quit smoking about 34 years ago. He has never used smokeless tobacco. He reports current alcohol use of about 11.0 standard drinks of alcohol per week. He reports previous drug use.   Family History:  The patient's family history includes Alzheimer's disease in his mother; CAD in his father; Diabetes in his mother; Kidney cancer in his father; Lung cancer in his father.    ROS:  Please see the history of present illness.   Otherwise, review of systems are positive for none.   All other systems are reviewed and negative.    PHYSICAL EXAM: VS:  BP 138/74 (BP Location: Left Arm, Patient Position: Sitting, Cuff Size: Normal)   Pulse 76   Ht 6\' 2"  (1.88 m)   Wt 263 lb (119.3 kg)   SpO2 97%   BMI 33.77 kg/m  , BMI Body mass index is 33.77 kg/m. GEN: Well nourished, well developed, in no acute distress  HEENT: normal  Neck: no JVD, carotid bruits, or masses Cardiac: RRR; no rubs, or gallops,no edema . 2 /6 systolic ejection murmur in the aortic area which is mid peaking with mildly diminished S2 Respiratory:  clear to auscultation bilaterally, normal work of breathing GI: soft,  nontender, nondistended, + BS MS: no deformity or atrophy  Skin: warm and dry, no rash Neuro:  Strength and sensation are intact Psych: euthymic mood, full affect Radial pulses normal.   EKG:  EKG is ordered today. The EKG today demonstrates normal sinus rhythm with PVCs and incomplete left bundle branch block.  Recent Labs: No results found for requested labs within last 8760 hours.    Lipid Panel    Component Value Date/Time   CHOL 124 07/06/2018 0530   TRIG 341 (H) 07/06/2018 0530   HDL 28 (L) 07/06/2018 0530   CHOLHDL 4.4 07/06/2018 0530   VLDL 68 (H) 07/06/2018 0530   LDLCALC 28 07/06/2018 0530      Wt Readings from Last 3 Encounters:  06/30/21 263 lb (119.3 kg)  04/21/21 274 lb 6 oz (124.5 kg)  01/19/21 265 lb (120.2 kg)       PAD Screen 04/09/2018  Previous PAD dx? No  Previous surgical procedure? No  Pain with walking? Yes  Subsides with rest? No  Feet/toe relief with dangling? No  Painful, non-healing ulcers? No  Extremities discolored? Yes      ASSESSMENT AND PLAN:  1.  Coronary artery disease involving native coronary arteries with stable angina: He reports improvement in his chest pain since his blood pressure has been controlled.  Thus, I think it is reasonable to continue with medical therapy for now.    2. Peripheral arterial disease: The patient has mildly reduced ABI on the left side with evidence of borderline significant disease affecting the left SFA.   Currently with no claudications.  Continue medical therapy.  He is more limited by his chronic back pain.  3.   moderate aortic stenosis: Recent echocardiogram showed stability of this.  We will plan on repeat echocardiogram in June 2023.  4.  Chronic systolic heart failure: Currently on losartan.  Most recent EF was normal.    He did not tolerate beta-blockers.  5.  Hyperlipidemia: Intolerance to statins.  Unfortunately, he could not afford Repatha.  Continue Zetia and fenofibrate.    6.   Essential hypertension: Blood pressure improved after the addition of hydrochlorothiazide.  7.  Preop cardiovascular evaluation for back surgery: Given the patient's extensive  cardiac history is he is at moderate risk from a cardiac standpoint.  His recent cardiac studies showed stable findings overall. In terms of aspirin, recommend minimizing interruption as possible.   Disposition:   FU with me in 2 months     Signed, Kurt Bears, MD 06/30/21 [facility] [place], [place] [phone]

## 2021-06-30 NOTE — Patient Instructions (Signed)
Medication Instructions:  Your physician recommends that you continue on your current medications as directed. Please refer to the Current Medication list given to you today.  *If you need a refill on your cardiac medications before your next appointment, please call your pharmacy*   Lab Work: None ordered If you have labs (blood work) drawn today and your tests are completely normal, you will receive your results only by: MyChart Message (if you have MyChart) OR A paper copy in the mail If you have any lab test that is abnormal or we need to change your treatment, we will call you to review the results.   Testing/Procedures: None ordered   Follow-Up: At [facility], you and your health needs are our priority.  As part of our continuing mission to provide you with exceptional heart care, we have created designated Provider Care Teams.  These Care Teams include your primary Cardiologist (physician) and Advanced Practice Providers (APPs -  Physician Assistants and Nurse Practitioners) who all work together to provide you with the care you need, when you need it.  We recommend signing up for the patient portal called "MyChart".  Sign up information is provided on this After Visit Summary.  MyChart is used to connect with patients for Virtual Visits (Telemedicine).  Patients are able to view lab/test results, encounter notes, upcoming appointments, etc.  Non-urgent messages can be sent to your provider as well.   To learn more about what you can do with MyChart, go to [email or web address].    Your next appointment:   2 month(s)  The format for your next appointment:   In Person  Provider:   You may see Lorine Bears, MD or one of the following Advanced Practice Providers on your designated Care Team:   Nicolasa Ducking, NP Eula Listen, PA-C Marisue Ivan, PA-C Cadence Fransico Michael, [place]   Other Instructions N/A

## 2021-07-01 ENCOUNTER — Telehealth: Payer: Self-pay | Admitting: Cardiovascular Disease

## 2021-07-01 NOTE — Telephone Encounter (Signed)
Patient wife is calling states the surgeon that was discussed in patient's appointment yesterday with DR. Kirke Corin is requesting a hold for Aspirin for 7 days not 9. Please call to discuss.

## 2021-07-01 NOTE — Telephone Encounter (Signed)
Update fwd to Dr. Arida ?

## 2021-07-05 ENCOUNTER — Other Ambulatory Visit: Payer: Self-pay | Admitting: Cardiovascular Disease

## 2021-07-08 NOTE — Telephone Encounter (Signed)
DPR on file. Detailed msg lmom for the patients wife Lurena Joiner with Dr. Jari Sportsman response and recommendation.  Pt of pt wife is to contact the office if any questions.

## 2021-07-08 NOTE — Telephone Encounter (Signed)
I forwarded my note to the surgeon.  I asked him to try to minimize interrupting aspirin as much as possible.  7 days is okay but if he can only interrupt for 5 days that would be better from the heart standpoint.

## 2021-07-15 ENCOUNTER — Other Ambulatory Visit: Payer: PPO

## 2021-07-15 ENCOUNTER — Other Ambulatory Visit: Payer: PPO | Admitting: Cardiovascular Disease

## 2021-07-20 DIAGNOSIS — E119 Type 2 diabetes mellitus without complications: Secondary | ICD-10-CM | POA: Diagnosis not present

## 2021-07-26 DIAGNOSIS — G4733 Obstructive sleep apnea (adult) (pediatric): Secondary | ICD-10-CM | POA: Diagnosis not present

## 2021-07-26 DIAGNOSIS — I2510 Atherosclerotic heart disease of native coronary artery without angina pectoris: Secondary | ICD-10-CM | POA: Diagnosis not present

## 2021-07-26 DIAGNOSIS — K219 Gastro-esophageal reflux disease without esophagitis: Secondary | ICD-10-CM | POA: Diagnosis not present

## 2021-07-26 DIAGNOSIS — I350 Nonrheumatic aortic (valve) stenosis: Secondary | ICD-10-CM | POA: Diagnosis not present

## 2021-07-26 DIAGNOSIS — I10 Essential (primary) hypertension: Secondary | ICD-10-CM | POA: Diagnosis not present

## 2021-07-26 DIAGNOSIS — J449 Chronic obstructive pulmonary disease, unspecified: Secondary | ICD-10-CM | POA: Diagnosis not present

## 2021-07-26 DIAGNOSIS — Z01818 Encounter for other preprocedural examination: Secondary | ICD-10-CM | POA: Diagnosis not present

## 2021-07-27 DIAGNOSIS — J449 Chronic obstructive pulmonary disease, unspecified: Secondary | ICD-10-CM | POA: Diagnosis not present

## 2021-07-27 DIAGNOSIS — M5442 Lumbago with sciatica, left side: Secondary | ICD-10-CM | POA: Diagnosis not present

## 2021-07-27 DIAGNOSIS — G8929 Other chronic pain: Secondary | ICD-10-CM | POA: Diagnosis not present

## 2021-07-27 DIAGNOSIS — E118 Type 2 diabetes mellitus with unspecified complications: Secondary | ICD-10-CM | POA: Diagnosis not present

## 2021-07-27 DIAGNOSIS — I10 Essential (primary) hypertension: Secondary | ICD-10-CM | POA: Diagnosis not present

## 2021-07-27 DIAGNOSIS — I2510 Atherosclerotic heart disease of native coronary artery without angina pectoris: Secondary | ICD-10-CM | POA: Diagnosis not present

## 2021-07-30 DIAGNOSIS — Z20822 Contact with and (suspected) exposure to covid-19: Secondary | ICD-10-CM | POA: Diagnosis not present

## 2021-08-02 DIAGNOSIS — I2510 Atherosclerotic heart disease of native coronary artery without angina pectoris: Secondary | ICD-10-CM | POA: Diagnosis not present

## 2021-08-02 DIAGNOSIS — I110 Hypertensive heart disease with heart failure: Secondary | ICD-10-CM | POA: Diagnosis not present

## 2021-08-02 DIAGNOSIS — M5416 Radiculopathy, lumbar region: Secondary | ICD-10-CM | POA: Diagnosis not present

## 2021-08-02 DIAGNOSIS — I5022 Chronic systolic (congestive) heart failure: Secondary | ICD-10-CM | POA: Diagnosis not present

## 2021-08-02 DIAGNOSIS — G8918 Other acute postprocedural pain: Secondary | ICD-10-CM | POA: Diagnosis not present

## 2021-08-02 DIAGNOSIS — M48061 Spinal stenosis, lumbar region without neurogenic claudication: Secondary | ICD-10-CM | POA: Diagnosis not present

## 2021-08-02 DIAGNOSIS — E782 Mixed hyperlipidemia: Secondary | ICD-10-CM | POA: Diagnosis not present

## 2021-08-02 DIAGNOSIS — Z20822 Contact with and (suspected) exposure to covid-19: Secondary | ICD-10-CM | POA: Diagnosis not present

## 2021-08-02 DIAGNOSIS — E119 Type 2 diabetes mellitus without complications: Secondary | ICD-10-CM | POA: Diagnosis not present

## 2021-08-02 DIAGNOSIS — K219 Gastro-esophageal reflux disease without esophagitis: Secondary | ICD-10-CM | POA: Diagnosis not present

## 2021-08-02 DIAGNOSIS — Z91018 Allergy to other foods: Secondary | ICD-10-CM | POA: Diagnosis not present

## 2021-08-02 DIAGNOSIS — Z7982 Long term (current) use of aspirin: Secondary | ICD-10-CM | POA: Diagnosis not present

## 2021-08-02 DIAGNOSIS — I352 Nonrheumatic aortic (valve) stenosis with insufficiency: Secondary | ICD-10-CM | POA: Diagnosis not present

## 2021-08-02 DIAGNOSIS — I252 Old myocardial infarction: Secondary | ICD-10-CM | POA: Diagnosis not present

## 2021-08-02 DIAGNOSIS — M4316 Spondylolisthesis, lumbar region: Secondary | ICD-10-CM | POA: Diagnosis not present

## 2021-08-02 DIAGNOSIS — Z79899 Other long term (current) drug therapy: Secondary | ICD-10-CM | POA: Diagnosis not present

## 2021-08-02 DIAGNOSIS — Z955 Presence of coronary angioplasty implant and graft: Secondary | ICD-10-CM | POA: Diagnosis not present

## 2021-08-02 DIAGNOSIS — I255 Ischemic cardiomyopathy: Secondary | ICD-10-CM | POA: Diagnosis not present

## 2021-08-02 DIAGNOSIS — Z87891 Personal history of nicotine dependence: Secondary | ICD-10-CM | POA: Diagnosis not present

## 2021-08-02 DIAGNOSIS — R140 Abdominal distension (gaseous): Secondary | ICD-10-CM | POA: Diagnosis not present

## 2021-08-02 DIAGNOSIS — Z880 Allergy status to penicillin: Secondary | ICD-10-CM | POA: Diagnosis not present

## 2021-08-23 DIAGNOSIS — Z4802 Encounter for removal of sutures: Secondary | ICD-10-CM | POA: Diagnosis not present

## 2021-08-23 DIAGNOSIS — Z4889 Encounter for other specified surgical aftercare: Secondary | ICD-10-CM | POA: Diagnosis not present

## 2021-09-09 ENCOUNTER — Other Ambulatory Visit: Payer: Self-pay

## 2021-09-09 ENCOUNTER — Ambulatory Visit: Payer: PPO | Admitting: Cardiovascular Disease

## 2021-09-09 ENCOUNTER — Encounter: Payer: Self-pay | Admitting: Cardiovascular Disease

## 2021-09-09 VITALS — BP 128/70 | HR 82 | Ht 74.0 in | Wt 252.2 lb

## 2021-09-09 DIAGNOSIS — I739 Peripheral vascular disease, unspecified: Secondary | ICD-10-CM

## 2021-09-09 DIAGNOSIS — I5022 Chronic systolic (congestive) heart failure: Secondary | ICD-10-CM | POA: Diagnosis not present

## 2021-09-09 DIAGNOSIS — I25118 Atherosclerotic heart disease of native coronary artery with other forms of angina pectoris: Secondary | ICD-10-CM | POA: Diagnosis not present

## 2021-09-09 DIAGNOSIS — E785 Hyperlipidemia, unspecified: Secondary | ICD-10-CM | POA: Diagnosis not present

## 2021-09-09 DIAGNOSIS — I10 Essential (primary) hypertension: Secondary | ICD-10-CM | POA: Diagnosis not present

## 2021-09-09 DIAGNOSIS — I350 Nonrheumatic aortic (valve) stenosis: Secondary | ICD-10-CM

## 2021-09-09 NOTE — Progress Notes (Signed)
Cardiology Office Note   Date:  09/09/2021   ID:  Kurt Kelley, DOB 08-May-1946, MRN [id number]  PCP:  Jerl Mina, MD  Cardiologist:  Lorine Bears, MD   Chief Complaint  Patient presents with   Other    2 month f/u c/o gout with Losartan HCTZ pt d/c. Meds reviewed verbally with pt.      History of Present Illness: Kurt Kelley is a 75 y.o. male who is here today for a follow-up visit regarding coronary artery disease, chronic systolic heart failure and aortic stenosis.  The patient has known history of coronary artery disease with previous stent placement in 2003, chronic systolic heart failure due to mild ischemic cardiomyopathy, aortic stenosis, essential hypertension, hyperlipidemia, sleep apnea and obesity.  He has known history of intolerance to statins due to severe myalgia.  He also reports intolerance to CPAP due to claustrophobia.  He is aware of history of peripheral arterial disease and was seen many years ago by Dr. Orson Slick and was told about moderate 50% disease that did not require revascularization. Vascular evaluation in May 2019 showed an ABI of 1.05 on the right and 0.97 on the left.  Duplex showed mild atherosclerosis in the right lower extremity.  On the left, there was moderate disease affecting the distal SFA. The patient was hospitalized in July 2019 with non-ST elevation myocardial infarction.  Cardiac catheterization showed mild LAD disease, 95% stenosis in the mid left circumflex, patent RCA stent and 60% distal RCA stenosis.  I performed successful angioplasty and drug-eluting stent placement to the left circumflex.  There was evidence of moderate aortic stenosis with a peak gradient of 15 to 20 mmHg.  EF was 50 to 55%.  The patient did not tolerate beta-blockers.  He was started on Repatha but unfortunately was not able to continue due to cost.  He had hematuria on dual antiplatelet therapy that improved after discontinuing Brilinta. He did undergo  cystoscopy which showed marked BPH felt to be the source of hematuria.  There was no evidence of tumors.  He was seen in April for worsening exertional dyspnea in the setting of uncontrolled hypertension.  He has been more deconditioned due to back issues. Hydrochlorothiazide was added for blood pressure control.  The patient underwent spine surgery last month at [facility] with improvement in symptoms.  He stopped taking hydrochlorothiazide due to development of gout.  He lost 22 pounds since April and overall feels better.  No chest pain or worsening dyspnea.  Past Medical History:  Diagnosis Date   Aortic stenosis    a. ECHO 04/2018: EF to 50-55%, no RWMA, Gr1DD, mild to moderate aortic stenosis with mild aortic insufficiency; b. 05/2020 Echo: Mod AS.   Arthritis    lower back, right knee   CAD S/P percutaneous coronary angioplasty 2003   a. remote PCI in 2003; b. Myoview 10/18 no ischemia, EF 39%; b. 06/2018 NSTEMI/PCI: LM nl, LAD 20p, LCX 41m (2.5x15 Resolute Onyx DES), RCA patent stent prox, 60d, EF 50-55%; c. 10/2019 MV: No isch/infarct.   Diabetes mellitus type II, controlled (HCC)    HLD (hyperlipidemia)    a. intolerant to statins   Hypertension    Ischemic cardiomyopathy    MILD -- ECHO 04/2018: a. EF to 50-55%, no RWMA, Gr1DD, mild to moderate aortic stenosis with mild aortic insufficiency; b. 05/2020 Echo: EF 60-65%, no rwma, Gr1 DD, nl RV size/fxn, mod dil LA. Mod AS.   Myocardial infarction (HCC) 06/2018  PAD (peripheral artery disease) (HCC)    Sleep apnea    a. intolerant of CPAP    Past Surgical History:  Procedure Laterality Date   ARTHRODESIS POSTERIOR INTERBODY LUMBAR       CARDIAC CATHETERIZATION  2003   stent   CARPAL TUNNEL RELEASE Right 10/24/2017   Procedure: CARPAL TUNNEL RELEASE ENDOSCOPIC;  Surgeon: Christena Flake, MD;  Location: [facility];  Service: Orthopedics;  Laterality: Right;  sleep apnea   CERVICAL SPINE SURGERY Right 01/23/2017   arthrodesis,  discectomy, osteophytectomy, decompression  C3-C4, [facility]   COLONOSCOPY     CORONARY STENT INTERVENTION N/A 07/08/2018   Procedure: CORONARY STENT INTERVENTION;  Surgeon: Iran Ouch, MD;  Location: [facility];  Service: Cardiovascular;  Laterality: N/A;   HOLEP-LASER ENUCLEATION OF THE PROSTATE WITH MORCELLATION N/A 03/12/2020   Procedure: HOLEP-LASER ENUCLEATION OF THE PROSTATE WITH MORCELLATION;  Surgeon: Sondra Come, MD;  Location: [facility] OR[facility];  Service: Urology;  Laterality: N/A;   JOINT REPLACEMENT Left    tkr   KNEE SURGERY Right 1966   LEFT HEART CATH AND CORONARY ANGIOGRAPHY N/A 07/08/2018   Procedure: LEFT HEART CATH AND CORONARY ANGIOGRAPHY;  Surgeon: Iran Ouch, MD;  Location: [facility];  Service: Cardiovascular;  Laterality: N/A;   REPLACEMENT TOTAL KNEE Left 2005     Current Outpatient Medications  Medication Sig Dispense Refill   acetaminophen (TYLENOL) 500 MG tablet Take 1,000 mg by mouth every 8 (eight) hours as needed for moderate pain.     albuterol (PROVENTIL HFA;VENTOLIN HFA) 108 (90 Base) MCG/ACT inhaler Inhale 2 puffs into the lungs daily.     amLODipine (NORVASC) 5 MG tablet TAKE 1 TABLET BY MOUTH EVERY DAY 90 tablet 0   aspirin 81 MG tablet Take 81 mg by mouth at bedtime.      ezetimibe (ZETIA) 10 MG tablet TAKE 1 TABLET BY MOUTH EVERY DAY 90 tablet 0   fenofibrate (TRICOR) 145 MG tablet TAKE 1 TABLET BY MOUTH EVERY DAY 90 tablet 2   fluticasone (FLONASE) 50 MCG/ACT nasal spray Place 1 spray into both nostrils at bedtime.      isosorbide mononitrate (IMDUR) 30 MG 24 hr tablet TAKE 1 TABLET BY MOUTH EVERY DAY 90 tablet 3   losartan (COZAAR) 100 MG tablet Take 100 mg by mouth daily.     methocarbamol (ROBAXIN) 750 MG tablet Take 750 mg by mouth daily as needed for muscle spasms.      MYRBETRIQ 50 MG TB24 tablet TAKE 1 TABLET BY MOUTH EVERY DAY 30 tablet 11   Naphazoline HCl (CLEAR EYES OP) Place 1 drop into both eyes daily as needed  (redness).     nitroGLYCERIN (NITROSTAT) 0.4 MG SL tablet PLACE 1 TABLET UNDER THE TONGUE EVERY 5 (FIVE) MINUTES AS NEEDED FOR CHEST PAIN. MAXIMUM OF 3 DOSES. 25 tablet 0   TRULICITY 0.75 MG/0.5ML SOPN Inject 0.75 mg into the skin every Monday.     No current facility-administered medications for this visit.    Allergies:   Brilinta [ticagrelor], Capsicum, and Ampicillin    Social History:  The patient  reports that he quit smoking about 34 years ago. He has never used smokeless tobacco. He reports current alcohol use of about 11.0 standard drinks per week. He reports that he does not currently use drugs.   Family History:  The patient's family history includes Alzheimer's disease in his mother; CAD in his father; Diabetes in his mother; Kidney cancer in  his father; Lung cancer in his father.    ROS:  Please see the history of present illness.   Otherwise, review of systems are positive for none.   All other systems are reviewed and negative.    PHYSICAL EXAM: VS:  BP 128/70 (BP Location: Left Arm, Patient Position: Sitting, Cuff Size: Normal)   Pulse 82   Ht 6\' 2"  (1.88 m)   Wt 252 lb 4 oz (114.4 kg)   SpO2 98%   BMI 32.39 kg/m  , BMI Body mass index is 32.39 kg/m. GEN: Well nourished, well developed, in no acute distress  HEENT: normal  Neck: no JVD, carotid bruits, or masses Cardiac: RRR; no rubs, or gallops,no edema . 2 /6 systolic ejection murmur in the aortic area which is mid peaking with mildly diminished S2 Respiratory:  clear to auscultation bilaterally, normal work of breathing GI: soft, nontender, nondistended, + BS MS: no deformity or atrophy  Skin: warm and dry, no rash Neuro:  Strength and sensation are intact Psych: euthymic mood, full affect Radial pulses normal.   EKG:  EKG is ordered today. The EKG today demonstrates normal sinus rhythm with incomplete left bundle branch block.  Recent Labs: No results found for requested labs within last 8760 hours.     Lipid Panel    Component Value Date/Time   CHOL 124 07/06/2018 0530   TRIG 341 (H) 07/06/2018 0530   HDL 28 (L) 07/06/2018 0530   CHOLHDL 4.4 07/06/2018 0530   VLDL 68 (H) 07/06/2018 0530   LDLCALC 28 07/06/2018 0530      Wt Readings from Last 3 Encounters:  09/09/21 252 lb 4 oz (114.4 kg)  06/30/21 263 lb (119.3 kg)  04/21/21 274 lb 6 oz (124.5 kg)       PAD Screen 04/09/2018  Previous PAD dx? No  Previous surgical procedure? No  Pain with walking? Yes  Subsides with rest? No  Feet/toe relief with dangling? No  Painful, non-healing ulcers? No  Extremities discolored? Yes      ASSESSMENT AND PLAN:  1.  Coronary artery disease involving native coronary arteries with stable angina: He reports stable symptoms overall.  Thus, we will continue medical therapy.  2. Peripheral arterial disease: The patient has mildly reduced ABI on the left side with evidence of borderline significant disease affecting the left SFA.  He reports improvement in claudication since he lost weight.  3.   Moderate aortic stenosis: Recommend repeat echocardiogram in June 2023.   4.  Chronic systolic heart failure: Currently on losartan.  Most recent EF was normal.    He did not tolerate beta-blockers.  5.  Hyperlipidemia: Intolerance to statins.  Unfortunately, he could not afford Repatha.  Continue Zetia and fenofibrate.    6.  Essential hypertension: Hydrochlorothiazide was stopped due to gout.  His blood pressure is currently controlled with amlodipine, Imdur and losartan.    Disposition:   FU with me in 6 months     Signed, July 2023, MD 09/09/21 [facility] [place], [place]

## 2021-09-09 NOTE — Patient Instructions (Signed)

## 2021-09-17 ENCOUNTER — Other Ambulatory Visit: Payer: Self-pay | Admitting: Cardiovascular Disease

## 2021-09-19 DIAGNOSIS — M4316 Spondylolisthesis, lumbar region: Secondary | ICD-10-CM | POA: Diagnosis not present

## 2021-09-19 DIAGNOSIS — M5033 Other cervical disc degeneration, cervicothoracic region: Secondary | ICD-10-CM | POA: Diagnosis not present

## 2021-09-19 DIAGNOSIS — M5136 Other intervertebral disc degeneration, lumbar region: Secondary | ICD-10-CM | POA: Diagnosis not present

## 2021-09-19 DIAGNOSIS — M5030 Other cervical disc degeneration, unspecified cervical region: Secondary | ICD-10-CM | POA: Diagnosis not present

## 2021-09-19 DIAGNOSIS — Z981 Arthrodesis status: Secondary | ICD-10-CM | POA: Diagnosis not present

## 2021-09-19 DIAGNOSIS — M5135 Other intervertebral disc degeneration, thoracolumbar region: Secondary | ICD-10-CM | POA: Diagnosis not present

## 2021-09-19 DIAGNOSIS — M549 Dorsalgia, unspecified: Secondary | ICD-10-CM | POA: Diagnosis not present

## 2021-09-19 DIAGNOSIS — M5134 Other intervertebral disc degeneration, thoracic region: Secondary | ICD-10-CM | POA: Diagnosis not present

## 2021-09-19 DIAGNOSIS — Z96652 Presence of left artificial knee joint: Secondary | ICD-10-CM | POA: Diagnosis not present

## 2021-09-19 DIAGNOSIS — M4326 Fusion of spine, lumbar region: Secondary | ICD-10-CM | POA: Diagnosis not present

## 2021-09-19 DIAGNOSIS — M1711 Unilateral primary osteoarthritis, right knee: Secondary | ICD-10-CM | POA: Diagnosis not present

## 2021-09-19 DIAGNOSIS — I998 Other disorder of circulatory system: Secondary | ICD-10-CM | POA: Diagnosis not present

## 2021-10-10 ENCOUNTER — Other Ambulatory Visit: Payer: Self-pay | Admitting: Cardiovascular Disease

## 2021-11-07 DIAGNOSIS — M4326 Fusion of spine, lumbar region: Secondary | ICD-10-CM | POA: Diagnosis not present

## 2021-12-22 ENCOUNTER — Other Ambulatory Visit: Payer: Self-pay | Admitting: Cardiovascular Disease

## 2021-12-26 ENCOUNTER — Other Ambulatory Visit: Payer: Self-pay | Admitting: Cardiovascular Disease

## 2022-01-19 ENCOUNTER — Other Ambulatory Visit: Payer: Self-pay | Admitting: Cardiovascular Disease

## 2022-01-24 ENCOUNTER — Other Ambulatory Visit: Payer: Self-pay | Admitting: Cardiovascular Disease

## 2022-01-24 ENCOUNTER — Encounter: Payer: Self-pay | Admitting: Cardiovascular Disease

## 2022-01-24 MED ORDER — LOSARTAN POTASSIUM 100 MG PO TABS: 100.0000 mg | ORAL_TABLET | Freq: Every day | ORAL | 1 refills | Status: DC

## 2022-02-20 DIAGNOSIS — M5030 Other cervical disc degeneration, unspecified cervical region: Secondary | ICD-10-CM | POA: Diagnosis not present

## 2022-02-20 DIAGNOSIS — M1711 Unilateral primary osteoarthritis, right knee: Secondary | ICD-10-CM | POA: Diagnosis not present

## 2022-02-20 DIAGNOSIS — M4316 Spondylolisthesis, lumbar region: Secondary | ICD-10-CM | POA: Diagnosis not present

## 2022-02-20 DIAGNOSIS — M5134 Other intervertebral disc degeneration, thoracic region: Secondary | ICD-10-CM | POA: Diagnosis not present

## 2022-02-20 DIAGNOSIS — M5136 Other intervertebral disc degeneration, lumbar region: Secondary | ICD-10-CM | POA: Diagnosis not present

## 2022-02-20 DIAGNOSIS — Z981 Arthrodesis status: Secondary | ICD-10-CM | POA: Diagnosis not present

## 2022-02-20 DIAGNOSIS — Z96652 Presence of left artificial knee joint: Secondary | ICD-10-CM | POA: Diagnosis not present

## 2022-02-21 DIAGNOSIS — E042 Nontoxic multinodular goiter: Secondary | ICD-10-CM | POA: Diagnosis not present

## 2022-02-23 ENCOUNTER — Ambulatory Visit: Payer: PPO | Admitting: Urology

## 2022-03-14 ENCOUNTER — Other Ambulatory Visit: Payer: Self-pay

## 2022-03-14 ENCOUNTER — Ambulatory Visit: Payer: PPO | Admitting: Cardiovascular Disease

## 2022-03-14 ENCOUNTER — Encounter: Payer: Self-pay | Admitting: Cardiovascular Disease

## 2022-03-14 VITALS — BP 160/80 | HR 76 | Ht 75.0 in | Wt 261.2 lb

## 2022-03-14 DIAGNOSIS — I25118 Atherosclerotic heart disease of native coronary artery with other forms of angina pectoris: Secondary | ICD-10-CM

## 2022-03-14 DIAGNOSIS — I350 Nonrheumatic aortic (valve) stenosis: Secondary | ICD-10-CM | POA: Diagnosis not present

## 2022-03-14 DIAGNOSIS — I5022 Chronic systolic (congestive) heart failure: Secondary | ICD-10-CM

## 2022-03-14 DIAGNOSIS — I739 Peripheral vascular disease, unspecified: Secondary | ICD-10-CM | POA: Diagnosis not present

## 2022-03-14 DIAGNOSIS — E785 Hyperlipidemia, unspecified: Secondary | ICD-10-CM | POA: Diagnosis not present

## 2022-03-14 DIAGNOSIS — I10 Essential (primary) hypertension: Secondary | ICD-10-CM | POA: Diagnosis not present

## 2022-03-14 MED ORDER — SPIRONOLACTONE 25 MG PO TABS: 25.0000 mg | ORAL_TABLET | Freq: Every day | ORAL | 5 refills | Status: DC

## 2022-03-14 NOTE — Progress Notes (Signed)
?  ?Cardiology Office Note ? ? ?Date:  03/14/2022  ? ?ID:  Kurt Kelley, DOB 03-14-1946, MRN [id number] ? ?PCP:  Jerl Mina, MD  ?Cardiologist:  Lorine Bears, MD  ? ?Chief Complaint  ?Patient presents with  ? Other  ?  6 month f/u c/o fluid retention; weight gain/sob; Pt would like to discuss finding @ [facility] finding for Atherosclerotic vas. Calcifications of AA. Meds reviewed verbally with pt.  ? ? ?  ?History of Present Illness: ?Kurt Kelley is a 76 y.o. male who is here today for a follow-up visit regarding coronary artery disease, chronic systolic heart failure and aortic stenosis. ? The patient has known history of coronary artery disease with previous stent placement in 2003, chronic systolic heart failure due to mild ischemic cardiomyopathy, aortic stenosis, essential hypertension, hyperlipidemia, sleep apnea and obesity.  He has known history of intolerance to statins due to severe myalgia.  He also reports intolerance to CPAP due to claustrophobia.  He is aware of history of peripheral arterial disease and was seen many years ago by Dr. Orson Slick and was told about moderate 50% disease that did not require revascularization. ?Vascular evaluation in May 2019 showed an ABI of 1.05 on the right and 0.97 on the left.  Duplex showed mild atherosclerosis in the right lower extremity.  On the left, there was moderate disease affecting the distal SFA. ?The patient was hospitalized in July 2019 with non-ST elevation myocardial infarction.  Cardiac catheterization showed mild LAD disease, 95% stenosis in the mid left circumflex, patent RCA stent and 60% distal RCA stenosis.  I performed successful angioplasty and drug-eluting stent placement to the left circumflex.  There was evidence of moderate aortic stenosis with a peak gradient of 15 to 20 mmHg.  EF was 50 to 55%.  The patient did not tolerate beta-blockers.  ?He was started on Repatha but unfortunately was not able to continue due to cost. ? ?He had  hematuria on dual antiplatelet therapy that improved after discontinuing Brilinta. He did undergo cystoscopy which showed marked BPH felt to be the source of hematuria.  There was no evidence of tumors. ? ?He was seen in April for worsening exertional dyspnea in the setting of uncontrolled hypertension.  He has been more deconditioned due to back issues. ?Hydrochlorothiazide was added for blood pressure control. ? ?The patient underwent spine surgery last year at [facility] with improvement in symptoms.  Hydrochlorothiazide was discontinued due to gout.  His blood pressure has been elevated lately and he has been having mild chest discomfort with overexertion.  He feels palpitations with symptoms of chest pain and burning in throat.  Symptoms are overall mild.  He checks his blood pressure at home and his blood pressure has been elevated. ? ?Past Medical History:  ?Diagnosis Date  ? Aortic stenosis   ? a. ECHO 04/2018: EF to 50-55%, no RWMA, Gr1DD, mild to moderate aortic stenosis with mild aortic insufficiency; b. 05/2020 Echo: Mod AS.  ? Arthritis   ? lower back, right knee  ? CAD S/P percutaneous coronary angioplasty 2003  ? a. remote PCI in 2003; b. Myoview 10/18 no ischemia, EF 39%; b. 06/2018 NSTEMI/PCI: LM nl, LAD 20p, LCX 54m (2.5x15 Resolute Onyx DES), RCA patent stent prox, 60d, EF 50-55%; c. 10/2019 MV: No isch/infarct.  ? Diabetes mellitus type II, controlled (HCC)   ? HLD (hyperlipidemia)   ? a. intolerant to statins  ? Hypertension   ? Ischemic cardiomyopathy   ? MILD --  ECHO 04/2018: a. EF to 50-55%, no RWMA, Gr1DD, mild to moderate aortic stenosis with mild aortic insufficiency; b. 05/2020 Echo: EF 60-65%, no rwma, Gr1 DD, nl RV size/fxn, mod dil LA. Mod AS.  ? Myocardial infarction (HCC) 06/2018  ? PAD (peripheral artery disease) (HCC)   ? Sleep apnea   ? a. intolerant of CPAP  ? ? ?Past Surgical History:  ?Procedure Laterality Date  ? ARTHRODESIS POSTERIOR INTERBODY LUMBAR      ? CARDIAC CATHETERIZATION  2003   ? stent  ? CARPAL TUNNEL RELEASE Right 10/24/2017  ? Procedure: CARPAL TUNNEL RELEASE ENDOSCOPIC;  Surgeon: Christena Flake, MD;  Location: [facility];  Service: Orthopedics;  Laterality: Right;  sleep apnea  ? CERVICAL SPINE SURGERY Right 01/23/2017  ? arthrodesis, discectomy, osteophytectomy, decompression  C3-C4, [facility]  ? COLONOSCOPY    ? CORONARY STENT INTERVENTION N/A 07/08/2018  ? Procedure: CORONARY STENT INTERVENTION;  Surgeon: Iran Ouch, MD;  Location: [facility];  Service: Cardiovascular;  Laterality: N/A;  ? HOLEP-LASER ENUCLEATION OF THE PROSTATE WITH MORCELLATION N/A 03/12/2020  ? Procedure: HOLEP-LASER ENUCLEATION OF THE PROSTATE WITH MORCELLATION;  Surgeon: Sondra Come, MD;  Location: [facility];  Service: Urology;  Laterality: N/A;  ? JOINT REPLACEMENT Left   ? tkr  ? KNEE SURGERY Right 1966  ? LEFT HEART CATH AND CORONARY ANGIOGRAPHY N/A 07/08/2018  ? Procedure: LEFT HEART CATH AND CORONARY ANGIOGRAPHY;  Surgeon: Iran Ouch, MD;  Location: [facility];  Service: Cardiovascular;  Laterality: N/A;  ? REPLACEMENT TOTAL KNEE Left 2005  ? ? ? ?Current Outpatient Medications  ?Medication Sig Dispense Refill  ? acetaminophen (TYLENOL) 500 MG tablet Take 1,000 mg by mouth every 8 (eight) hours as needed for moderate pain.    ? albuterol (PROVENTIL HFA;VENTOLIN HFA) 108 (90 Base) MCG/ACT inhaler Inhale 2 puffs into the lungs daily.    ? amLODipine (NORVASC) 5 MG tablet TAKE 1 TABLET BY MOUTH EVERY DAY 90 tablet 2  ? aspirin 81 MG tablet Take 81 mg by mouth at bedtime.     ? ezetimibe (ZETIA) 10 MG tablet TAKE 1 TABLET BY MOUTH EVERY DAY 90 tablet 0  ? fenofibrate (TRICOR) 145 MG tablet TAKE 1 TABLET BY MOUTH EVERY DAY 90 tablet 1  ? fluticasone (FLONASE) 50 MCG/ACT nasal spray Place 1 spray into both nostrils at bedtime.     ? isosorbide mononitrate (IMDUR) 30 MG 24 hr tablet TAKE 1 TABLET BY MOUTH EVERY DAY 90 tablet 2  ? losartan (COZAAR) 100 MG tablet Take 1  tablet (100 mg total) by mouth daily. 90 tablet 1  ? methocarbamol (ROBAXIN) 750 MG tablet Take 750 mg by mouth daily as needed for muscle spasms.     ? Naphazoline HCl (CLEAR EYES OP) Place 1 drop into both eyes daily as needed (redness).    ? nitroGLYCERIN (NITROSTAT) 0.4 MG SL tablet PLACE 1 TABLET UNDER THE TONGUE EVERY 5 (FIVE) MINUTES AS NEEDED FOR CHEST PAIN. MAXIMUM OF 3 DOSES. 25 tablet 0  ? TRULICITY 0.75 MG/0.5ML SOPN Inject 0.75 mg into the skin every Monday.    ? ?No current facility-administered medications for this visit.  ? ? ?Allergies:   Brilinta [ticagrelor], Capsicum, and Ampicillin  ? ? ?Social History:  The patient  reports that he quit smoking about 35 years ago. He has never used smokeless tobacco. He reports current alcohol use of about 11.0 standard drinks per week. He reports that he does  not currently use drugs.  ? ?Family History:  The patient's family history includes Alzheimer's disease in his mother; CAD in his father; Diabetes in his mother; Kidney cancer in his father; Lung cancer in his father.  ? ? ?ROS:  Please see the history of present illness.   Otherwise, review of systems are positive for none.   All other systems are reviewed and negative.  ? ? ?PHYSICAL EXAM: ?VS:  BP (!) 160/80 (BP Location: Left Arm, Patient Position: Sitting, Cuff Size: Large)   Pulse 76   Ht 6\' 3"  (1.905 m)   Wt 261 lb 4 oz (118.5 kg)   SpO2 98%   BMI 32.65 kg/m?  , BMI Body mass index is 32.65 kg/m?. ?GEN: Well nourished, well developed, in no acute distress  ?HEENT: normal  ?Neck: no JVD, carotid bruits, or masses ?Cardiac: RRR; no rubs, or gallops,no edema . 2 /6 systolic ejection murmur in the aortic area which is mid peaking with mildly diminished S2 ?Respiratory:  clear to auscultation bilaterally, normal work of breathing ?GI: soft, nontender, nondistended, + BS ?MS: no deformity or atrophy  ?Skin: warm and dry, no rash ?Neuro:  Strength and sensation are intact ?Psych: euthymic mood, full  affect ?Radial pulses normal. ? ? ?EKG:  EKG is ordered today. ?The EKG today demonstrates normal sinus rhythm with incomplete left bundle branch block. ? ?Recent Labs: ?No results found for requested labs within

## 2022-03-14 NOTE — Patient Instructions (Addendum)
Medication Instructions:  ?Your physician has recommended you make the following change in your medication:  ? ?START Spironolactone 25 mg daily. An Rx has been sent to your pharmacy. ? ? ?*If you need a refill on your cardiac medications before your next appointment, please call your pharmacy* ? ? ?Lab Work: ?Your physician recommends that you return for lab work (bmp) in: 1 week  ? ?If you have labs (blood work) drawn today and your tests are completely normal, you will receive your results only by: ?MyChart Message (if you have MyChart) OR ?A paper copy in the mail ?If you have any lab test that is abnormal or we need to change your treatment, we will call you to review the results. ? ? ?Testing/Procedures: ?Your physician has requested that you have an echocardiogram. Echocardiography is a painless test that uses sound waves to create images of your heart. It provides your doctor with information about the size and shape of your heart and how well your heart?s chambers and valves are working. This procedure takes approximately one hour. There are no restrictions for this procedure. ?(To be scheduled in June 2023) ? ? ?Follow-Up: ?At [facility], you and your health needs are our priority.  As part of our continuing mission to provide you with exceptional heart care, we have created designated Provider Care Teams.  These Care Teams include your primary Cardiologist (physician) and Advanced Practice Providers (APPs -  Physician Assistants and Nurse Practitioners) who all work together to provide you with the care you need, when you need it. ? ?We recommend signing up for the patient portal called "MyChart".  Sign up information is provided on this After Visit Summary.  MyChart is used to connect with patients for Virtual Visits (Telemedicine).  Patients are able to view lab/test results, encounter notes, upcoming appointments, etc.  Non-urgent messages can be sent to your provider as well.   ?To learn more  about what you can do with MyChart, go to [email or web address].   ? ?Your next appointment:   ?6 month(s) ? ?The format for your next appointment:   ?In Person ? ?Provider:   ?You may see Lorine Bears, MD or one of the following Advanced Practice Providers on your designated Care Team:   ?Nicolasa Ducking, NP ?Eula Listen, PA-C ?Cadence Fransico Michael, PA-C ? ?Other Instructions ?N/A ? ?

## 2022-03-21 ENCOUNTER — Other Ambulatory Visit (INDEPENDENT_AMBULATORY_CARE_PROVIDER_SITE_OTHER): Payer: PPO

## 2022-03-21 ENCOUNTER — Other Ambulatory Visit: Payer: Self-pay

## 2022-03-21 DIAGNOSIS — I5022 Chronic systolic (congestive) heart failure: Secondary | ICD-10-CM | POA: Diagnosis not present

## 2022-03-22 LAB — BASIC METABOLIC PANEL
BUN/Creatinine Ratio: 15 (ref 10–24)
BUN: 16 mg/dL (ref 8–27)
CO2: 21 mmol/L (ref 20–29)
Calcium: 10.1 mg/dL (ref 8.6–10.2)
Chloride: 106 mmol/L (ref 96–106)
Creatinine, Ser: 1.08 mg/dL (ref 0.76–1.27)
Glucose: 97 mg/dL (ref 70–99)
Potassium: 4.3 mmol/L (ref 3.5–5.2)
Sodium: 141 mmol/L (ref 134–144)
eGFR: 72 mL/min/{1.73_m2} (ref >59)

## 2022-03-23 DIAGNOSIS — E042 Nontoxic multinodular goiter: Secondary | ICD-10-CM | POA: Diagnosis not present

## 2022-03-28 DIAGNOSIS — E118 Type 2 diabetes mellitus with unspecified complications: Secondary | ICD-10-CM | POA: Diagnosis not present

## 2022-03-29 ENCOUNTER — Ambulatory Visit: Payer: PPO | Admitting: Urology

## 2022-04-04 DIAGNOSIS — M109 Gout, unspecified: Secondary | ICD-10-CM | POA: Diagnosis not present

## 2022-04-04 DIAGNOSIS — R600 Localized edema: Secondary | ICD-10-CM | POA: Diagnosis not present

## 2022-04-04 DIAGNOSIS — E118 Type 2 diabetes mellitus with unspecified complications: Secondary | ICD-10-CM | POA: Diagnosis not present

## 2022-04-04 DIAGNOSIS — J449 Chronic obstructive pulmonary disease, unspecified: Secondary | ICD-10-CM | POA: Diagnosis not present

## 2022-04-04 DIAGNOSIS — I10 Essential (primary) hypertension: Secondary | ICD-10-CM | POA: Diagnosis not present

## 2022-04-06 ENCOUNTER — Ambulatory Visit: Payer: PPO | Admitting: Urology

## 2022-04-15 ENCOUNTER — Other Ambulatory Visit: Payer: Self-pay | Admitting: Cardiovascular Disease

## 2022-05-03 ENCOUNTER — Encounter: Payer: Self-pay | Admitting: Urology

## 2022-05-03 ENCOUNTER — Ambulatory Visit: Payer: PPO | Admitting: Urology

## 2022-05-03 VITALS — BP 157/69 | HR 40 | Ht 75.0 in | Wt 252.0 lb

## 2022-05-03 DIAGNOSIS — N3281 Overactive bladder: Secondary | ICD-10-CM | POA: Diagnosis not present

## 2022-05-03 DIAGNOSIS — N401 Enlarged prostate with lower urinary tract symptoms: Secondary | ICD-10-CM

## 2022-05-03 DIAGNOSIS — N138 Other obstructive and reflux uropathy: Secondary | ICD-10-CM | POA: Diagnosis not present

## 2022-05-03 LAB — BLADDER SCAN AMB NON-IMAGING

## 2022-05-03 MED ORDER — MIRABEGRON ER 50 MG PO TB24: 50.0000 mg | ORAL_TABLET | Freq: Every day | ORAL | 11 refills | Status: DC | PRN

## 2022-05-03 NOTE — Progress Notes (Signed)
? ?  05/03/2022 ?9:40 AM  ? ?Kurt Kelley ?May 06, 1946 ?[id number] ? ?Reason for visit: Follow up HoLEP ? ?HPI: ?I saw Mr. Luhn back in urology clinic today in follow-up after undergoing HOLEP on 03/12/2020 for a 160 g prostate with persistent significant gross hematuria from BPH, incomplete bladder emptying, bothersome urinary symptoms, and recurrent UTIs.  130 g of tissue were removed showing only benign prostate.  He was initially on 50 mg Myrbetriq daily for some persistent OAB symptoms postoperatively, but those have since improved significantly.  At her last visit we had discussed discontinuing the Myrbetriq, and he had been off of that.  He was recently started on spironolactone, and reports some increase in urgency and some postvoid dribbling on this medication.  He is taking the Myrbetriq really just as needed when he uses the spironolactone or is traveling. He denies any recurrent gross hematuria.  He feels he is emptying with a strong stream and denies any urgency or incontinence.  PVR is normal at 0 mL.  He would like to continue yearly follow-up. ? ?RTC 1 year PVR ?Myrbetriq refilled, can take as needed when traveling or using spironolactone ? ?Sondra Come, MD ? ?[facility] ?[address], [address] ?[place], [place] [address] ?[phone] ? ? ?

## 2022-05-30 ENCOUNTER — Ambulatory Visit (INDEPENDENT_AMBULATORY_CARE_PROVIDER_SITE_OTHER): Payer: PPO

## 2022-05-30 DIAGNOSIS — I350 Nonrheumatic aortic (valve) stenosis: Secondary | ICD-10-CM | POA: Diagnosis not present

## 2022-05-30 LAB — ECHOCARDIOGRAM COMPLETE
AR max vel: 1.3 cm2
AV Area VTI: 1.67 cm2
AV Area mean vel: 1.55 cm2
AV Mean grad: 22 mmHg
AV Peak grad: 42.8 mmHg
Ao pk vel: 3.27 m/s
Area-P 1/2: 4.26 cm2
Calc EF: 50.1 %
S' Lateral: 5.3 cm
Single Plane A2C EF: 47.3 %
Single Plane A4C EF: 55.8 %

## 2022-06-02 ENCOUNTER — Telehealth: Payer: Self-pay

## 2022-06-02 DIAGNOSIS — I25118 Atherosclerotic heart disease of native coronary artery with other forms of angina pectoris: Secondary | ICD-10-CM

## 2022-06-02 DIAGNOSIS — I5022 Chronic systolic (congestive) heart failure: Secondary | ICD-10-CM

## 2022-06-02 MED ORDER — SACUBITRIL-VALSARTAN 97-103 MG PO TABS: 1.0000 | ORAL_TABLET | Freq: Two times a day (BID) | ORAL | 3 refills | Status: DC

## 2022-06-02 NOTE — Telephone Encounter (Signed)
Called and spoke with patient and his wife. Discussed the result note as charted below. Both verbalized understanding and agreed with plan. Patient has been scheduled with Nicolasa Ducking on 06/15/22.

## 2022-06-02 NOTE — Telephone Encounter (Signed)
-----   Message from Iran Ouch, MD sent at 06/01/2022  2:54 PM EDT ----- Inform patient that echo showed a drop in ejection fraction to 40 to 45% with stable moderate aortic stenosis. Given decreased ejection fraction, recommend switching losartan to Entresto 97/103 mg twice daily.  Check basic metabolic profile in 1 week and schedule a follow-up appointment within the next 2 weeks as he might require repeat cardiac catheterization to see why his systolic function decreased.

## 2022-06-04 ENCOUNTER — Encounter: Payer: Self-pay | Admitting: Cardiovascular Disease

## 2022-06-12 ENCOUNTER — Other Ambulatory Visit
Admission: RE | Admit: 2022-06-12 | Discharge: 2022-06-12 | Disposition: A | Discharge status: Home / Self Care | Payer: PPO | Attending: Cardiovascular Disease | Admitting: Cardiovascular Disease

## 2022-06-12 DIAGNOSIS — I5022 Chronic systolic (congestive) heart failure: Secondary | ICD-10-CM | POA: Diagnosis not present

## 2022-06-12 LAB — BASIC METABOLIC PANEL
Anion gap: 6 (ref 5–15)
BUN: 18 mg/dL (ref 8–23)
CO2: 23 mmol/L (ref 22–32)
Calcium: 10 mg/dL (ref 8.9–10.3)
Chloride: 109 mmol/L (ref 98–111)
Creatinine, Ser: 0.89 mg/dL (ref 0.61–1.24)
GFR, Estimated: >60 mL/min (ref >60)
Glucose, Bld: 140 mg/dL — ABNORMAL HIGH (ref 70–99)
Potassium: 4.2 mmol/L (ref 3.5–5.1)
Sodium: 138 mmol/L (ref 135–145)

## 2022-06-13 ENCOUNTER — Encounter: Payer: Self-pay | Admitting: Cardiovascular Disease

## 2022-06-15 ENCOUNTER — Other Ambulatory Visit
Admission: RE | Admit: 2022-06-15 | Discharge: 2022-06-15 | Disposition: A | Discharge status: Home / Self Care | Payer: PPO | Source: Ambulatory Visit | Attending: Nurse Practitioner | Admitting: Nurse Practitioner

## 2022-06-15 ENCOUNTER — Ambulatory Visit: Payer: PPO | Admitting: Cardiology

## 2022-06-15 ENCOUNTER — Encounter: Payer: Self-pay | Admitting: Nurse Practitioner

## 2022-06-15 VITALS — BP 115/67 | HR 75 | Ht 74.0 in | Wt 261.2 lb

## 2022-06-15 DIAGNOSIS — I255 Ischemic cardiomyopathy: Secondary | ICD-10-CM

## 2022-06-15 DIAGNOSIS — E785 Hyperlipidemia, unspecified: Secondary | ICD-10-CM

## 2022-06-15 DIAGNOSIS — I25118 Atherosclerotic heart disease of native coronary artery with other forms of angina pectoris: Secondary | ICD-10-CM | POA: Diagnosis not present

## 2022-06-15 DIAGNOSIS — I10 Essential (primary) hypertension: Secondary | ICD-10-CM | POA: Diagnosis not present

## 2022-06-15 DIAGNOSIS — I5022 Chronic systolic (congestive) heart failure: Secondary | ICD-10-CM

## 2022-06-15 DIAGNOSIS — I739 Peripheral vascular disease, unspecified: Secondary | ICD-10-CM | POA: Diagnosis not present

## 2022-06-15 DIAGNOSIS — I350 Nonrheumatic aortic (valve) stenosis: Secondary | ICD-10-CM | POA: Diagnosis not present

## 2022-06-15 LAB — CBC
HCT: 53.3 % — ABNORMAL HIGH (ref 39.0–52.0)
Hemoglobin: 17.5 g/dL — ABNORMAL HIGH (ref 13.0–17.0)
MCH: 28.1 pg (ref 26.0–34.0)
MCHC: 32.8 g/dL (ref 30.0–36.0)
MCV: 85.7 fL (ref 80.0–100.0)
Platelets: 255 10*3/uL (ref 150–400)
RBC: 6.22 MIL/uL — ABNORMAL HIGH (ref 4.22–5.81)
RDW: 14.2 % (ref 11.5–15.5)
WBC: 8.7 10*3/uL (ref 4.0–10.5)
nRBC: 0 % (ref 0.0–0.2)

## 2022-06-15 LAB — BASIC METABOLIC PANEL
Anion gap: 6 (ref 5–15)
BUN: 19 mg/dL (ref 8–23)
CO2: 24 mmol/L (ref 22–32)
Calcium: 10.2 mg/dL (ref 8.9–10.3)
Chloride: 111 mmol/L (ref 98–111)
Creatinine, Ser: 1.23 mg/dL (ref 0.61–1.24)
GFR, Estimated: >60 mL/min (ref >60)
Glucose, Bld: 92 mg/dL (ref 70–99)
Potassium: 4.6 mmol/L (ref 3.5–5.1)
Sodium: 141 mmol/L (ref 135–145)

## 2022-06-15 MED ORDER — SODIUM CHLORIDE 0.9% FLUSH: 3.0000 mL | Freq: Two times a day (BID) | INTRAVENOUS | Status: DC

## 2022-06-15 NOTE — Patient Instructions (Addendum)
Medication Instructions:  Your physician has recommended you make the following change in your medication:   STOP Amlodipine  *If you need a refill on your cardiac medications before your next appointment, please call your pharmacy*   Lab Work: CBC & BMET today over at the Medical [facility] Entrance at [facility] ([address]) then go to 1st desk on the right to check in (REGISTRATION)    Lab hours: Monday- Friday (7:30 am- 5:30 pm)  If you have labs (blood work) drawn today and your tests are completely normal, you will receive your results only by: MyChart Message (if you have MyChart) OR A paper copy in the mail If you have any lab test that is abnormal or we need to change your treatment, we will call you to review the results.   Testing/Procedures: Your physician has requested that you have a limited echocardiogram in 3 months. Echocardiography is a painless test that uses sound waves to create images of your heart. It provides your doctor with information about the size and shape of your heart and how well your heart's chambers and valves are working. This procedure takes approximately one hour. There are no restrictions for this procedure.  [facility] Cardiac Cath Instructions  You are scheduled for a Cardiac Cath on: Monday July 03, 2022  Please arrive at 08:30 am on the day of your procedure Please expect a call from our [facility] to pre-register you Do not eat/drink anything after midnight Someone will need to drive you home It is recommended someone be with you for the first 24 hours after your procedure Wear clothes that are easy to get on/off and wear slip on shoes if possible   Medications bring a current list of all medications with you  _XX__ You may take all of your medications the morning of your procedure with enough water to swallow safely   Day of your procedure: Arrive at the Medical Mall entrance.  Free valet service is available.  After entering the Medical Mall  please check-in at the registration desk (1st desk on your right) to receive your armband. After receiving your armband someone will escort you to the cardiac cath/special procedures waiting area.  The usual length of stay after your procedure is about 2 to 3 hours.  This can vary.  If you have any questions, please call our office at [phone], or you may call the cardiac cath lab at [facility] directly at [phone]    Follow-Up: At [facility], you and your health needs are our priority.  As part of our continuing mission to provide you with exceptional heart care, we have created designated Provider Care Teams.  These Care Teams include your primary Cardiologist (physician) and Advanced Practice Providers (APPs -  Physician Assistants and Nurse Practitioners) who all work together to provide you with the care you need, when you need it.   Your next appointment:   2 week(s) post procedure   The format for your next appointment:   In Person  Provider:   Lorine Bears, MD or Nicolasa Ducking, NP          Important Information About Sugar

## 2022-06-15 NOTE — Progress Notes (Signed)
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  Past Medical History    Past Medical History:  Diagnosis Date   Aortic stenosis    a. 04/2018 Echo: mild to mod AS w/ mild AI; b. 05/2020 Echo: Mod AS; c. 05/2021 Echo: Mod AS; d. 05/2022 Echo: EF 40-45%, Mod AS (AVA 1.67 cm^2 - VTI, mean grad 77mmHg).   Arthritis    lower back, right knee   CAD S/P percutaneous coronary angioplasty 2003   a. remote PCI in 2003; b. Myoview 10/18 no ischemia, EF 39%; b. 06/2018 NSTEMI/PCI: LM nl, LAD 20p, LCX 55m (2.5x15 Resolute Onyx DES), RCA patent stent prox, 60d, EF 50-55%; c. 10/2019 MV: No isch/infarct; d. 03/2021 MV: EF 30-44%, no ischemia.   Chronic combined systolic (congestive) and diastolic (congestive) heart failure ([place])    a. 04/2018 Echo: EF to 50-55%, Gr1DD; b. 05/2020 Echo: EF 60-65%, Gr1 DD; c. 05/2021 Echo: EF 50-55%, GrI DD; d. 05/2022 Echo: EF 40-45%, inf/septal HK, GrI DD.   Diabetes mellitus type II, controlled ([place])    HLD (hyperlipidemia)    a. intolerant to statins   Hypertension    Ischemic cardiomyopathy    a. 04/2018 Echo: EF to 50-55%, no RWMA, Gr1DD, mild to moderate aortic stenosis with mild aortic insufficiency; b. 05/2020 Echo: EF 60-65%, no rwma, Gr1 DD, nl RV size/fxn, mod dil LA. Mod AS; c. 05/2021 Echo: EF 50-55%, GrI DD; d. 05/2022 Echo: EF 40-45%, inf/septal HK, GrI DD, RVSP  37.35mmHg, mod dil LA, mild MR, mild AI, mod AS.   Myocardial infarction ([place]) 06/2018   PAD (peripheral artery disease) ([facility])    Sleep apnea    a. intolerant of CPAP   Past Surgical History:  Procedure Laterality Date   ARTHRODESIS POSTERIOR INTERBODY LUMBAR       CARDIAC CATHETERIZATION  2003   stent   CARPAL TUNNEL RELEASE Right 10/24/2017   Procedure: CARPAL TUNNEL RELEASE ENDOSCOPIC;  Surgeon: Corky Mull, MD;  Location: [facility];  Service: Orthopedics;  Laterality: Right;  sleep apnea   CERVICAL SPINE SURGERY Right 01/23/2017   arthrodesis, discectomy, osteophytectomy, decompression  C3-C4, [facility]   COLONOSCOPY     CORONARY STENT INTERVENTION N/A 07/08/2018   Procedure: CORONARY STENT INTERVENTION;  Surgeon: [name], MD;  Location: [facility];  Service: Cardiovascular;  Laterality: N/A;   HOLEP-LASER ENUCLEATION OF THE PROSTATE WITH MORCELLATION N/A 03/12/2020   Procedure: HOLEP-LASER ENUCLEATION OF THE PROSTATE WITH MORCELLATION;  Surgeon: Billey Co, MD;  Location: [facility];  Service: Urology;  Laterality: N/A;   JOINT REPLACEMENT Left    tkr   KNEE SURGERY Right 1966   LEFT HEART CATH AND CORONARY ANGIOGRAPHY N/A 07/08/2018   Procedure: LEFT HEART CATH AND CORONARY ANGIOGRAPHY;  Surgeon: [name], MD;  Location: [place] [facility];  Service: Cardiovascular;  Laterality: N/A;   REPLACEMENT TOTAL KNEE Left 2005    Allergies  Allergies  Allergen Reactions  Brilinta [Ticagrelor] Other (See Comments)     blood for 2 weeks when voiding   Capsicum Swelling    Paprika and black pepper both cause swelling of the lips   Ampicillin Itching    Did it involve swelling of the face/tongue/throat, SOB, or low BP? No Did it involve sudden or severe rash/hives, skin peeling, or any reaction on the inside of your mouth or nose? No Did you need to seek medical attention at a hospital or doctor's office? No When did it last happen?      10  years If all above answers are "NO", may proceed with cephalosporin use.     History of Present Illness    76 year old male with above complex past medical history including coronary artery disease, mild ischemic cardiomyopathy, heart failure with reduced ejection fraction, moderate aortic stenosis, essential hypertension, hyperlipidemia, statin intolerance, obesity, chronic low back pain, peripheral arterial disease with claudication, and sleep apnea with CPAP intolerance.  He had previously undergone stenting of the right coronary artery in 2003.  Stress testing in 2018 was nonischemic.  In July 2019, he presented with chest pain and NSTEMI and was found to have 95% stenosis to the mid left circumflex with a patent RCA stent.  The circumflex was successfully treated with a drug-eluting stent.  Echocardiogram showed moderate aortic stenosis with an EF of 50 to 55%.  He was unable to tolerate beta-blockers and given his prior statin intolerance he was started on Repatha.  Unfortunately he was unable to continue with that therapy due to cost.  He was subsequently started on Zetia which she has tolerated since that time.  He also unfortunately has been unable to stay on dual antiplatelet therapy with Brilinta being discontinued secondary to hematuria.  In November 2020 he had complaints of exertional chest discomfort underwent stress testing which was low risk without ischemia or infarct.  He had complaints of dyspnea and chest discomfort in 1 of 2022 and underwent stress testing which revealed no evidence of ischemia left ventricular ejection fraction was moderate with moderately reduced 30 to 44% correlation with echocardiogram was advised and there was an indeterminate risk due to reduced EF.  Repeat echocardiogram was completed in 05/2021 which revealed EF of 50 to 55% grade 1 diastolic dysfunction, moderate aortic stenosis and mild mitral regurg.  Evaluation in 06/2021 was discussed patient is scheduled for  appointment cardiac catheterization with the patient had to cancel the procedure due to a family emergency.  At that time due to his uncontrolled hypertension he had HCTZ added to his daily medication regimen.  On his return visit in 09/13/2021 he had undergone spine surgery at [facility] with improvement in his systems but had to stop taking the HCTZ due to the development of gout he had also had a weight loss of 22 pounds and he currently denies chest pain or worsening dyspnea.  3/23 continued with some elevated blood pressures mild chest discomfort with overexertion, fluid retention, weight gain, and shortness of breath, and occasional palpitations.  He underwent another echocardiogram in 05/2022 which revealed LV EF of 40 to 45% which was a dropped mildly decreased function, grade 1 diastolic dysfunction, mildly elevated pulmonary artery systolic pressure, mild mitral regurgitation, aortic valve is normal in structure with severe calcification on the aortic valve, moderate aortic valve stenosis and mild aortic regurgitation.  Patient in today with continued complaints of exertional dyspnea and chest discomfort.  He states that his shortness of breath is worse on walking  in his driveway that is at a graded incline and his chest discomfort is the same heaviness that has not changed in frequency or intensity. He also states that during the evening when he lies in the bed he feels like his chest is full of fluid and he wakes several times during the night with a congested cough that has been productive.  He also has some dizziness complaints today but was found not to be orthostatic.  Of note he was recently started on Entresto 97/103 mg bid.  He and his wife both state the patient is a very med compliant.  They do have concerns and questions today about the drop in his EF was found on recent echocardiogram.    Home Medications    Current Outpatient Medications  Medication Sig Dispense Refill   acetaminophen  (TYLENOL) 500 MG tablet Take 1,000 mg by mouth every 8 (eight) hours as needed for moderate pain.     albuterol (PROVENTIL HFA;VENTOLIN HFA) 108 (90 Base) MCG/ACT inhaler Inhale 2 puffs into the lungs daily.     aspirin 81 MG tablet Take 81 mg by mouth at bedtime.      ezetimibe (ZETIA) 10 MG tablet TAKE 1 TABLET BY MOUTH EVERY DAY 90 tablet 0   fenofibrate (TRICOR) 145 MG tablet TAKE 1 TABLET BY MOUTH EVERY DAY 90 tablet 1   fluticasone (FLONASE) 50 MCG/ACT nasal spray Place 1 spray into both nostrils at bedtime.      isosorbide mononitrate (IMDUR) 30 MG 24 hr tablet TAKE 1 TABLET BY MOUTH EVERY DAY 90 tablet 2   methocarbamol (ROBAXIN) 500 MG tablet Take 500 mg by mouth daily as needed for muscle spasms.     mirabegron ER (MYRBETRIQ) 50 MG TB24 tablet Take 1 tablet (50 mg total) by mouth daily as needed (overactive bladder symptoms/leakage). 30 tablet 11   Naphazoline HCl (CLEAR EYES OP) Place 1 drop into both eyes daily as needed (redness).     nitroGLYCERIN (NITROSTAT) 0.4 MG SL tablet PLACE 1 TABLET UNDER THE TONGUE EVERY 5 (FIVE) MINUTES AS NEEDED FOR CHEST PAIN. MAXIMUM OF 3 DOSES. 25 tablet 0   sacubitril-valsartan (ENTRESTO) 97-103 MG Take 1 tablet by mouth 2 (two) times daily. 60 tablet 3   TRULICITY 0.75 MG/0.5ML SOPN Inject 0.75 mg into the skin every Monday.     spironolactone (ALDACTONE) 25 MG tablet Take 1 tablet (25 mg total) by mouth daily. 30 tablet 5   Current Facility-Administered Medications  Medication Dose Route Frequency Provider Last Rate Last Admin   sodium chloride flush (NS) 0.9 % injection 3 mL  3 mL Intravenous Q12H Gaberial Cada, NP         Family History    Family History  Problem Relation Age of Onset   CAD Father    Lung cancer Father    Kidney cancer Father    Diabetes Mother    Alzheimer's disease Mother    He indicated that the status of his mother is unknown. He indicated that the status of his father is unknown.  Social History    Social  History   Socioeconomic History   Marital status: Married    Spouse name: Becky   Number of children: Not on file   Years of education: Not on file   Highest education level: Not on file  Occupational History   Not on file  Tobacco Use   Smoking status: Former    Types: Cigarettes    Quit date: 1988  Years since quitting: 35.4   Smokeless tobacco: Never  Vaping Use   Vaping Use: Never used  Substance and Sexual Activity   Alcohol use: Yes    Alcohol/week: 11.0 standard drinks of alcohol    Types: 10 Cans of beer, 1 Shots of liquor per week   Drug use: Not Currently   Sexual activity: Yes    Birth control/protection: None  Other Topics Concern   Not on file  Social History Narrative   Not on file   Social Determinants of Health   Financial Resource Strain: Not on file  Food Insecurity: Not on file  Transportation Needs: Not on file  Physical Activity: Not on file  Stress: Not on file  Social Connections: Not on file  Intimate Partner Violence: Not on file     Review of Systems    General:  No chills, fever, night sweats or weight changes.  Cardiovascular:  Endorses chest pain, dyspnea on exertion, edema, but denies orthopnea, palpitations, paroxysmal nocturnal dyspnea. Dermatological: No rash, lesions/masses Respiratory: Endorses nocturnal cough, and chronic dyspnea Urologic: No hematuria, dysuria Abdominal:   No nausea, vomiting, diarrhea, bright red blood per rectum, melena, or hematemesis Neurologic:  No visual changes, wkns, changes in mental status. All other systems reviewed and are otherwise negative except as noted above.     Physical Exam    VS:  BP 115/67 (BP Location: Left Arm, Patient Position: Sitting, Cuff Size: Normal)   Pulse 75   Ht 6\' 2"  (1.88 m)   Wt 261 lb 3.2 oz (118.5 kg)   SpO2 96%   BMI 33.54 kg/m  , BMI Body mass index is 33.54 kg/m.     GEN: Well nourished, well developed, in no acute distress. HEENT: normal. Neck: Supple,  no JVD, carotid bruits, or masses. Cardiac: RRR, II-III/VI systolic murmur best heard at the RSB that radiates into the bilateral carotids, without rubs, or gallops. No clubbing, cyanosis, edema.  Radials/DP/PT 2+ and equal bilaterally.  Respiratory:  Respirations regular and unlabored, clear to auscultation bilaterally. GI: Soft, nontender, nondistended, BS + x 4. MS: no deformity or atrophy. Skin: warm and dry, no rash. Neuro:  Strength and sensation are intact. Psych: Normal affect.  Accessory Clinical Findings    ECG personally reviewed by me today- Sinus rhythm rate of 76 with incomplete left bundle branch block - No acute changes  Lab Results  Component Value Date   WBC 7.3 01/15/2020   HGB 16.7 02/04/2020   HCT 48.8 02/04/2020   MCV 82 01/15/2020   PLT 212 01/15/2020   Lab Results  Component Value Date   CREATININE 0.89 06/12/2022   BUN 18 06/12/2022   NA 138 06/12/2022   K 4.2 06/12/2022   CL 109 06/12/2022   CO2 23 06/12/2022   Lab Results  Component Value Date   ALT 40 01/15/2020   AST 19 01/15/2020   ALKPHOS 80 01/15/2020   BILITOT 0.6 01/15/2020   Lab Results  Component Value Date   CHOL 124 07/06/2018   HDL 28 (L) 07/06/2018   LDLCALC 28 07/06/2018   TRIG 341 (H) 07/06/2018   CHOLHDL 4.4 07/06/2018    Lab Results  Component Value Date   HGBA1C 7.5 (H) 07/05/2018    Assessment & Plan   1.  Coronary artery disease involving native coronary arteries with stable angina -Last heart catheterization was done in 06/2018 which revealed significant underlying two-vessel disease with a patent proximal RCA stent.  There was 95% stenosis  in the mid left circumflex which was the culprit for the NSTEMI.  There was also a 60% stenosis in the mid to distal RCA -Resolute Onyx 2.5 x 15 mm DES placed to the left circumflex -He continues to have exertional chest pain even though he is on Imdur -Lexiscan Myoview last year overall was low risk -Blood pressure has been  better controlled 115/67 today, as previously there was concern for chest discomfort related to uncontrolled BP -Continued with associated exertional shortness of breath -Outpatient right and left heart catheterization scheduled with Dr Fletcher Anon  2. Peripheral arterial disease -Mildly reduced ABIs on the left side with evidence of borderline significant disease affecting -Continues to have occasional claudication -Distal pulses are palpable to bilateral lower extremities  3.Moderate aortic stenosis -Continues to have exertional chest discomfort and associated exertional dyspnea -Continues on Zetia and Tricor -VTI measures 1.67 cm on echocardiogram -Moderate aortic stenosis with a peak to peak gradient of 15 to 20 mmHg on his last heart catheterization in 2019 -Being scheduled for right and left heart catheterization  4.Chronic systolic heart failure -Recent echocardiogram showed drop in EF 40 to 45% -Weight is up from 114.3 kg on 05/03/2022 218.5 kg today -Reminded about sodium intake and fluid intake and advised that as he continues to drink beer on the weekends that beer does have a high sodium quanity -Continue Entresto 97/103 mg twice daily -Continue spironolactone 25 mg daily -Limited echocardiogram is scheduled in 3 months after Entresto therapy initiated  5.Hyperlipidemia -History of statin intolerance -Continue Zetia 10 mg daily and Tricor 145 mg daily -LDL 108/26/22  6.Essential hypertension -Blood pressure better controlled today -115/67 -With associated symptoms of dizziness and not being orthostatic and other patients concerns today amlodipine is being held to allow for benefit of Entresto dosing without having to reduce -Patient is to continue to keep a blood pressure log at home  Disposition follow-up in 1 to 2 weeks post procedure  Jaking Thayer, NP 06/15/2022, 12:38 PM

## 2022-06-15 NOTE — H&P (View-Only) (Signed)
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  Past Medical History    Past Medical History:  Diagnosis Date   Aortic stenosis    a. 04/2018 Echo: mild to mod AS w/ mild AI; b. 05/2020 Echo: Mod AS; c. 05/2021 Echo: Mod AS; d. 05/2022 Echo: EF 40-45%, Mod AS (AVA 1.67 cm^2 - VTI, mean grad 24mmHg).   Arthritis    lower back, right knee   CAD S/P percutaneous coronary angioplasty 2003   a. remote PCI in 2003; b. Myoview 10/18 no ischemia, EF 39%; b. 06/2018 NSTEMI/PCI: LM nl, LAD 20p, LCX 77m (2.5x15 Resolute Onyx DES), RCA patent stent prox, 60d, EF 50-55%; c. 10/2019 MV: No isch/infarct; d. 03/2021 MV: EF 30-44%, no ischemia.   Chronic combined systolic (congestive) and diastolic (congestive) heart failure ([place])    a. 04/2018 Echo: EF to 50-55%, Gr1DD; b. 05/2020 Echo: EF 60-65%, Gr1 DD; c. 05/2021 Echo: EF 50-55%, GrI DD; d. 05/2022 Echo: EF 40-45%, inf/septal HK, GrI DD.   Diabetes mellitus type II, controlled ([place])    HLD (hyperlipidemia)    a. intolerant to statins   Hypertension    Ischemic cardiomyopathy    a. 04/2018 Echo: EF to 50-55%, no RWMA, Gr1DD, mild to moderate aortic stenosis with mild aortic insufficiency; b. 05/2020 Echo: EF 60-65%, no rwma, Gr1 DD, nl RV size/fxn, mod dil LA. Mod AS; c. 05/2021 Echo: EF 50-55%, GrI DD; d. 05/2022 Echo: EF 40-45%, inf/septal HK, GrI DD, RVSP  37.74mmHg, mod dil LA, mild MR, mild AI, mod AS.   Myocardial infarction ([place]) 06/2018   PAD (peripheral artery disease) ([facility])    Sleep apnea    a. intolerant of CPAP   Past Surgical History:  Procedure Laterality Date   ARTHRODESIS POSTERIOR INTERBODY LUMBAR       CARDIAC CATHETERIZATION  2003   stent   CARPAL TUNNEL RELEASE Right 10/24/2017   Procedure: CARPAL TUNNEL RELEASE ENDOSCOPIC;  Surgeon: Corky Mull, MD;  Location: [place];  Service: Orthopedics;  Laterality: Right;  sleep apnea   CERVICAL SPINE SURGERY Right 01/23/2017   arthrodesis, discectomy, osteophytectomy, decompression  C3-C4, [facility]   COLONOSCOPY     CORONARY STENT INTERVENTION N/A 07/08/2018   Procedure: CORONARY STENT INTERVENTION;  Surgeon: [name], MD;  Location: [place] [facility];  Service: Cardiovascular;  Laterality: N/A;   HOLEP-LASER ENUCLEATION OF THE PROSTATE WITH MORCELLATION N/A 03/12/2020   Procedure: HOLEP-LASER ENUCLEATION OF THE PROSTATE WITH MORCELLATION;  Surgeon: Billey Co, MD;  Location: [facility];  Service: Urology;  Laterality: N/A;   JOINT REPLACEMENT Left    tkr   KNEE SURGERY Right 1966   LEFT HEART CATH AND CORONARY ANGIOGRAPHY N/A 07/08/2018   Procedure: LEFT HEART CATH AND CORONARY ANGIOGRAPHY;  Surgeon: [name], MD;  Location: [place] [facility];  Service: Cardiovascular;  Laterality: N/A;   REPLACEMENT TOTAL KNEE Left 2005    Allergies  Allergies  Allergen Reactions  Brilinta [Ticagrelor] Other (See Comments)     blood for 2 weeks when voiding   Capsicum Swelling    Paprika and black pepper both cause swelling of the lips   Ampicillin Itching    Did it involve swelling of the face/tongue/throat, SOB, or low BP? No Did it involve sudden or severe rash/hives, skin peeling, or any reaction on the inside of your mouth or nose? No Did you need to seek medical attention at a hospital or doctor's office? No When did it last happen?      10  years If all above answers are "NO", may proceed with cephalosporin use.     History of Present Illness    76 year old male with above complex past medical history including coronary artery disease, mild ischemic cardiomyopathy, heart failure with reduced ejection fraction, moderate aortic stenosis, essential hypertension, hyperlipidemia, statin intolerance, obesity, chronic low back pain, peripheral arterial disease with claudication, and sleep apnea with CPAP intolerance.  He had previously undergone stenting of the right coronary artery in 2003.  Stress testing in 2018 was nonischemic.  In July 2019, he presented with chest pain and NSTEMI and was found to have 95% stenosis to the mid left circumflex with a patent RCA stent.  The circumflex was successfully treated with a drug-eluting stent.  Echocardiogram showed moderate aortic stenosis with an EF of 50 to 55%.  He was unable to tolerate beta-blockers and given his prior statin intolerance he was started on Repatha.  Unfortunately he was unable to continue with that therapy due to cost.  He was subsequently started on Zetia which she has tolerated since that time.  He also unfortunately has been unable to stay on dual antiplatelet therapy with Brilinta being discontinued secondary to hematuria.  In November 2020 he had complaints of exertional chest discomfort underwent stress testing which was low risk without ischemia or infarct.  He had complaints of dyspnea and chest discomfort in 1 of 2022 and underwent stress testing which revealed no evidence of ischemia left ventricular ejection fraction was moderate with moderately reduced 30 to 44% correlation with echocardiogram was advised and there was an indeterminate risk due to reduced EF.  Repeat echocardiogram was completed in 05/2021 which revealed EF of 50 to 55% grade 1 diastolic dysfunction, moderate aortic stenosis and mild mitral regurg.  Evaluation in 06/2021 was discussed patient is scheduled for  appointment cardiac catheterization with the patient had to cancel the procedure due to a family emergency.  At that time due to his uncontrolled hypertension he had HCTZ added to his daily medication regimen.  On his return visit in 09/13/2021 he had undergone spine surgery at [facility] with improvement in his systems but had to stop taking the HCTZ due to the development of gout he had also had a weight loss of 22 pounds and he currently denies chest pain or worsening dyspnea.  3/23 continued with some elevated blood pressures mild chest discomfort with overexertion, fluid retention, weight gain, and shortness of breath, and occasional palpitations.  He underwent another echocardiogram in 05/2022 which revealed LV EF of 40 to 45% which was a dropped mildly decreased function, grade 1 diastolic dysfunction, mildly elevated pulmonary artery systolic pressure, mild mitral regurgitation, aortic valve is normal in structure with severe calcification on the aortic valve, moderate aortic valve stenosis and mild aortic regurgitation.  Patient in today with continued complaints of exertional dyspnea and chest discomfort.  He states that his shortness of breath is worse on walking  in his driveway that is at a graded incline and his chest discomfort is the same heaviness that has not changed in frequency or intensity. He also states that during the evening when he lies in the bed he feels like his chest is full of fluid and he wakes several times during the night with a congested cough that has been productive.  He also has some dizziness complaints today but was found not to be orthostatic.  Of note he was recently started on Entresto 97/103 mg bid.  He and his wife both state the patient is a very med compliant.  They do have concerns and questions today about the drop in his EF was found on recent echocardiogram.    Home Medications    Current Outpatient Medications  Medication Sig Dispense Refill   acetaminophen  (TYLENOL) 500 MG tablet Take 1,000 mg by mouth every 8 (eight) hours as needed for moderate pain.     albuterol (PROVENTIL HFA;VENTOLIN HFA) 108 (90 Base) MCG/ACT inhaler Inhale 2 puffs into the lungs daily.     aspirin 81 MG tablet Take 81 mg by mouth at bedtime.      ezetimibe (ZETIA) 10 MG tablet TAKE 1 TABLET BY MOUTH EVERY DAY 90 tablet 0   fenofibrate (TRICOR) 145 MG tablet TAKE 1 TABLET BY MOUTH EVERY DAY 90 tablet 1   fluticasone (FLONASE) 50 MCG/ACT nasal spray Place 1 spray into both nostrils at bedtime.      isosorbide mononitrate (IMDUR) 30 MG 24 hr tablet TAKE 1 TABLET BY MOUTH EVERY DAY 90 tablet 2   methocarbamol (ROBAXIN) 500 MG tablet Take 500 mg by mouth daily as needed for muscle spasms.     mirabegron ER (MYRBETRIQ) 50 MG TB24 tablet Take 1 tablet (50 mg total) by mouth daily as needed (overactive bladder symptoms/leakage). 30 tablet 11   Naphazoline HCl (CLEAR EYES OP) Place 1 drop into both eyes daily as needed (redness).     nitroGLYCERIN (NITROSTAT) 0.4 MG SL tablet PLACE 1 TABLET UNDER THE TONGUE EVERY 5 (FIVE) MINUTES AS NEEDED FOR CHEST PAIN. MAXIMUM OF 3 DOSES. 25 tablet 0   sacubitril-valsartan (ENTRESTO) 97-103 MG Take 1 tablet by mouth 2 (two) times daily. 60 tablet 3   TRULICITY 0.75 MG/0.5ML SOPN Inject 0.75 mg into the skin every Monday.     spironolactone (ALDACTONE) 25 MG tablet Take 1 tablet (25 mg total) by mouth daily. 30 tablet 5   Current Facility-Administered Medications  Medication Dose Route Frequency Provider Last Rate Last Admin   sodium chloride flush (NS) 0.9 % injection 3 mL  3 mL Intravenous Q12H Kamron Vanwyhe, NP         Family History    Family History  Problem Relation Age of Onset   CAD Father    Lung cancer Father    Kidney cancer Father    Diabetes Mother    Alzheimer's disease Mother    He indicated that the status of his mother is unknown. He indicated that the status of his father is unknown.  Social History    Social  History   Socioeconomic History   Marital status: Married    Spouse name: Becky   Number of children: Not on file   Years of education: Not on file   Highest education level: Not on file  Occupational History   Not on file  Tobacco Use   Smoking status: Former    Types: Cigarettes    Quit date: 1988  Years since quitting: 35.4   Smokeless tobacco: Never  Vaping Use   Vaping Use: Never used  Substance and Sexual Activity   Alcohol use: Yes    Alcohol/week: 11.0 standard drinks of alcohol    Types: 10 Cans of beer, 1 Shots of liquor per week   Drug use: Not Currently   Sexual activity: Yes    Birth control/protection: None  Other Topics Concern   Not on file  Social History Narrative   Not on file   Social Determinants of Health   Financial Resource Strain: Not on file  Food Insecurity: Not on file  Transportation Needs: Not on file  Physical Activity: Not on file  Stress: Not on file  Social Connections: Not on file  Intimate Partner Violence: Not on file     Review of Systems    General:  No chills, fever, night sweats or weight changes.  Cardiovascular:  Endorses chest pain, dyspnea on exertion, edema, but denies orthopnea, palpitations, paroxysmal nocturnal dyspnea. Dermatological: No rash, lesions/masses Respiratory: Endorses nocturnal cough, and chronic dyspnea Urologic: No hematuria, dysuria Abdominal:   No nausea, vomiting, diarrhea, bright red blood per rectum, melena, or hematemesis Neurologic:  No visual changes, wkns, changes in mental status. All other systems reviewed and are otherwise negative except as noted above.     Physical Exam    VS:  BP 115/67 (BP Location: Left Arm, Patient Position: Sitting, Cuff Size: Normal)   Pulse 75   Ht 6\' 2"  (1.88 m)   Wt 261 lb 3.2 oz (118.5 kg)   SpO2 96%   BMI 33.54 kg/m  , BMI Body mass index is 33.54 kg/m.     GEN: Well nourished, well developed, in no acute distress. HEENT: normal. Neck: Supple,  no JVD, carotid bruits, or masses. Cardiac: RRR, II-III/VI systolic murmur best heard at the RSB that radiates into the bilateral carotids, without rubs, or gallops. No clubbing, cyanosis, edema.  Radials/DP/PT 2+ and equal bilaterally.  Respiratory:  Respirations regular and unlabored, clear to auscultation bilaterally. GI: Soft, nontender, nondistended, BS + x 4. MS: no deformity or atrophy. Skin: warm and dry, no rash. Neuro:  Strength and sensation are intact. Psych: Normal affect.  Accessory Clinical Findings    ECG personally reviewed by me today- Sinus rhythm rate of 76 with incomplete left bundle branch block - No acute changes  Lab Results  Component Value Date   WBC 7.3 01/15/2020   HGB 16.7 02/04/2020   HCT 48.8 02/04/2020   MCV 82 01/15/2020   PLT 212 01/15/2020   Lab Results  Component Value Date   CREATININE 0.89 06/12/2022   BUN 18 06/12/2022   NA 138 06/12/2022   K 4.2 06/12/2022   CL 109 06/12/2022   CO2 23 06/12/2022   Lab Results  Component Value Date   ALT 40 01/15/2020   AST 19 01/15/2020   ALKPHOS 80 01/15/2020   BILITOT 0.6 01/15/2020   Lab Results  Component Value Date   CHOL 124 07/06/2018   HDL 28 (L) 07/06/2018   LDLCALC 28 07/06/2018   TRIG 341 (H) 07/06/2018   CHOLHDL 4.4 07/06/2018    Lab Results  Component Value Date   HGBA1C 7.5 (H) 07/05/2018    Assessment & Plan   1.  Coronary artery disease involving native coronary arteries with stable angina -Last heart catheterization was done in 06/2018 which revealed significant underlying two-vessel disease with a patent proximal RCA stent.  There was 95% stenosis  in the mid left circumflex which was the culprit for the NSTEMI.  There was also a 60% stenosis in the mid to distal RCA -Resolute Onyx 2.5 x 15 mm DES placed to the left circumflex -He continues to have exertional chest pain even though he is on Imdur -Lexiscan Myoview last year overall was low risk -Blood pressure has been  better controlled 115/67 today, as previously there was concern for chest discomfort related to uncontrolled BP -Continued with associated exertional shortness of breath -Outpatient right and left heart catheterization scheduled with Dr Fletcher Anon  2. Peripheral arterial disease -Mildly reduced ABIs on the left side with evidence of borderline significant disease affecting -Continues to have occasional claudication -Distal pulses are palpable to bilateral lower extremities  3.Moderate aortic stenosis -Continues to have exertional chest discomfort and associated exertional dyspnea -Continues on Zetia and Tricor -VTI measures 1.67 cm on echocardiogram -Moderate aortic stenosis with a peak to peak gradient of 15 to 20 mmHg on his last heart catheterization in 2019 -Being scheduled for right and left heart catheterization  4.Chronic systolic heart failure -Recent echocardiogram showed drop in EF 40 to 45% -Weight is up from 114.3 kg on 05/03/2022 218.5 kg today -Reminded about sodium intake and fluid intake and advised that as he continues to drink beer on the weekends that beer does have a high sodium quanity -Continue Entresto 97/103 mg twice daily -Continue spironolactone 25 mg daily -Limited echocardiogram is scheduled in 3 months after Entresto therapy initiated  5.Hyperlipidemia -History of statin intolerance -Continue Zetia 10 mg daily and Tricor 145 mg daily -LDL 108/26/22  6.Essential hypertension -Blood pressure better controlled today -115/67 -With associated symptoms of dizziness and not being orthostatic and other patients concerns today amlodipine is being held to allow for benefit of Entresto dosing without having to reduce -Patient is to continue to keep a blood pressure log at home  Disposition follow-up in 1 to 2 weeks post procedure  Madgie Dhaliwal, NP 06/15/2022, 12:38 PM

## 2022-06-20 ENCOUNTER — Encounter: Payer: Self-pay | Admitting: Cardiovascular Disease

## 2022-07-01 ENCOUNTER — Other Ambulatory Visit: Payer: Self-pay | Admitting: Cardiovascular Disease

## 2022-07-03 ENCOUNTER — Ambulatory Visit
Admission: RE | Admit: 2022-07-03 | Discharge: 2022-07-03 | Disposition: A | Discharge status: Home / Self Care | Payer: PPO | Attending: Cardiovascular Disease | Admitting: Cardiovascular Disease

## 2022-07-03 ENCOUNTER — Encounter: Payer: Self-pay | Admitting: Cardiovascular Disease

## 2022-07-03 ENCOUNTER — Other Ambulatory Visit: Payer: Self-pay

## 2022-07-03 ENCOUNTER — Encounter
Admission: RE | Disposition: A | Discharge status: Home / Self Care | Payer: PPO | Source: Home / Self Care | Attending: Cardiovascular Disease

## 2022-07-03 DIAGNOSIS — I350 Nonrheumatic aortic (valve) stenosis: Secondary | ICD-10-CM

## 2022-07-03 DIAGNOSIS — I110 Hypertensive heart disease with heart failure: Secondary | ICD-10-CM | POA: Insufficient documentation

## 2022-07-03 DIAGNOSIS — I255 Ischemic cardiomyopathy: Secondary | ICD-10-CM | POA: Diagnosis not present

## 2022-07-03 DIAGNOSIS — Z79899 Other long term (current) drug therapy: Secondary | ICD-10-CM | POA: Insufficient documentation

## 2022-07-03 DIAGNOSIS — G4730 Sleep apnea, unspecified: Secondary | ICD-10-CM | POA: Diagnosis not present

## 2022-07-03 DIAGNOSIS — I252 Old myocardial infarction: Secondary | ICD-10-CM | POA: Insufficient documentation

## 2022-07-03 DIAGNOSIS — I5022 Chronic systolic (congestive) heart failure: Secondary | ICD-10-CM | POA: Insufficient documentation

## 2022-07-03 DIAGNOSIS — I2510 Atherosclerotic heart disease of native coronary artery without angina pectoris: Secondary | ICD-10-CM

## 2022-07-03 DIAGNOSIS — Z87891 Personal history of nicotine dependence: Secondary | ICD-10-CM | POA: Insufficient documentation

## 2022-07-03 DIAGNOSIS — Z7985 Long-term (current) use of injectable non-insulin antidiabetic drugs: Secondary | ICD-10-CM | POA: Diagnosis not present

## 2022-07-03 DIAGNOSIS — I25118 Atherosclerotic heart disease of native coronary artery with other forms of angina pectoris: Secondary | ICD-10-CM | POA: Diagnosis not present

## 2022-07-03 DIAGNOSIS — Z955 Presence of coronary angioplasty implant and graft: Secondary | ICD-10-CM | POA: Diagnosis not present

## 2022-07-03 DIAGNOSIS — E785 Hyperlipidemia, unspecified: Secondary | ICD-10-CM | POA: Insufficient documentation

## 2022-07-03 DIAGNOSIS — E1151 Type 2 diabetes mellitus with diabetic peripheral angiopathy without gangrene: Secondary | ICD-10-CM | POA: Diagnosis not present

## 2022-07-03 HISTORY — PX: RIGHT/LEFT HEART CATH AND CORONARY ANGIOGRAPHY: CATH118266

## 2022-07-03 LAB — GLUCOSE, CAPILLARY: Glucose-Capillary: 97 mg/dL (ref 70–99)

## 2022-07-03 SURGERY — RIGHT/LEFT HEART CATH AND CORONARY ANGIOGRAPHY
Anesthesia: Moderate Sedation | Laterality: Bilateral

## 2022-07-03 MED ORDER — SODIUM CHLORIDE 0.9% FLUSH: 3.0000 mL | INTRAVENOUS | Status: DC | PRN

## 2022-07-03 MED ORDER — SODIUM CHLORIDE 0.9 % IV SOLN: INTRAVENOUS | Status: DC

## 2022-07-03 MED ORDER — SODIUM CHLORIDE 0.9 % IV SOLN: 250.0000 mL | INTRAVENOUS | Status: DC | PRN

## 2022-07-03 MED ORDER — VERAPAMIL HCL 2.5 MG/ML IV SOLN
INTRAVENOUS | Status: DC | PRN
  Administered 2022-07-03: 2.5 mg via INTRA_ARTERIAL

## 2022-07-03 MED ORDER — LIDOCAINE HCL 1 % IJ SOLN
INTRAMUSCULAR | Status: AC
  Filled 2022-07-03: qty 20

## 2022-07-03 MED ORDER — HEPARIN SODIUM (PORCINE) 1000 UNIT/ML IJ SOLN
INTRAMUSCULAR | Status: AC
  Filled 2022-07-03: qty 10

## 2022-07-03 MED ORDER — ASPIRIN 81 MG PO CHEW
CHEWABLE_TABLET | ORAL | Status: AC
  Filled 2022-07-03: qty 1

## 2022-07-03 MED ORDER — MIDAZOLAM HCL 2 MG/2ML IJ SOLN
INTRAMUSCULAR | Status: AC
  Filled 2022-07-03: qty 2

## 2022-07-03 MED ORDER — HEPARIN SODIUM (PORCINE) 1000 UNIT/ML IJ SOLN
INTRAMUSCULAR | Status: DC | PRN
  Administered 2022-07-03: 5000 [IU] via INTRAVENOUS

## 2022-07-03 MED ORDER — FENTANYL CITRATE (PF) 100 MCG/2ML IJ SOLN
INTRAMUSCULAR | Status: AC
  Filled 2022-07-03: qty 2

## 2022-07-03 MED ORDER — HEPARIN (PORCINE) IN NACL 2000-0.9 UNIT/L-% IV SOLN
INTRAVENOUS | Status: DC | PRN
  Administered 2022-07-03: 1000 mL

## 2022-07-03 MED ORDER — ASPIRIN 81 MG PO CHEW
CHEWABLE_TABLET | ORAL | Status: DC | PRN
  Administered 2022-07-03: 81 mg via ORAL

## 2022-07-03 MED ORDER — VERAPAMIL HCL 2.5 MG/ML IV SOLN
INTRAVENOUS | Status: AC
  Filled 2022-07-03: qty 2

## 2022-07-03 MED ORDER — IOHEXOL 300 MG/ML  SOLN
INTRAMUSCULAR | Status: DC | PRN
  Administered 2022-07-03: 55 mL

## 2022-07-03 MED ORDER — FENTANYL CITRATE (PF) 100 MCG/2ML IJ SOLN
INTRAMUSCULAR | Status: DC | PRN
  Administered 2022-07-03: 25 ug via INTRAVENOUS

## 2022-07-03 MED ORDER — LIDOCAINE HCL (PF) 1 % IJ SOLN
INTRAMUSCULAR | Status: DC | PRN
  Administered 2022-07-03: 2 mL

## 2022-07-03 MED ORDER — HEPARIN (PORCINE) IN NACL 1000-0.9 UT/500ML-% IV SOLN
INTRAVENOUS | Status: AC
  Filled 2022-07-03: qty 1000

## 2022-07-03 MED ORDER — MIDAZOLAM HCL 2 MG/2ML IJ SOLN
INTRAMUSCULAR | Status: DC | PRN
  Administered 2022-07-03: 1 mg via INTRAVENOUS

## 2022-07-03 SURGICAL SUPPLY — 14 items
BAND CMPR LRG ZPHR (HEMOSTASIS) ×1
BAND ZEPHYR COMPRESS 30 LONG (HEMOSTASIS) ×1 IMPLANT
CATH 5FR JL3.5 JR4 ANG PIG MP (CATHETERS) ×1 IMPLANT
CATH BALLN WEDGE 5F 110CM (CATHETERS) ×1 IMPLANT
DRAPE BRACHIAL (DRAPES) ×2 IMPLANT
GLIDESHEATH SLEND SS 6F .021 (SHEATH) ×1 IMPLANT
GUIDEWIRE INQWIRE 1.5J.035X260 (WIRE) IMPLANT
INQWIRE 1.5J .035X260CM (WIRE) ×2
KIT SYRINGE INJ CVI SPIKEX1 (MISCELLANEOUS) ×1 IMPLANT
PACK CARDIAC CATH (CUSTOM PROCEDURE TRAY) ×3 IMPLANT
PROTECTION STATION PRESSURIZED (MISCELLANEOUS) ×2
SET ATX SIMPLICITY (MISCELLANEOUS) ×1 IMPLANT
SHEATH GLIDE SLENDER 4/5FR (SHEATH) ×1 IMPLANT
STATION PROTECTION PRESSURIZED (MISCELLANEOUS) IMPLANT

## 2022-07-03 NOTE — Interval H&P Note (Signed)
Cath Lab Visit (complete for each Cath Lab visit)  Clinical Evaluation Leading to the Procedure:   ACS: No.  Non-ACS:    Anginal Classification: CCS III  Anti-ischemic medical therapy: Maximal Therapy (2 or more classes of medications)  Non-Invasive Test Results: Low-risk stress test findings: cardiac mortality <1%/year  Prior CABG: No previous CABG      History and Physical Interval Note:  07/03/2022 9:47 AM  Kurt Kelley  has presented today for surgery, with the diagnosis of R and L Cath    Coronary artery disease.  The various methods of treatment have been discussed with the patient and family. After consideration of risks, benefits and other options for treatment, the patient has consented to  Procedure(s): RIGHT/LEFT HEART CATH AND CORONARY ANGIOGRAPHY (Bilateral) as a surgical intervention.  The patient's history has been reviewed, patient examined, no change in status, stable for surgery.  I have reviewed the patient's chart and labs.  Questions were answered to the patient's satisfaction.     Lorine Bears
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
## 2022-07-12 ENCOUNTER — Encounter: Payer: Self-pay | Admitting: Cardiology

## 2022-07-12 ENCOUNTER — Ambulatory Visit: Payer: PPO | Admitting: Cardiology

## 2022-07-12 VITALS — BP 118/70 | HR 78 | Ht 74.0 in | Wt 260.0 lb

## 2022-07-12 DIAGNOSIS — T466X5A Adverse effect of antihyperlipidemic and antiarteriosclerotic drugs, initial encounter: Secondary | ICD-10-CM

## 2022-07-12 DIAGNOSIS — I10 Essential (primary) hypertension: Secondary | ICD-10-CM

## 2022-07-12 DIAGNOSIS — I350 Nonrheumatic aortic (valve) stenosis: Secondary | ICD-10-CM

## 2022-07-12 DIAGNOSIS — I5022 Chronic systolic (congestive) heart failure: Secondary | ICD-10-CM

## 2022-07-12 DIAGNOSIS — E785 Hyperlipidemia, unspecified: Secondary | ICD-10-CM | POA: Diagnosis not present

## 2022-07-12 DIAGNOSIS — I2510 Atherosclerotic heart disease of native coronary artery without angina pectoris: Secondary | ICD-10-CM

## 2022-07-12 DIAGNOSIS — M791 Myalgia, unspecified site: Secondary | ICD-10-CM

## 2022-07-12 DIAGNOSIS — I739 Peripheral vascular disease, unspecified: Secondary | ICD-10-CM | POA: Diagnosis not present

## 2022-07-12 NOTE — Patient Instructions (Signed)
Medication Instructions:   Your physician recommends that you continue on your current medications as directed. Please refer to the Current Medication list given to you today.  *If you need a refill on your cardiac medications before your next appointment, please call your pharmacy*   Lab Work:  None ordered  Testing/Procedures:  None ordered   Follow-Up: At [facility], you and your health needs are our priority.  As part of our continuing mission to provide you with exceptional heart care, we have created designated Provider Care Teams.  These Care Teams include your primary Cardiologist (physician) and Advanced Practice Providers (APPs -  Physician Assistants and Nurse Practitioners) who all work together to provide you with the care you need, when you need it.  We recommend signing up for the patient portal called "MyChart".  Sign up information is provided on this After Visit Summary.  MyChart is used to connect with patients for Virtual Visits (Telemedicine).  Patients are able to view lab/test results, encounter notes, upcoming appointments, etc.  Non-urgent messages can be sent to your provider as well.   To learn more about what you can do with MyChart, go to [email or web address].    Your next appointment:    Follow up as scheduled    Important Information About Sugar       

## 2022-08-02 ENCOUNTER — Other Ambulatory Visit: Payer: Self-pay | Admitting: Cardiovascular Disease

## 2022-08-02 ENCOUNTER — Encounter: Payer: Self-pay | Admitting: Cardiovascular Disease

## 2022-08-03 MED ORDER — NITROGLYCERIN 0.4 MG SL SUBL: 0.4000 mg | SUBLINGUAL_TABLET | SUBLINGUAL | 0 refills | Status: AC | PRN

## 2022-09-11 DIAGNOSIS — M5030 Other cervical disc degeneration, unspecified cervical region: Secondary | ICD-10-CM | POA: Diagnosis not present

## 2022-09-11 DIAGNOSIS — M5136 Other intervertebral disc degeneration, lumbar region: Secondary | ICD-10-CM | POA: Diagnosis not present

## 2022-09-11 DIAGNOSIS — Z96652 Presence of left artificial knee joint: Secondary | ICD-10-CM | POA: Diagnosis not present

## 2022-09-11 DIAGNOSIS — Z981 Arthrodesis status: Secondary | ICD-10-CM | POA: Diagnosis not present

## 2022-09-11 DIAGNOSIS — M5134 Other intervertebral disc degeneration, thoracic region: Secondary | ICD-10-CM | POA: Diagnosis not present

## 2022-09-14 ENCOUNTER — Other Ambulatory Visit: Payer: Self-pay | Admitting: Cardiovascular Disease

## 2022-09-15 ENCOUNTER — Other Ambulatory Visit: Payer: PPO

## 2022-09-15 ENCOUNTER — Ambulatory Visit: Payer: PPO | Attending: Nurse Practitioner

## 2022-09-15 DIAGNOSIS — I25118 Atherosclerotic heart disease of native coronary artery with other forms of angina pectoris: Secondary | ICD-10-CM

## 2022-09-15 DIAGNOSIS — I255 Ischemic cardiomyopathy: Secondary | ICD-10-CM

## 2022-09-15 LAB — ECHOCARDIOGRAM LIMITED
AV Mean grad: 23 mmHg
AV Peak grad: 38.2 mmHg
Ao pk vel: 3.09 m/s
Area-P 1/2: 2.84 cm2
Calc EF: 43.8 %
S' Lateral: 5.2 cm
Single Plane A2C EF: 40.8 %
Single Plane A4C EF: 46.5 %

## 2022-09-19 ENCOUNTER — Other Ambulatory Visit: Payer: Self-pay | Admitting: Cardiovascular Disease

## 2022-09-19 ENCOUNTER — Telehealth: Payer: Self-pay | Admitting: *Deleted

## 2022-09-19 NOTE — Telephone Encounter (Signed)
Left voicemail message to call back for review of results and recommendations.  

## 2022-09-19 NOTE — Telephone Encounter (Signed)
-----   Message from Theora Gianotti, NP sent at 09/18/2022  2:10 PM EDT ----- Low normal heart squeezing function with an ejection fraction of 50-55%-improved from prior echo.  The heart is mildly stiff.  Mildly leaky mitral valve with moderately tight aortic valve.  Aortic stenosis is stable compared to June echo.

## 2022-09-19 NOTE — Telephone Encounter (Signed)
Patient is returning call. Please advise? 

## 2022-09-20 NOTE — Telephone Encounter (Signed)
Reviewed results with patient and spouse. Confirmed upcoming appointment. They verbalized understanding with no further questions at this time.

## 2022-09-21 ENCOUNTER — Ambulatory Visit: Payer: PPO | Attending: Cardiovascular Disease | Admitting: Cardiovascular Disease

## 2022-09-21 ENCOUNTER — Encounter: Payer: Self-pay | Admitting: Cardiovascular Disease

## 2022-09-21 ENCOUNTER — Other Ambulatory Visit
Admission: RE | Admit: 2022-09-21 | Discharge: 2022-09-21 | Disposition: A | Discharge status: Home / Self Care | Payer: PPO | Attending: Cardiovascular Disease | Admitting: Cardiovascular Disease

## 2022-09-21 VITALS — BP 128/58 | HR 78 | Ht 74.0 in | Wt 259.2 lb

## 2022-09-21 DIAGNOSIS — E785 Hyperlipidemia, unspecified: Secondary | ICD-10-CM | POA: Diagnosis not present

## 2022-09-21 DIAGNOSIS — I5022 Chronic systolic (congestive) heart failure: Secondary | ICD-10-CM | POA: Diagnosis not present

## 2022-09-21 DIAGNOSIS — I350 Nonrheumatic aortic (valve) stenosis: Secondary | ICD-10-CM

## 2022-09-21 DIAGNOSIS — I25118 Atherosclerotic heart disease of native coronary artery with other forms of angina pectoris: Secondary | ICD-10-CM | POA: Diagnosis not present

## 2022-09-21 DIAGNOSIS — I10 Essential (primary) hypertension: Secondary | ICD-10-CM | POA: Diagnosis not present

## 2022-09-21 DIAGNOSIS — G720 Drug-induced myopathy: Secondary | ICD-10-CM | POA: Diagnosis not present

## 2022-09-21 DIAGNOSIS — I739 Peripheral vascular disease, unspecified: Secondary | ICD-10-CM | POA: Diagnosis not present

## 2022-09-21 DIAGNOSIS — T466X5D Adverse effect of antihyperlipidemic and antiarteriosclerotic drugs, subsequent encounter: Secondary | ICD-10-CM | POA: Diagnosis not present

## 2022-09-21 LAB — LIPID PANEL
Cholesterol: 169 mg/dL (ref 0–200)
HDL: 38 mg/dL — ABNORMAL LOW (ref >40)
LDL Cholesterol: 100 mg/dL — ABNORMAL HIGH (ref 0–99)
Total CHOL/HDL Ratio: 4.4 RATIO
Triglycerides: 154 mg/dL — ABNORMAL HIGH (ref <150)
VLDL: 31 mg/dL (ref 0–40)

## 2022-09-21 NOTE — Progress Notes (Signed)
Cardiology Office Note   Date:  09/21/2022   ID:  Kurt Kelley, DOB 11-Dec-1946, MRN [id number]  PCP:  Kurt Mina, MD  Cardiologist:  Kurt Bears, MD   Chief Complaint  Patient presents with   Other    6 month f/u c/o chest pressure and rapid heart beat. Meds reviewed verbally with pt.      History of Present Illness: Kurt Kelley is a 76 y.o. male who is here today for a follow-up visit regarding coronary artery disease, chronic systolic heart failure and aortic stenosis.  The patient has known history of coronary artery disease with previous stent placement in 2003, chronic systolic heart failure due to mild ischemic cardiomyopathy, aortic stenosis, essential hypertension, hyperlipidemia, sleep apnea and obesity.  He has known history of intolerance to statins due to severe myalgia.  He also reports intolerance to CPAP due to claustrophobia.    Vascular evaluation in May 2019 showed an ABI of 1.05 on the right and 0.97 on the left.  Duplex showed mild atherosclerosis in the right lower extremity.  On the left, there was moderate disease affecting the distal SFA. The patient was hospitalized in July 2019 with non-ST elevation myocardial infarction.  Cardiac catheterization showed mild LAD disease, 95% stenosis in the mid left circumflex, patent RCA stent and 60% distal RCA stenosis.  I performed successful angioplasty and drug-eluting stent placement to the left circumflex.  There was evidence of moderate aortic stenosis with a peak gradient of 15 to 20 mmHg.  EF was 50 to 55%.  The patient did not tolerate beta-blockers.  He was started on Repatha but unfortunately was not able to continue due to cost.  He had hematuria on dual antiplatelet therapy that improved after discontinuing Brilinta. He did undergo cystoscopy which showed marked BPH felt to be the source of hematuria.    He had back surgery done in 2022 at [facility] with improvement in back pain. Hydrochlorothiazide was  discontinued due to gout.  He was seen in March and reported continued episodes of shortness of breath and intermittent chest pain.  He had an echocardiogram done in June which showed an EF of 40 to 45% with mild pulmonary hypertension and mild mitral regurgitation.  Aortic stenosis was moderate.  Given his persistent symptoms, he underwent cardiac catheterization in July which showed patent RCA and left circumflex stents with no significant restenosis.  There was moderate three-vessel coronary artery disease with some progression of mid LAD stenosis but did not appear obstructive.  EF was 40 to 45%.  Right heart catheterization showed high normal filling pressures, no significant pulmonary hypertension and normal cardiac output.  Aortic stenosis was mild with a mean gradient of 11 mmHg.  He had repeated echocardiogram this month which showed an EF of 50 to 55% with moderate aortic stenosis and mean gradient of 23 mmHg.  He has been doing reasonably well although he does complain of mild nausea with Entresto.  No recent chest pain.  Past Medical History:  Diagnosis Date   Aortic stenosis    a. 04/2018 Echo: mild to mod AS w/ mild AI; b. 05/2020 Echo: Mod AS; c. 05/2021 Echo: Mod AS; d. 05/2022 Echo: EF 40-45%, Mod AS (AVA 1.67 cm^2 - VTI, mean grad ).   Arthritis    lower back, right knee   CAD S/P percutaneous coronary angioplasty 2003   a. remote PCI in 2003; b. Myoview 10/18 no ischemia, EF 39%; b. 06/2018 NSTEMI/PCI: LM nl,  LAD 20p, LCX 66m (2.5x15 Resolute Onyx DES), RCA patent stent prox, 60d, EF 50-55%; c. 10/2019 MV: No isch/infarct; d. 03/2021 MV: EF 30-44%, no ischemia.   Chronic combined systolic (congestive) and diastolic (congestive) heart failure (HCC)    a. 04/2018 Echo: EF to 50-55%, Gr1DD; b. 05/2020 Echo: EF 60-65%, Gr1 DD; c. 05/2021 Echo: EF 50-55%, GrI DD; d. 05/2022 Echo: EF 40-45%, inf/septal HK, GrI DD.   Diabetes mellitus type II, controlled (HCC)    HLD (hyperlipidemia)     a. intolerant to statins   Hypertension    Ischemic cardiomyopathy    a. 04/2018 Echo: EF to 50-55%, no RWMA, Gr1DD, mild to moderate aortic stenosis with mild aortic insufficiency; b. 05/2020 Echo: EF 60-65%, no rwma, Gr1 DD, nl RV size/fxn, mod dil LA. Mod AS; c. 05/2021 Echo: EF 50-55%, GrI DD; d. 05/2022 Echo: EF 40-45%, inf/septal HK, GrI DD, RVSP 37.107mmHg, mod dil LA, mild MR, mild AI, mod AS.   Myocardial infarction (HCC) 06/2018   PAD (peripheral artery disease) (HCC)    Sleep apnea    a. intolerant of CPAP    Past Surgical History:  Procedure Laterality Date   ARTHRODESIS POSTERIOR INTERBODY LUMBAR       CARDIAC CATHETERIZATION  2003   stent   CARPAL TUNNEL RELEASE Right 10/24/2017   Procedure: CARPAL TUNNEL RELEASE ENDOSCOPIC;  Surgeon: Christena Flake, MD;  Location: [facility];  Service: Orthopedics;  Laterality: Right;  sleep apnea   CERVICAL SPINE SURGERY Right 01/23/2017   arthrodesis, discectomy, osteophytectomy, decompression  C3-C4, [facility]   COLONOSCOPY     CORONARY STENT INTERVENTION N/A 07/08/2018   Procedure: CORONARY STENT INTERVENTION;  Surgeon: Iran Ouch, MD;  Location: [facility] INVASIVE CV LAB;  Service: Cardiovascular;  Laterality: N/A;   HOLEP-LASER ENUCLEATION OF THE PROSTATE WITH MORCELLATION N/A 03/12/2020   Procedure: HOLEP-LASER ENUCLEATION OF THE PROSTATE WITH MORCELLATION;  Surgeon: Sondra Come, MD;  Location: [facility] ORS;  Service: Urology;  Laterality: N/A;   JOINT REPLACEMENT Left    tkr   KNEE SURGERY Right 1966   LEFT HEART CATH AND CORONARY ANGIOGRAPHY N/A 07/08/2018   Procedure: LEFT HEART CATH AND CORONARY ANGIOGRAPHY;  Surgeon: Iran Ouch, MD;  Location: [facility] INVASIVE CV LAB;  Service: Cardiovascular;  Laterality: N/A;   REPLACEMENT TOTAL KNEE Left 2005   RIGHT/LEFT HEART CATH AND CORONARY ANGIOGRAPHY Bilateral 07/03/2022   Procedure: RIGHT/LEFT HEART CATH AND CORONARY ANGIOGRAPHY;  Surgeon: Iran Ouch, MD;  Location: [facility]  INVASIVE CV LAB;  Service: Cardiovascular;  Laterality: Bilateral;     Current Outpatient Medications  Medication Sig Dispense Refill   acetaminophen (TYLENOL) 500 MG tablet Take 1,000 mg by mouth every 8 (eight) hours as needed for moderate pain.     albuterol (PROVENTIL HFA;VENTOLIN HFA) 108 (90 Base) MCG/ACT inhaler Inhale 2 puffs into the lungs daily.     aspirin 81 MG tablet Take 81 mg by mouth at bedtime.      ezetimibe (ZETIA) 10 MG tablet TAKE 1 TABLET BY MOUTH EVERY DAY 90 tablet 0   fenofibrate (TRICOR) 145 MG tablet TAKE 1 TABLET BY MOUTH EVERY DAY 90 tablet 1   fluticasone (FLONASE) 50 MCG/ACT nasal spray Place 1 spray into both nostrils at bedtime.     isosorbide mononitrate (IMDUR) 30 MG 24 hr tablet TAKE 1 TABLET BY MOUTH EVERY DAY 90 tablet 2   methocarbamol (ROBAXIN) 500 MG tablet Take 500 mg by mouth daily as needed for muscle  spasms.     mirabegron ER (MYRBETRIQ) 50 MG TB24 tablet Take 1 tablet (50 mg total) by mouth daily as needed (overactive bladder symptoms/leakage). 30 tablet 11   Naphazoline HCl (CLEAR EYES OP) Place 1 drop into both eyes daily as needed (redness).     nitroGLYCERIN (NITROSTAT) 0.4 MG SL tablet Place 1 tablet (0.4 mg total) under the tongue every 5 (five) minutes as needed for chest pain. 25 tablet 0   sacubitril-valsartan (ENTRESTO) 97-103 MG Take 1 tablet by mouth 2 (two) times daily. 180 tablet 1   spironolactone (ALDACTONE) 25 MG tablet Take 1 tablet (25 mg total) by mouth daily. 90 tablet 1   TRULICITY 3.71 GG/2.6RS SOPN Inject 0.75 mg into the skin every Monday.     No current facility-administered medications for this visit.    Allergies:   Brilinta [ticagrelor], Capsicum, and Ampicillin    Social History:  The patient  reports that he quit smoking about 35 years ago. His smoking use included cigarettes. He has never used smokeless tobacco. He reports current alcohol use of about 11.0 standard drinks of alcohol per week. He reports that he  does not currently use drugs.   Family History:  The patient's family history includes Alzheimer's disease in his mother; CAD in his father; Diabetes in his mother; Kidney cancer in his father; Lung cancer in his father.    ROS:  Please see the history of present illness.   Otherwise, review of systems are positive for none.   All other systems are reviewed and negative.    PHYSICAL EXAM: VS:  BP (!) 128/58 (BP Location: Left Arm, Patient Position: Sitting, Cuff Size: Large)   Pulse 78   Ht 6\' 2"  (1.88 m)   Wt 259 lb 4 oz (117.6 kg)   SpO2 98%   BMI 33.29 kg/m  , BMI Body mass index is 33.29 kg/m. GEN: Well nourished, well developed, in no acute distress  HEENT: normal  Neck: no JVD, carotid bruits, or masses Cardiac: RRR; no rubs, or gallops,no edema . 2 /6 systolic ejection murmur in the aortic area which is mid peaking with mildly diminished S2 Respiratory:  clear to auscultation bilaterally, normal work of breathing GI: soft, nontender, nondistended, + BS MS: no deformity or atrophy  Skin: warm and dry, no rash Neuro:  Strength and sensation are intact Psych: euthymic mood, full affect Radial pulses normal.   EKG:  EKG is ordered today. The EKG today demonstrates normal sinus rhythm with incomplete left bundle branch block.  Recent Labs: 06/15/2022: BUN 19; Creatinine, Ser 1.23; Hemoglobin 17.5; Platelets 255; Potassium 4.6; Sodium 141    Lipid Panel    Component Value Date/Time   CHOL 124 07/06/2018 0530   TRIG 341 (H) 07/06/2018 0530   HDL 28 (L) 07/06/2018 0530   CHOLHDL 4.4 07/06/2018 0530   VLDL 68 (H) 07/06/2018 0530   LDLCALC 28 07/06/2018 0530      Wt Readings from Last 3 Encounters:  09/21/22 259 lb 4 oz (117.6 kg)  07/12/22 260 lb (117.9 kg)  07/03/22 250 lb (113.4 kg)          04/09/2018    2:57 PM  PAD Screen  Previous PAD dx? No  Previous surgical procedure? No  Pain with walking? Yes  Subsides with rest? No  Feet/toe relief with  dangling? No  Painful, non-healing ulcers? No  Extremities discolored? Yes      ASSESSMENT AND PLAN:  1.  Coronary artery disease  involving native coronary arteries with stable angina: Cardiac catheterization showed patent RCA and left circumflex stents with moderate nonobstructive disease.  Continue medical therapy.  He did not tolerate beta-blockers in the past.  Continue Imdur.    2. Peripheral arterial disease: The patient has mildly reduced ABI on the left side with evidence of borderline significant disease affecting the left SFA.  Distal pulses are palpable.  3.   Moderate aortic stenosis: This was stable on most recent echocardiogram.  Recommend repeat echocardiogram in September 2024.  4.  Chronic systolic heart failure: I discussed with him the importance of continuing Entresto.  He is also on spironolactone.  He is intolerant to beta-blockers.  5.  Hyperlipidemia: Intolerance to statins.  Unfortunately, he could not afford Repatha.  Continue Zetia and fenofibrate for now.  I requested a repeat lipid profile.  I am concerned that his LDL remains above target of less than 70.  This is concerning given his extensive cardiovascular history.  Recommend starting inclisiran.  6.  Essential hypertension: Blood pressure is controlled on current medications.   Disposition:   FU with me in 6 months     Signed, Kurt Bears, MD 09/21/22 [facility] [place], [place] [phone]

## 2022-09-21 NOTE — Patient Instructions (Signed)
Medication Instructions:  Your physician recommends that you continue on your current medications as directed. Please refer to the Current Medication list given to you today.  *If you need a refill on your cardiac medications before your next appointment, please call your pharmacy*   Lab Work: Lipid today  Please have your lab drawn at the [facility]. Stop at the Registration desk to check in.  If you have labs (blood work) drawn today and your tests are completely normal, you will receive your results only by: [place] (if you have MyChart) OR A paper copy in the mail If you have any lab test that is abnormal or we need to change your treatment, we will call you to review the results.   Testing/Procedures: None ordered   Follow-Up: At [facility], you and your health needs are our priority.  As part of our continuing mission to provide you with exceptional heart care, we have created designated Provider Care Teams.  These Care Teams include your primary Cardiologist (physician) and Advanced Practice Providers (APPs -  Physician Assistants and Nurse Practitioners) who all work together to provide you with the care you need, when you need it.  We recommend signing up for the patient portal called "MyChart".  Sign up information is provided on this After Visit Summary.  MyChart is used to connect with patients for Virtual Visits (Telemedicine).  Patients are able to view lab/test results, encounter notes, upcoming appointments, etc.  Non-urgent messages can be sent to your provider as well.   To learn more about what you can do with MyChart, go to [email or web address].    Your next appointment:   6 month(s)  You have been referred to [facility] in [place] (For [place] consideration)    The format for your next appointment:   In Person  Provider:   You may see Kathlyn Sacramento, MD or one of the following Advanced Practice Providers on your designated Care  Team:   Pfeifer Hodgkins, NP Christell Faith, PA-C Cadence Kathlen Mody, PA-C Gerrie Nordmann, NP   Other Instructions N/A  Important Information About Sugar

## 2022-09-24 ENCOUNTER — Encounter: Payer: Self-pay | Admitting: Cardiovascular Disease

## 2022-09-26 ENCOUNTER — Other Ambulatory Visit: Payer: Self-pay | Admitting: *Deleted

## 2022-09-26 MED ORDER — ENTRESTO 97-103 MG PO TABS: 1.0000 | ORAL_TABLET | Freq: Two times a day (BID) | ORAL | 3 refills | Status: DC

## 2022-10-02 ENCOUNTER — Other Ambulatory Visit: Payer: Self-pay | Admitting: Cardiovascular Disease

## 2022-10-02 NOTE — Telephone Encounter (Signed)
Rx refill sent to pharmacy. 

## 2022-10-09 ENCOUNTER — Telehealth: Payer: Self-pay | Admitting: Cardiovascular Disease

## 2022-10-09 NOTE — Telephone Encounter (Signed)
Fax received from [employer] stating the patient has been approved for [place] assistance from 10/03/22-12/24/22,

## 2022-10-27 ENCOUNTER — Ambulatory Visit: Payer: PPO | Attending: Cardiovascular Disease | Admitting: Pharmacist

## 2022-10-27 ENCOUNTER — Telehealth: Payer: Self-pay | Admitting: Pharmacist

## 2022-10-27 DIAGNOSIS — E782 Mixed hyperlipidemia: Secondary | ICD-10-CM | POA: Diagnosis not present

## 2022-10-27 MED ORDER — REPATHA SURECLICK 140 MG/ML ~~LOC~~ SOAJ: 1.0000 mL | SUBCUTANEOUS | 11 refills | Status: DC

## 2022-10-27 NOTE — Telephone Encounter (Signed)
Prior authorization for Repatha approved through 04/27/23. LVM for patient to call back

## 2022-10-27 NOTE — Patient Instructions (Addendum)
I will submit a prior authorization for Repatha I will call you once I hear back Please continue taking Zetia 10mg  daily and tricor 145mg  daily  Call me at [phone] with any questions
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
## 2022-10-27 NOTE — Assessment & Plan Note (Signed)
Assessment:  LDL-C elevated above goal of <70 (or 55) Just on ezetimibe due to statin intolerance.  [place] now open Only reason he stopped was due to cost  Plan: Resume Repatha 140mg  q 14 days. Will submit PA Patient already approved for [place] and information given to pt  Continue zetia and fenofibrate Labs in 3 months

## 2022-11-08 NOTE — Telephone Encounter (Signed)
Third attempted made to contact patient. LVM for him to call back.

## 2022-12-27 ENCOUNTER — Telehealth: Payer: Self-pay | Admitting: Cardiology

## 2022-12-27 NOTE — Telephone Encounter (Signed)
Application completed, faxed, and filed.  

## 2023-01-01 ENCOUNTER — Other Ambulatory Visit: Payer: Self-pay | Admitting: Pharmacist

## 2023-01-01 ENCOUNTER — Telehealth: Payer: Self-pay | Admitting: Pharmacist

## 2023-01-01 DIAGNOSIS — E782 Mixed hyperlipidemia: Secondary | ICD-10-CM

## 2023-01-01 NOTE — Telephone Encounter (Signed)
Follow up lipid lab ordered. In Feb it would be 3 months on Repatha. Caregiver (wife has been informed)

## 2023-01-12 ENCOUNTER — Encounter: Payer: Self-pay | Admitting: Urology

## 2023-01-25 NOTE — Telephone Encounter (Signed)
Called and LVM to set up apt for labs

## 2023-02-11 IMAGING — MR MR LUMBAR SPINE W/O CM
6 of 7 series · 23 of 48 positions shown · non-contrast
Comparison: MRI of the lumbar spine July 14, 2020.

CLINICAL DATA: Chronic low back pain. Spinal stenosis of
lumbosacral region.

EXAM:
MRI LUMBAR SPINE WITHOUT CONTRAST
TECHNIQUE: Multiplanar, multisequence MR imaging of the lumbar spine was
performed. No intravenous contrast was administered.

[Series 5: T2 · sagittal · 4.0mm · 0.94mm/px · 2 of 17 slices shown (1 of 2)]
[im 1/17]
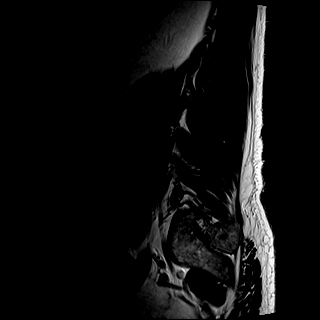
[im 17/17]
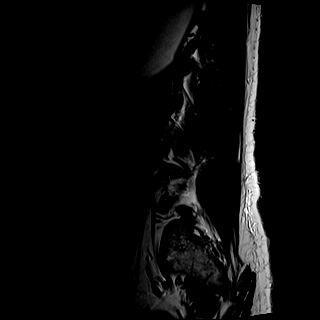

[Series 6: T1 · sagittal · 4.0mm · 0.94mm/px · 2 of 17 slices shown (1 of 2)]
[im 1/17]
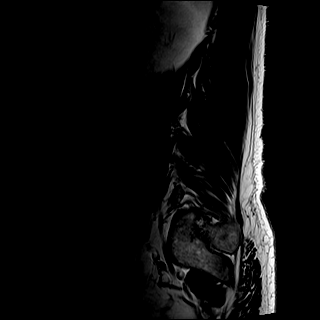
[im 17/17]
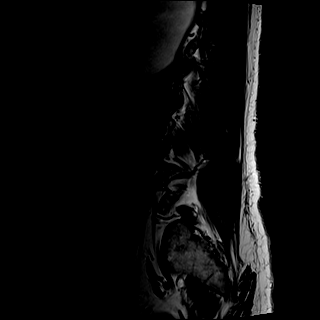

[Series 7: STIR · sagittal · 4.0mm · 0.47mm/px · 3 of 17 slices shown]
[im 1/17]
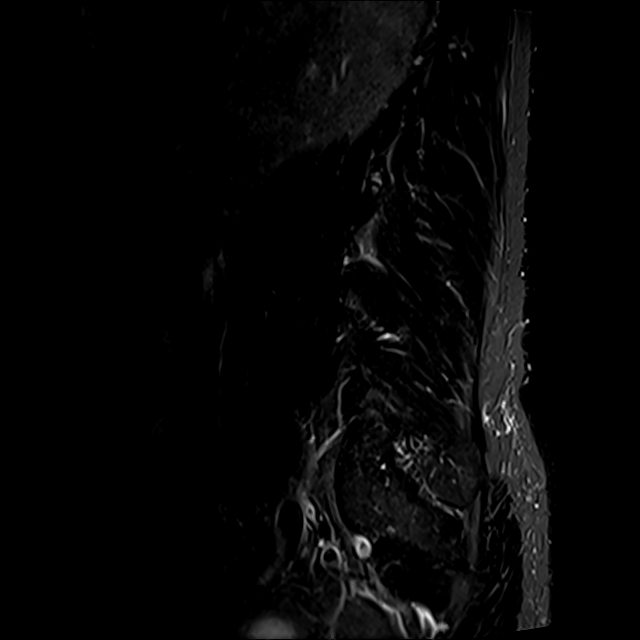
[im 9/17]
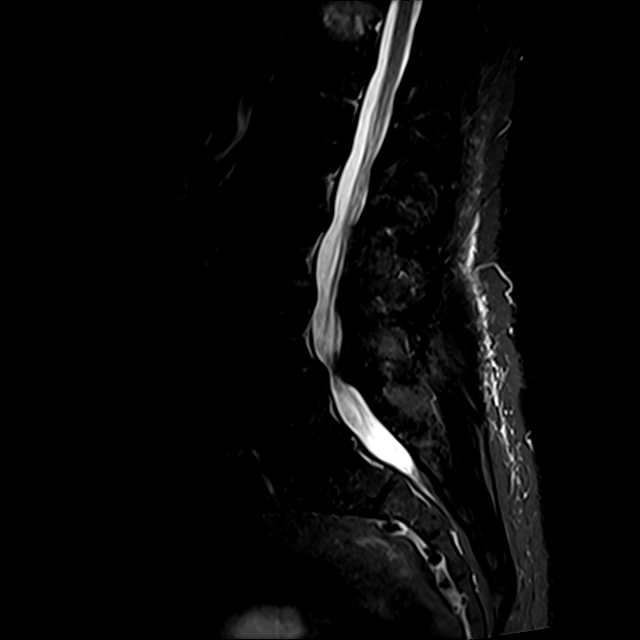
[im 17/17]
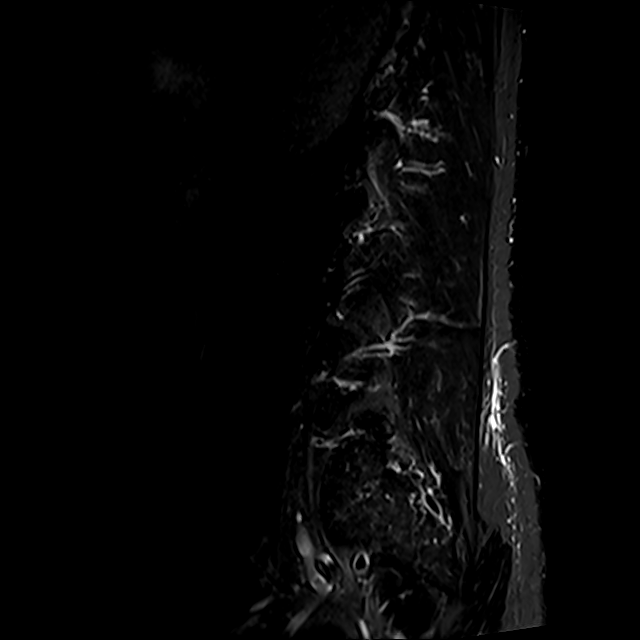

[Series 8: [id number]_iso · sagittal · 1.0mm · 0.47mm/px · 2 of 80 slices shown]
[im 1/80]
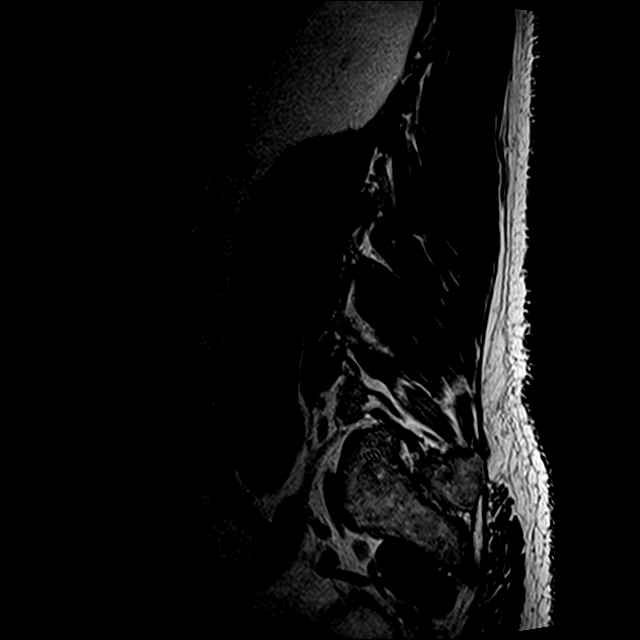
[im 14/80]
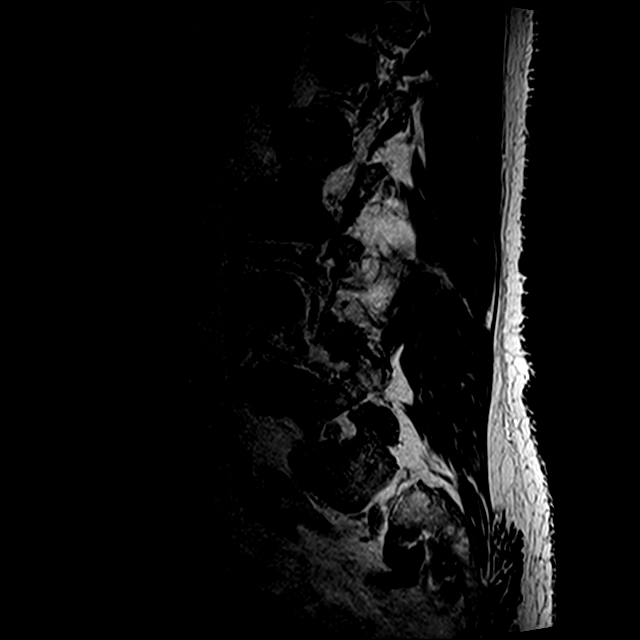

[Series 9: T2 · axial · 4.0mm · 0.78mm/px · z∈[-7,+238]mm · 7 of 43 slices shown (2 of 2)]
[im 1/43]
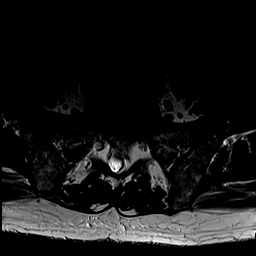
[im 8/43]
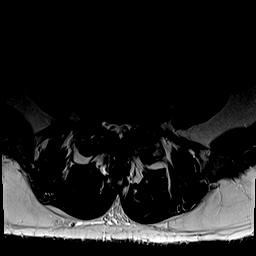
[im 15/43]
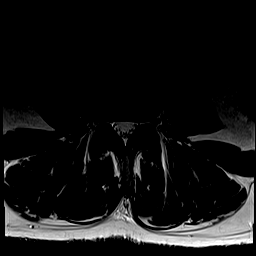
[im 22/43]
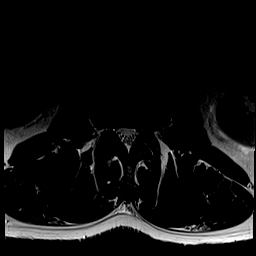
[im 29/43]
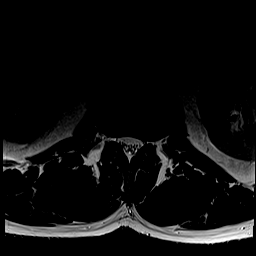
[im 36/43]
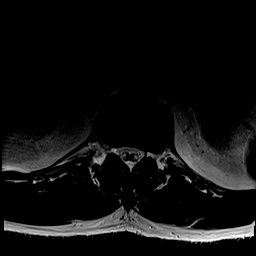
[im 43/43]
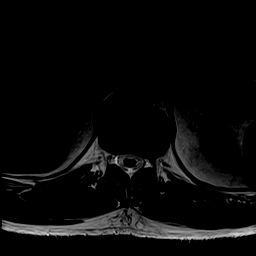

[Series 12: T1 · axial · 4.0mm · 0.39mm/px · z∈[-7,+238]mm · 7 of 43 slices shown (2 of 2)]
[im 1/43]
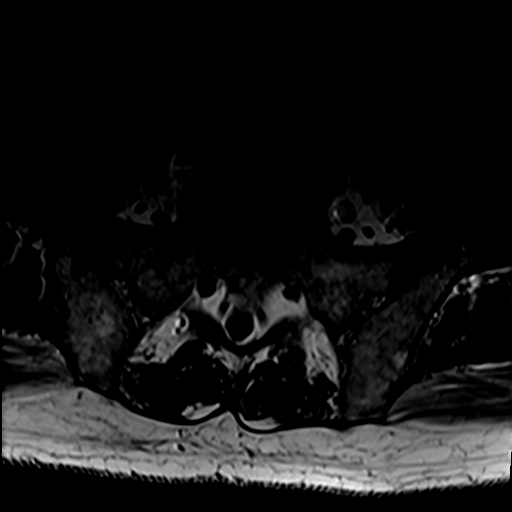
[im 8/43]
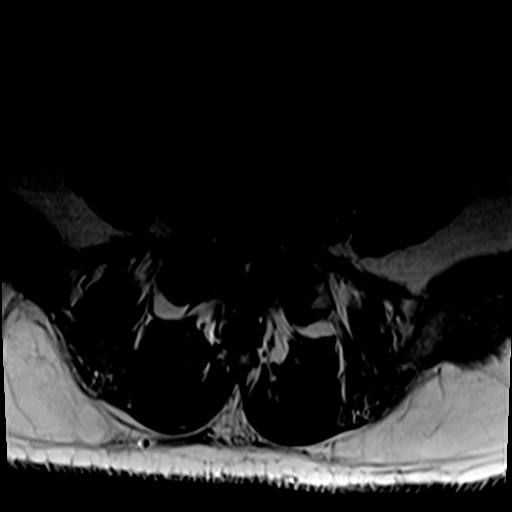
[im 15/43]
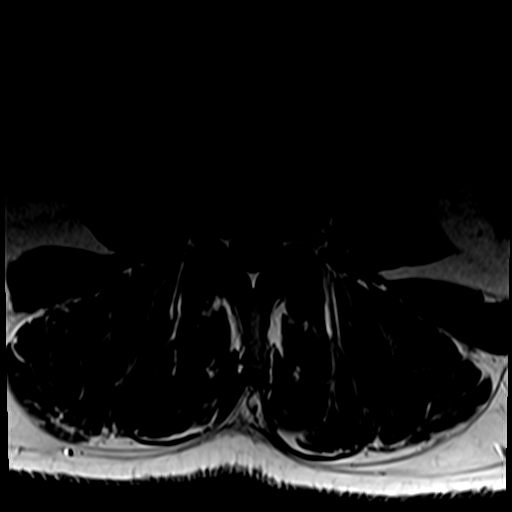
[im 22/43]
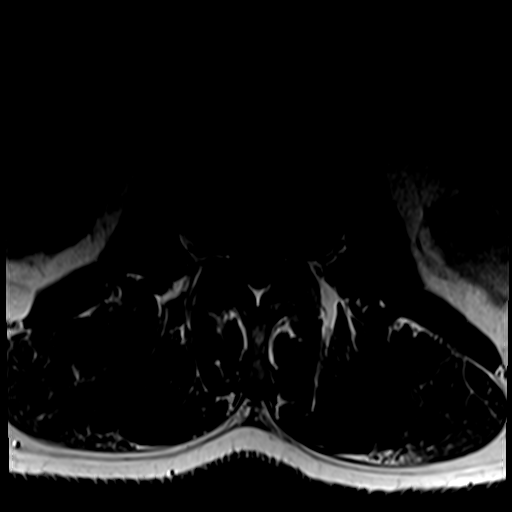
[im 29/43]
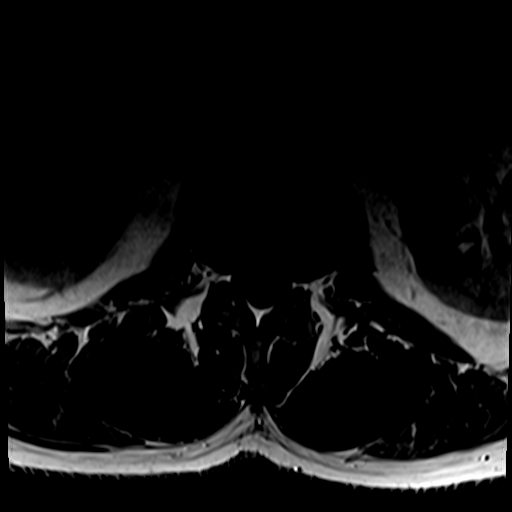
[im 36/43]
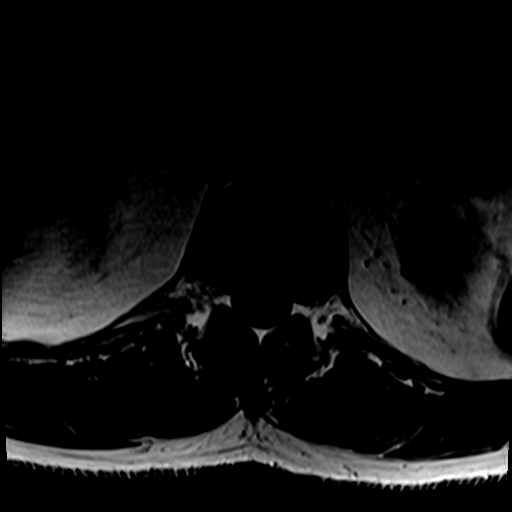
[im 43/43]
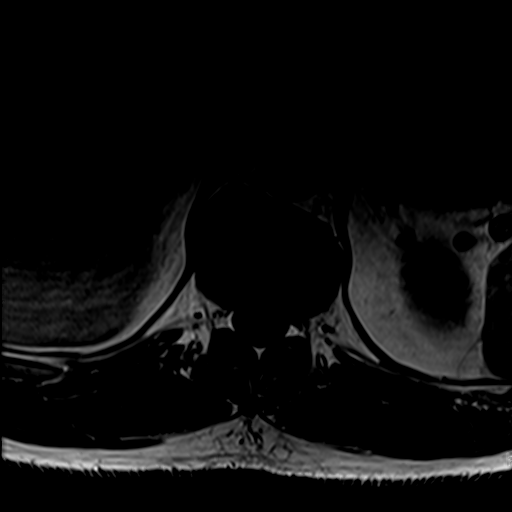

[23 of 48 positions shown; findings below may reference images not displayed]

FINDINGS: Segmentation:  Standard.

Alignment:  Small anterolisthesis of L4 over L5.

Vertebrae: No fracture, evidence of discitis, or aggressive bone
lesion. T12 hemangioma.

Conus medullaris and cauda equina: Conus extends to the L1-2 level.
Conus and cauda equina appear normal.

Paraspinal and other soft tissues: Negative.

Disc levels:

T12-L1: Shallow disc bulge and mild facet degenerative changes. No
spinal canal or neural foraminal stenosis.

L1-2: Shallow disc bulge and mild facet degenerative changes. No
spinal canal or neural foraminal stenosis.

L2-3: Disc bulge, mild facet degenerative changes ligamentum flavum
redundancy causing mild narrowing of the bilateral subarticular
zones, mild right and moderate left neural foraminal narrowing,
unchanged.

L3-4: Shallow disc bulge, moderate facet degenerative changes and
ligamentum flavum redundancy resulting in mild bilateral neural
foraminal narrowing. No spinal canal stenosis.

L4-5: Disc bulge/disc uncovering, prominent hypertrophic facet
degenerative changes and ligamentum flavum redundancy resulting in
severe spinal canal stenosis with narrowing of the bilateral
subarticular zones and moderate left neural foraminal narrowing. No
significant change from prior MRI.

L5-S1: Shallow disc bulge and moderate facet degenerative changes,
right greater than left. No significant spinal canal or neural
foraminal stenosis.
IMPRESSION: 1. Severe spinal canal stenosis and moderate left neural foraminal
narrowing at L4-5, not significantly changed from prior MRI, mostly
secondary to facet arthropathy.
2. Moderate facet degenerative changes at L3-4 and L4-5.

## 2023-03-07 ENCOUNTER — Encounter: Payer: Self-pay | Admitting: Cardiovascular Disease

## 2023-03-14 DIAGNOSIS — I10 Essential (primary) hypertension: Secondary | ICD-10-CM | POA: Diagnosis not present

## 2023-03-14 DIAGNOSIS — E118 Type 2 diabetes mellitus with unspecified complications: Secondary | ICD-10-CM | POA: Diagnosis not present

## 2023-03-14 DIAGNOSIS — Z125 Encounter for screening for malignant neoplasm of prostate: Secondary | ICD-10-CM | POA: Diagnosis not present

## 2023-03-17 ENCOUNTER — Encounter: Payer: Self-pay | Admitting: Cardiovascular Disease

## 2023-03-22 ENCOUNTER — Ambulatory Visit: Payer: PPO | Attending: Cardiovascular Disease | Admitting: Cardiovascular Disease

## 2023-03-22 ENCOUNTER — Encounter: Payer: Self-pay | Admitting: Cardiovascular Disease

## 2023-03-22 VITALS — BP 138/70 | HR 82 | Ht 75.0 in | Wt 259.0 lb

## 2023-03-22 DIAGNOSIS — I2510 Atherosclerotic heart disease of native coronary artery without angina pectoris: Secondary | ICD-10-CM

## 2023-03-22 DIAGNOSIS — M791 Myalgia, unspecified site: Secondary | ICD-10-CM | POA: Diagnosis not present

## 2023-03-22 DIAGNOSIS — I10 Essential (primary) hypertension: Secondary | ICD-10-CM

## 2023-03-22 DIAGNOSIS — I5022 Chronic systolic (congestive) heart failure: Secondary | ICD-10-CM | POA: Diagnosis not present

## 2023-03-22 DIAGNOSIS — E785 Hyperlipidemia, unspecified: Secondary | ICD-10-CM

## 2023-03-22 DIAGNOSIS — T466X5D Adverse effect of antihyperlipidemic and antiarteriosclerotic drugs, subsequent encounter: Secondary | ICD-10-CM

## 2023-03-22 DIAGNOSIS — E782 Mixed hyperlipidemia: Secondary | ICD-10-CM

## 2023-03-22 DIAGNOSIS — I739 Peripheral vascular disease, unspecified: Secondary | ICD-10-CM | POA: Diagnosis not present

## 2023-03-22 DIAGNOSIS — I350 Nonrheumatic aortic (valve) stenosis: Secondary | ICD-10-CM

## 2023-03-22 DIAGNOSIS — T466X5A Adverse effect of antihyperlipidemic and antiarteriosclerotic drugs, initial encounter: Secondary | ICD-10-CM

## 2023-03-22 NOTE — Patient Instructions (Signed)
Medication Instructions:  STOP the Zetia STOP the Fenofibrate  RESUME the Spironolactone 25 mg once daily  *If you need a refill on your cardiac medications before your next appointment, please call your pharmacy*   Lab Work: None ordered If you have labs (blood work) drawn today and your tests are completely normal, you will receive your results only by: [place] (if you have MyChart) OR A paper copy in the mail If you have any lab test that is abnormal or we need to change your treatment, we will call you to review the results.   Testing/Procedures: Your physician has requested that you have an echocardiogram in 6 months. Echocardiography is a painless test that uses sound waves to create images of your heart. It provides your doctor with information about the size and shape of your heart and how well your heart's chambers and valves are working.   You may receive an ultrasound enhancing agent through an IV if needed to better visualize your heart during the echo. This procedure takes approximately one hour.  There are no restrictions for this procedure.  This will take place at [facility] [place] ([id number], [place]    Follow-Up: At [facility], you and your health needs are our priority.  As part of our continuing mission to provide you with exceptional heart care, we have created designated Provider Care Teams.  These Care Teams include your primary Cardiologist (physician) and Advanced Practice Providers (APPs -  Physician Assistants and Nurse Practitioners) who all work together to provide you with the care you need, when you need it.  We recommend signing up for the patient portal called "MyChart".  Sign up information is provided on this After Visit Summary.  MyChart is used to connect with patients for Virtual Visits (Telemedicine).  Patients are able to view lab/test results, encounter notes, upcoming appointments, etc.   Non-urgent messages can be sent to your provider as well.   To learn more about what you can do with MyChart, go to [email or web address].    Your next appointment:   6 month(s)  Provider:   You may see Kathlyn Sacramento, MD or one of the following Advanced Practice Providers on your designated Care Team:   Felty Hodgkins, NP Christell Faith, PA-C Cadence Kathlen Mody, PA-C Gerrie Nordmann, NP

## 2023-03-22 NOTE — Progress Notes (Signed)
Cardiology Office Note   Date:  03/22/2023   ID:  Kurt Kelley, DOB 1946/05/28, MRN [id number]  PCP:  Maryland Pink, MD  Cardiologist:  Kathlyn Sacramento, MD   Chief Complaint  Patient presents with   6 month follow up     Patient c/o nausea, muscle aches, fatigue/tired, chest pain, bilateral LE edema, frequent urination, rapid heart beats, difficulty sleeping, shortness of breath, cough & congestion. Medications reviewed by the patient verbally.       History of Present Illness: Kurt Kelley is a 77 y.o. male who is here today for a follow-up visit regarding coronary artery disease, chronic systolic heart failure and aortic stenosis.  The patient has known history of coronary artery disease with previous stent placement in [id number], chronic systolic heart failure due to mild ischemic cardiomyopathy, aortic stenosis, essential hypertension, hyperlipidemia, sleep apnea and obesity.  He has known history of intolerance to statins due to severe myalgia.  He also reports intolerance to CPAP due to claustrophobia.    Vascular evaluation in May 2019 showed an ABI of 1.05 on the right and 0.97 on the left.  Duplex showed mild atherosclerosis in the right lower extremity.  On the left, there was moderate disease affecting the distal SFA. The patient was hospitalized in July 2019 with non-ST elevation myocardial infarction.  Cardiac catheterization showed mild LAD disease, 95% stenosis in the mid left circumflex, patent RCA stent and 60% distal RCA stenosis.  I performed successful angioplasty and drug-eluting stent placement to the left circumflex.  There was evidence of moderate aortic stenosis with a peak gradient of 15 to 20 mmHg.  EF was 50 to 55%.  The patient did not tolerate beta-blockers.   He had hematuria on dual antiplatelet therapy that improved after discontinuing Brilinta. He did undergo cystoscopy which showed marked BPH felt to be the source of hematuria.    He had back surgery  done in 2022 at [facility] with improvement in back pain. Hydrochlorothiazide was discontinued due to gout.  He was seen in March and reported continued episodes of shortness of breath and intermittent chest pain.  He had an echocardiogram done in June which showed an EF of 40 to 45% with mild pulmonary hypertension and mild mitral regurgitation.  Aortic stenosis was moderate.  Most recent cardiac catheterization in July 2023 showed patent RCA and left circumflex stents with no significant restenosis.  There was moderate three-vessel coronary artery disease with some progression of mid LAD stenosis but did not appear obstructive.  EF was 40 to 45%.  Right heart catheterization showed high normal filling pressures, no significant pulmonary hypertension and normal cardiac output.  Aortic stenosis was mild with a mean gradient of 11 mmHg.  Most recent echocardiogram in September 2023 showed an EF of 50 to 55% with moderate aortic stenosis and mean gradient of 23 mmHg.  He has been doing reasonably well but reports continued fatigue.  He feels worse when he takes his medications and he thinks he is overmedicated.  He was started on Repatha with significant improvement in LDL.  Past Medical History:  Diagnosis Date   Aortic stenosis    a. 04/2018 Echo: mild to mod AS w/ mild AI; b. 05/2020 Echo: Mod AS; c. 05/2021 Echo: Mod AS; d. 05/2022 Echo: EF 40-45%, Mod AS (AVA 1.67 cm^2 - VTI, mean grad 51mmHg).   Arthritis    lower back, right knee   CAD S/P percutaneous coronary angioplasty 2003   a.  remote PCI in 2003; b. Myoview 10/18 no ischemia, EF 39%; b. 06/2018 NSTEMI/PCI: LM nl, LAD 20p, LCX 49m (2.5x15 Resolute Onyx DES), RCA patent stent prox, 60d, EF 50-55%; c. 10/2019 MV: No isch/infarct; d. 03/2021 MV: EF 30-44%, no ischemia.   Chronic combined systolic (congestive) and diastolic (congestive) heart failure ([place])    a. 04/2018 Echo: EF to 50-55%, Gr1DD; b. 05/2020 Echo: EF 60-65%, Gr1 DD; c. 05/2021 Echo: EF  50-55%, GrI DD; d. 05/2022 Echo: EF 40-45%, inf/septal HK, GrI DD.   Diabetes mellitus type II, controlled ([place])    HLD (hyperlipidemia)    a. intolerant to statins   Hypertension    Ischemic cardiomyopathy    a. 04/2018 Echo: EF to 50-55%, no RWMA, Gr1DD, mild to moderate aortic stenosis with mild aortic insufficiency; b. 05/2020 Echo: EF 60-65%, no rwma, Gr1 DD, nl RV size/fxn, mod dil LA. Mod AS; c. 05/2021 Echo: EF 50-55%, GrI DD; d. 05/2022 Echo: EF 40-45%, inf/septal HK, GrI DD, RVSP 37.74mmHg, mod dil LA, mild MR, mild AI, mod AS.   Myocardial infarction ([place]) 06/2018   PAD (peripheral artery disease) ([facility])    Sleep apnea    a. intolerant of CPAP    Past Surgical History:  Procedure Laterality Date   ARTHRODESIS POSTERIOR INTERBODY LUMBAR       CARDIAC CATHETERIZATION  2003   stent   CARPAL TUNNEL RELEASE Right 10/24/2017   Procedure: CARPAL TUNNEL RELEASE ENDOSCOPIC;  Surgeon: Corky Mull, MD;  Location: [place];  Service: Orthopedics;  Laterality: Right;  sleep apnea   CERVICAL SPINE SURGERY Right 01/23/2017   arthrodesis, discectomy, osteophytectomy, decompression  C3-C4, [facility]   COLONOSCOPY     CORONARY STENT INTERVENTION N/A 07/08/2018   Procedure: CORONARY STENT INTERVENTION;  Surgeon: [name], MD;  Location: [place] [facility];  Service: Cardiovascular;  Laterality: N/A;   HOLEP-LASER ENUCLEATION OF THE PROSTATE WITH MORCELLATION N/A 03/12/2020   Procedure: HOLEP-LASER ENUCLEATION OF THE PROSTATE WITH MORCELLATION;  Surgeon: Billey Co, MD;  Location: [facility];  Service: Urology;  Laterality: N/A;   JOINT REPLACEMENT Left    tkr   KNEE SURGERY Right 1966   LEFT HEART CATH AND CORONARY ANGIOGRAPHY N/A 07/08/2018   Procedure: LEFT HEART CATH AND CORONARY ANGIOGRAPHY;  Surgeon: [name], MD;  Location: [place] [facility];  Service: Cardiovascular;  Laterality: N/A;   REPLACEMENT TOTAL KNEE Left 2005   RIGHT/LEFT HEART CATH AND CORONARY  ANGIOGRAPHY Bilateral 07/03/2022   Procedure: RIGHT/LEFT HEART CATH AND CORONARY ANGIOGRAPHY;  Surgeon: [name], MD;  Location: [place] [facility];  Service: Cardiovascular;  Laterality: Bilateral;     Current Outpatient Medications  Medication Sig Dispense Refill   acetaminophen (TYLENOL) 500 MG tablet Take 1,000 mg by mouth every 8 (eight) hours as needed for moderate pain.     albuterol (PROVENTIL HFA;VENTOLIN HFA) 108 (90 Base) MCG/ACT inhaler Inhale 2 puffs into the lungs daily.     aspirin 81 MG tablet Take 81 mg by mouth at bedtime.      colchicine 0.6 MG tablet Take 0.6 mg by mouth as needed.     Evolocumab (REPATHA SURECLICK) [id number] MG/ML SOAJ Inject 140 mg into the skin every 14 (fourteen) days. 2 mL 11   fenofibrate (TRICOR) 145 MG tablet TAKE 1 TABLET BY MOUTH EVERY DAY 90 tablet 3   fluticasone (FLONASE) 50 MCG/ACT nasal spray Place 1 spray into both nostrils at bedtime.  isosorbide mononitrate (IMDUR) 30 MG 24 hr tablet TAKE 1 TABLET BY MOUTH EVERY DAY 90 tablet 3   methocarbamol (ROBAXIN) 500 MG tablet Take 500 mg by mouth daily as needed for muscle spasms.     mirabegron ER (MYRBETRIQ) 50 MG TB24 tablet Take 1 tablet (50 mg total) by mouth daily as needed (overactive bladder symptoms/leakage). 30 tablet 11   Naphazoline HCl (CLEAR EYES OP) Place 1 drop into both eyes daily as needed (redness).     nitroGLYCERIN (NITROSTAT) 0.4 MG SL tablet Place 1 tablet (0.4 mg total) under the tongue every 5 (five) minutes as needed for chest pain. 25 tablet 0   sacubitril-valsartan (ENTRESTO) 97-103 MG Take 1 tablet by mouth 2 (two) times daily. 180 tablet 3   spironolactone (ALDACTONE) 25 MG tablet Take 1 tablet (25 mg total) by mouth daily. 90 tablet 1   TRULICITY [id number] SOPN Inject 0.75 mg into the skin every Monday.     No current facility-administered medications for this visit.    Allergies:   Capsicum, Ticagrelor, and Ampicillin    Social History:  The patient   reports that he quit smoking about 36 years ago. His smoking use included cigarettes. He has never used smokeless tobacco. He reports current alcohol use of about 11.0 standard drinks of alcohol per week. He reports that he does not currently use drugs.   Family History:  The patient's family history includes Alzheimer's disease in his mother; CAD in his father; Diabetes in his mother; Kidney cancer in his father; Lung cancer in his father.    ROS:  Please see the history of present illness.   Otherwise, review of systems are positive for none.   All other systems are reviewed and negative.    PHYSICAL EXAM: VS:  BP 138/70 (BP Location: Left Arm, Patient Position: Sitting, Cuff Size: Normal)   Pulse 82   Ht 6\' 3"  (1.905 m)   Wt 259 lb (117.5 kg)   SpO2 97%   BMI 32.37 kg/m  , BMI Body mass index is 32.37 kg/m. GEN: Well nourished, well developed, in no acute distress  HEENT: normal  Neck: no JVD, carotid bruits, or masses Cardiac: RRR; no rubs, or gallops,no edema . 2 /6 systolic ejection murmur in the aortic area which is mid peaking with mildly diminished S2 Respiratory:  clear to auscultation bilaterally, normal work of breathing GI: soft, nontender, nondistended, + BS MS: no deformity or atrophy  Skin: warm and dry, no rash Neuro:  Strength and sensation are intact Psych: euthymic mood, full affect Radial pulses normal.   EKG:  EKG is ordered today. The EKG today demonstrates normal sinus rhythm with incomplete left bundle branch block.  Recent Labs: 06/15/2022: BUN 19; Creatinine, Ser 1.23; Hemoglobin 17.5; Platelets 255; Potassium 4.6; Sodium 141    Lipid Panel    Component Value Date/Time   CHOL 169 09/21/2022 1036   TRIG 154 (H) 09/21/2022 1036   HDL 38 (L) 09/21/2022 1036   CHOLHDL 4.4 09/21/2022 1036   VLDL 31 09/21/2022 1036   LDLCALC 100 (H) 09/21/2022 1036      Wt Readings from Last 3 Encounters:  03/22/23 259 lb (117.5 kg)  09/21/22 259 lb 4 oz (117.6  kg)  07/12/22 260 lb (117.9 kg)          04/09/2018    2:57 PM  PAD Screen  Previous PAD dx? No  Previous surgical procedure? No  Pain with walking? Yes  Subsides with  rest? No  Feet/toe relief with dangling? No  Painful, non-healing ulcers? No  Extremities discolored? Yes      ASSESSMENT AND PLAN:  1.  Coronary artery disease involving native coronary arteries with stable angina: Cardiac catheterization showed patent RCA and left circumflex stents with moderate nonobstructive disease.  Continue medical therapy.  He did not tolerate beta-blockers in the past.  Continue Imdur.    2. Peripheral arterial disease: The patient has mildly reduced ABI on the left side with evidence of borderline significant disease affecting the left SFA.  Distal pulses are palpable.  3.   Moderate aortic stenosis: Recommend repeat echocardiogram in 6 months from now.  4.  Chronic systolic heart failure: He is intolerant to beta-blockers due to fatigue.  He has not been taking spironolactone due to his concern about being a diuretic.  I explained to him the rationale for taking this medication and he will resume.  I also explained to him that the majority of his symptoms of fatigue and myalgia are not related to [facility] and he should continue the medication.  5.  Hyperlipidemia: Intolerance to statins.  He was placed back on Repatha and doing very well with the medication.  Recent lipid profile showed an LDL of 10 and triglyceride of 90.  I elected to discontinue ezetimibe and fenofibrate.  6.  Essential hypertension: Blood pressure is controlled on current medications.   Disposition:   FU with me in 6 months     Signed, Kathlyn Sacramento, MD 03/22/23 [place], [place]

## 2023-04-30 ENCOUNTER — Ambulatory Visit: Payer: PPO | Admitting: Urology

## 2023-05-22 ENCOUNTER — Encounter: Payer: Self-pay | Admitting: Pharmacist

## 2023-05-22 DIAGNOSIS — I350 Nonrheumatic aortic (valve) stenosis: Secondary | ICD-10-CM | POA: Diagnosis not present

## 2023-05-22 DIAGNOSIS — I255 Ischemic cardiomyopathy: Secondary | ICD-10-CM | POA: Diagnosis not present

## 2023-05-22 DIAGNOSIS — E119 Type 2 diabetes mellitus without complications: Secondary | ICD-10-CM | POA: Diagnosis not present

## 2023-05-22 DIAGNOSIS — I10 Essential (primary) hypertension: Secondary | ICD-10-CM | POA: Diagnosis not present

## 2023-05-22 DIAGNOSIS — E782 Mixed hyperlipidemia: Secondary | ICD-10-CM | POA: Diagnosis not present

## 2023-05-22 DIAGNOSIS — I2510 Atherosclerotic heart disease of native coronary artery without angina pectoris: Secondary | ICD-10-CM | POA: Diagnosis not present

## 2023-05-22 DIAGNOSIS — I739 Peripheral vascular disease, unspecified: Secondary | ICD-10-CM | POA: Diagnosis not present

## 2023-05-22 DIAGNOSIS — K219 Gastro-esophageal reflux disease without esophagitis: Secondary | ICD-10-CM | POA: Diagnosis not present

## 2023-05-22 DIAGNOSIS — R5383 Other fatigue: Secondary | ICD-10-CM | POA: Diagnosis not present

## 2023-05-22 DIAGNOSIS — I5022 Chronic systolic (congestive) heart failure: Secondary | ICD-10-CM | POA: Diagnosis not present

## 2023-05-23 ENCOUNTER — Ambulatory Visit: Payer: PPO | Admitting: Urology

## 2023-05-24 ENCOUNTER — Encounter: Payer: Self-pay | Admitting: Urology

## 2023-05-24 ENCOUNTER — Ambulatory Visit (INDEPENDENT_AMBULATORY_CARE_PROVIDER_SITE_OTHER): Payer: PPO | Admitting: Urology

## 2023-05-24 VITALS — BP 120/71 | HR 77 | Ht 75.0 in | Wt 255.0 lb

## 2023-05-24 DIAGNOSIS — N138 Other obstructive and reflux uropathy: Secondary | ICD-10-CM

## 2023-05-24 DIAGNOSIS — N401 Enlarged prostate with lower urinary tract symptoms: Secondary | ICD-10-CM | POA: Diagnosis not present

## 2023-05-24 LAB — BLADDER SCAN AMB NON-IMAGING

## 2023-05-24 NOTE — Progress Notes (Signed)
   05/24/2023 1:56 PM   Kurt Kelley May 05, 1946 [id number]  Reason for visit: Follow up HoLEP  HPI: I saw Kurt Kelley back in urology clinic today in follow-up after undergoing HOLEP on 03/12/2020 for a 160 g prostate with persistent significant gross hematuria from BPH, incomplete bladder emptying, bothersome urinary symptoms, and recurrent UTIs.  130 g of tissue were removed showing only benign prostate.    Overall he is doing extremely well, denies any urinary complaints today.  PVR is normal at 0ml.  He denies any urgency or incontinence.  He will occasionally take a Myrbetriq 50 mg as needed if he goes on a long motorcycle trip, and he feels this helps with some mild frequency.  Denies any recurrent gross hematuria or UTIs.  PSA with PCP was very low at 0.26 in March 2024, with his age and history of HOLEP, does not require further PSA screening.  He would like to continue to follow-up on a yearly basis, return precautions were discussed.  RTC 1 year PVR   Sondra Come, MD  [facility] [address] [place], [place] [address] [phone]

## 2023-07-08 ENCOUNTER — Encounter: Payer: Self-pay | Admitting: Cardiovascular Disease

## 2023-07-16 ENCOUNTER — Encounter: Payer: Self-pay | Admitting: Cardiovascular Disease

## 2023-07-16 ENCOUNTER — Telehealth: Payer: Self-pay | Admitting: Pharmacy Technician

## 2023-07-16 ENCOUNTER — Other Ambulatory Visit (HOSPITAL_COMMUNITY): Payer: Self-pay

## 2023-07-16 MED ORDER — REPATHA SURECLICK 140 MG/ML ~~LOC~~ SOAJ: 1.0000 mL | SUBCUTANEOUS | 11 refills | Status: DC

## 2023-07-16 NOTE — Telephone Encounter (Signed)
Pharmacy Patient Advocate Encounter   Received notification from CoverMyMeds that prior authorization for Repatha SureClick 140MG /ML auto-injectors  is required/requested.   Insurance verification completed.   The patient is insured through [employer]/ [employer] .   Per test claim: PA submitted to [employer]/ [employer] via CoverMyMeds Key/confirmation #/EOC [id number] Status is pending

## 2023-07-18 ENCOUNTER — Other Ambulatory Visit: Payer: Self-pay | Admitting: Urology

## 2023-07-18 NOTE — Telephone Encounter (Signed)
Pharmacy Patient Advocate Encounter  Received notification from [employer]/ [employer] that Prior Authorization for REPATHA has been  APPROVED FROM 7.22.24 UNTIL 7.22.25

## 2023-08-15 DIAGNOSIS — E119 Type 2 diabetes mellitus without complications: Secondary | ICD-10-CM | POA: Diagnosis not present

## 2023-08-22 DIAGNOSIS — K219 Gastro-esophageal reflux disease without esophagitis: Secondary | ICD-10-CM | POA: Diagnosis not present

## 2023-08-22 DIAGNOSIS — E119 Type 2 diabetes mellitus without complications: Secondary | ICD-10-CM | POA: Diagnosis not present

## 2023-08-22 DIAGNOSIS — Z0000 Encounter for general adult medical examination without abnormal findings: Secondary | ICD-10-CM | POA: Diagnosis not present

## 2023-08-22 DIAGNOSIS — I5022 Chronic systolic (congestive) heart failure: Secondary | ICD-10-CM | POA: Diagnosis not present

## 2023-08-22 DIAGNOSIS — E118 Type 2 diabetes mellitus with unspecified complications: Secondary | ICD-10-CM | POA: Diagnosis not present

## 2023-08-22 DIAGNOSIS — J449 Chronic obstructive pulmonary disease, unspecified: Secondary | ICD-10-CM | POA: Diagnosis not present

## 2023-08-22 DIAGNOSIS — I25118 Atherosclerotic heart disease of native coronary artery with other forms of angina pectoris: Secondary | ICD-10-CM | POA: Diagnosis not present

## 2023-08-22 DIAGNOSIS — E782 Mixed hyperlipidemia: Secondary | ICD-10-CM | POA: Diagnosis not present

## 2023-08-22 DIAGNOSIS — I10 Essential (primary) hypertension: Secondary | ICD-10-CM | POA: Diagnosis not present

## 2023-08-22 DIAGNOSIS — I2510 Atherosclerotic heart disease of native coronary artery without angina pectoris: Secondary | ICD-10-CM | POA: Diagnosis not present

## 2023-09-11 ENCOUNTER — Ambulatory Visit: Payer: PPO | Attending: Cardiovascular Disease

## 2023-09-11 DIAGNOSIS — I350 Nonrheumatic aortic (valve) stenosis: Secondary | ICD-10-CM | POA: Diagnosis not present

## 2023-09-11 LAB — ECHOCARDIOGRAM COMPLETE
AR max vel: 1.4 cm2
AV Area VTI: 1.54 cm2
AV Area mean vel: 1.41 cm2
AV Mean grad: 24.3 mmHg
AV Peak grad: 44.4 mmHg
Ao pk vel: 3.33 m/s
Area-P 1/2: 3.72 cm2
Calc EF: 45.6 %
S' Lateral: 4.6 cm
Single Plane A2C EF: 40.3 %
Single Plane A4C EF: 46.9 %

## 2023-09-13 ENCOUNTER — Other Ambulatory Visit: Payer: Self-pay | Admitting: *Deleted

## 2023-09-13 DIAGNOSIS — I350 Nonrheumatic aortic (valve) stenosis: Secondary | ICD-10-CM

## 2023-09-14 ENCOUNTER — Encounter: Payer: Self-pay | Admitting: Cardiology

## 2023-09-14 ENCOUNTER — Ambulatory Visit: Payer: PPO | Attending: Cardiology | Admitting: Cardiology

## 2023-09-14 VITALS — BP 112/64 | HR 82 | Ht 74.0 in | Wt 267.0 lb

## 2023-09-14 DIAGNOSIS — I2510 Atherosclerotic heart disease of native coronary artery without angina pectoris: Secondary | ICD-10-CM

## 2023-09-14 DIAGNOSIS — I10 Essential (primary) hypertension: Secondary | ICD-10-CM

## 2023-09-14 DIAGNOSIS — I5032 Chronic diastolic (congestive) heart failure: Secondary | ICD-10-CM | POA: Diagnosis not present

## 2023-09-14 DIAGNOSIS — I350 Nonrheumatic aortic (valve) stenosis: Secondary | ICD-10-CM | POA: Diagnosis not present

## 2023-09-14 DIAGNOSIS — I739 Peripheral vascular disease, unspecified: Secondary | ICD-10-CM | POA: Diagnosis not present

## 2023-09-14 DIAGNOSIS — R42 Dizziness and giddiness: Secondary | ICD-10-CM

## 2023-09-14 DIAGNOSIS — E782 Mixed hyperlipidemia: Secondary | ICD-10-CM | POA: Diagnosis not present

## 2023-09-14 NOTE — Progress Notes (Signed)
Cardiology Office Note:  .   Date:  09/14/2023  ID:  Kurt Kelley, DOB 12/08/1946, MRN [id number] PCP: Jerl Mina, MD  [facility] Providers Cardiologist:  Lorine Bears, MD    History of Present Illness: .   Kurt Kelley is a 77 y.o. 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  He underwent a vascular evaluation in May 2019 which showed an ABI of 1.05 on the right and 0.97 on the left.  Duplex showed mild atherosclerosis of the right lower extremity.  On the left there was moderate disease affecting the distal SFA.  He previously undergone left heart catheterization with PCI/DES to the RCA in 2003.  Repeat stress testing in 2018 was nonischemic.  In 2019 he presented with chest pain and NSTEMI was found to have 95% stenosis to the mid left circumflex and patent RCA stent.  Circumflex was treated successfully with PCI/DES.  Echocardiogram showed moderate aortic stenosis with an LVEF of 50-55%.  He was intolerant to beta-blockers and given his prior statin intolerance he was started on Repatha.  Unfortunately was unable to continue therapy due to cost.  He was subsequently started on ezetimibe which he has tolerated since that time.  Unfortunately was not able to stay on dual antiplatelet therapy with Brilinta and aspirin secondary to hematuria.  In November 2020 he had complaints of exertional chest discomfort and underwent stress testing which was considered low risk without ischemia or infarct.  He underwent stress testing again which showed moderately reduced LVEF 35 to 45% correlation with echocardiogram was advised there was some indeterminate risk  due to reduced EF.  Repeat echocardiogram was completed in 05/2021 which revealed an EF of 50-55%, G1 DD, moderate aortic stenosis, mild mitral regurgitation.  Evaluation 06/2021 patient was scheduled for cardiac catheterization patient had to cancel procedure due to family emergency.  On his return visit 09/13/2021 he had undergone spine surgery [facility] with improvement in his symptoms.  He had stopped taking HCTZ due to the development of gout and also had a weight loss of 22 pounds repeat echocardiogram in 05/2022 revealed an LVEF of 40 to 45% which was dropped mildly, G1 DD, mildly elevated pulmonary artery systolic pressure, and mild mitral regurgitation, aortic valve was normal in structure with severe calcification of the aortic valve with moderate aortic valve stenosis with mild aortic regurgitation.  06/15/2022 he was scheduled for right and left heart catheterization and amlodipine was discontinued for the benefit of Entresto and he was scheduled for limited echocardiogram.  Any catheterization completed 07/03/2022 which revealed distal circumflex lesion 40% stenosis, RCA lesion 50% stenosis, proximal LAD to mid LAD lesion 50% stenosis, mild to moderate left ventricular systolic dysfunction, LV systolic pressure was mildly elevated.  Echo in 08/2022 showed an EF of 50 percent with moderate aortic stenosis with a mean gradient of 23 mmHg.  Was last seen in clinic 03/22/2023 by Dr.Arida.  He had complaints of nausea, muscle aches, fatigue tach, chest pain, bilateral LE edema, frequent urination, rapid heartbeats and difficulty sleeping with shortness of breath cough and congestion.  He was continued on Imdur and recommended repeat echocardiogram in 6 months.  He returns to clinic today accompanied by his wife.  He  states overall he has been doing fairly well.  He is having issues with lightheadedness and dizziness that he feels is related to the elevated dose of Entresto.  He previously  had dizziness that improved once coming off of amlodipine.  He has been riding his motorcycle without issues as of late.  Denies any shortness of breath, chest pain, chest tightness, or peripheral edema.  He occasionally was a palpitation that is short-lived.  States that he has been compliant with his current medication regimen.  Denies any hospitalizations or visits to the emergency department.  ROS: 10 point review of systems has been reviewed and considered negative with exception what is been listed in the HPI  Studies Reviewed: Marland Kitchen   EKG Interpretation Date/Time:  Friday September 14 2023 10:30:38 EDT Ventricular Rate:  82 PR Interval:  158 QRS Duration:  98 QT Interval:  366 QTC Calculation: 427 R Axis:   6  Text Interpretation: Sinus rhythm with occasional Premature ventricular complexes When compared with ECG of 09-Jul-2018 04:59, Premature ventricular complexes are now Present Confirmed by [name] ([id number]) on 09/14/2023 10:44:14 AM   TTE 09/11/23 1. Left ventricular ejection fraction, by estimation, is 55 to 60%. The  left ventricle has normal function. The left ventricle has no regional  wall motion abnormalities. The left ventricular internal cavity size was  mildly dilated. Left ventricular  diastolic parameters are consistent with Grade I diastolic dysfunction  (impaired relaxation).   2. Right ventricular systolic function is normal. The right ventricular  size is normal.   3. Left atrial size was mildly dilated.   4. The mitral valve is normal in structure. No evidence of mitral valve  regurgitation. No evidence of mitral stenosis.   5. The aortic valve is calcified. There is moderate calcification of the  aortic valve. Aortic valve regurgitation is mild. Moderate aortic valve  stenosis. Aortic valve area, by VTI measures 1.54 cm. Aortic valve mean  gradient measures 24.2 mmHg. Aortic   valve Vmax measures 3.33 m/s.   6. The inferior vena cava is normal in size with  greater than 50%  respiratory variability, suggesting right atrial pressure of 3 mmHg.   Limited TTE 09/15/22 1. Left ventricular ejection fraction, by estimation, is 50 to 55%. The  left ventricle has low normal function. The left ventricle has no regional  wall motion abnormalities. The left ventricular internal cavity size was  mildly dilated. There is mild  left ventricular hypertrophy. Left ventricular diastolic parameters are  consistent with Grade I diastolic dysfunction (impaired relaxation). The  average left ventricular global longitudinal strain is -12.8 %.   2. Right ventricular systolic function is normal. The right ventricular  size is normal. Tricuspid regurgitation signal is inadequate for assessing  PA pressure.   3. The mitral valve is normal in structure. Mild mitral valve  regurgitation. No evidence of mitral stenosis.   4. The aortic valve is normal in structure. Aortic valve regurgitation is  not visualized. Moderate aortic valve stenosis. Aortic valve mean gradient  measures 23.0 mmHg. Aortic valve Vmax measures 3.09 m/s.   5. The inferior vena cava is normal in size with greater than 50%  respiratory variability, suggesting right atrial pressure of 3 mmHg.   [employer] 07/03/22   Dist Cx lesion is 40% stenosed.   Dist RCA lesion is 50% stenosed.   Prox LAD to Mid LAD lesion is 50% stenosed.   Non-stenotic Mid Cx lesion was previously treated.   Non-stenotic Prox RCA lesion  was previously treated.   There is mild to moderate left ventricular systolic dysfunction.   LV end diastolic pressure is mildly elevated.   1.  Patent RCA and left circumflex stents with no significant restenosis.  Moderate three-vessel coronary artery disease with some progression of mid LAD stenosis but still does not seem to be obstructive. 2.  Mildly to moderately reduced LV systolic function with an EF of 40 to 45%. 3.  Right heart catheterization showed high normal filling pressures, no  significant pulmonary hypertension and normal cardiac output. 4.  Mild aortic stenosis with a mean gradient of 11 mmHg.   Recommendations: Continue aggressive medical therapy for coronary artery disease and HF systolic heart failure.  The patient's volume status appears to be optimal.   Risk Assessment/Calculations:             Physical Exam:   VS:  BP 112/64 (BP Location: Left Arm, Patient Position: Sitting, Cuff Size: Large)   Pulse 82   Ht 6\' 2"  (1.88 m)   Wt 267 lb (121.1 kg)   SpO2 96%   BMI 34.28 kg/m    Wt Readings from Last 3 Encounters:  09/14/23 267 lb (121.1 kg)  05/24/23 255 lb (115.7 kg)  03/22/23 259 lb (117.5 kg)    GEN: Well nourished, well developed in no acute distress NECK: No JVD; No carotid bruits CARDIAC: RRR, II-III/VI systolic murmur without rubs or gallops RESPIRATORY:  Clear to auscultation without rales, wheezing or rhonchi  ABDOMEN: Soft, non-tender, non-distended EXTREMITIES:  No edema; No deformity   ASSESSMENT AND PLAN: .   Coronary artery disease on the native coronary artery with stable angina.  Last heart catheterization was on 06/2022 which revealed RCA and left circumflex stents with no significant in-stent restenosis, moderate 3 vessel coronary artery disease with some progression of mid LAD stenosis and mildly reduced LV systolic function with an EF of 40-45%, right heart catheterization showed normal filling pressures no significant pulmonary hypertension and normal cardiac output.  He currently denies any angina or anginal equivalent.  He has been continued on aspirin 81 mg daily, and Repatha 140 mg every 2 weeks, as well as Imdur 30 mg daily.  EKG today reveals sinus rhythm with a rate of 82 with occasional PVCs ,no further ischemic testing needed at this time.  Chronic HFimpEF with a last echocardiogram completed 09/11/2023 which revealed an LVEF of 55-60%, G1 DD, moderately dilated left atrium, mild aortic regurgitation, moderate aortic valve  stenosis.  He appears to be euvolemic on exam today with NYHA I symptoms.  He does have positive weight gain that he contributes to dietary indiscretion.  With his complaint of dizziness we will reduce his Entresto from 97/23 mg twice daily to 49/51 mg twice daily.  He will be continued on Aldactone 25 mg daily.  He is not currently on beta-blocker therapy due to not being able to tolerate them's in the past.  Will need to revisit SGLT2 inhibitor at return.  Moderate aortic valve stenosis with aortic valve area by VTI measuring 1.54 cm on recent echocardiogram.  Recommendation to continue with serial studies yearly at this time.  Peripheral arterial disease with mildly reduced ABI on the left side with evidence of borderline significant disease affecting the left SFA.  Distal pulses are still palpable without complaints of claudication.  Continue to follow with Dr. [place] Sink for his peripheral disease.  At this point he is continued on aspirin and Repatha.  Hyperlipidemia with intolerance to statin.  LDL was last noted to be TTN he is continued on Repatha 140 mg twice daily.  His ezetimibe and fenofibrate was discontinued at his previous visit.  Primary hypertension with blood pressure today 112/64.  Blood pressures have been stable.  He is continued on his current medication regimen of Imdur and Entresto as well as Aldactone.  Encouraged to continue to monitor his blood pressures 1 to 2 hours postmedication administration at home as well.       Dispo: Patient to return to clinic to see MD/APP in 6 weeks or sooner to reevaluate symptoms after medication changes have been made today.  Patient also stated that he want to let the office know that he was contacted by [facility] for potential study for heart failure and has an upcoming appointment in October to discuss with the primary investigator of the study information related to the study prior to his determination if he wants to be involved.  Signed, Imad Shostak, NP

## 2023-09-14 NOTE — Patient Instructions (Signed)
Medication Instructions:  Your physician has recommended you make the following change in your medication:  sacubitril-valsartan (ENTRESTO) 97-103 MG (cut in half)  *If you need a refill on your cardiac medications before your next appointment, please call your pharmacy*  Lab Work: -None ordered  Testing/Procedures: -None ordered  Follow-Up: At [facility], you and your health needs are our priority.  As part of our continuing mission to provide you with exceptional heart care, we have created designated Provider Care Teams.  These Care Teams include your primary Cardiologist (physician) and Advanced Practice Providers (APPs -  Physician Assistants and Nurse Practitioners) who all work together to provide you with the care you need, when you need it.  Your next appointment:   6 week(s)  Provider:   You may see Lorine Bears, MD or one of the following Advanced Practice Providers on your designated Care Team:   Nicolasa Ducking, NP Eula Listen, PA-C Cadence Fransico Michael, PA-C [name], NP    Other Instructions -None

## 2023-09-27 ENCOUNTER — Other Ambulatory Visit: Payer: Self-pay | Admitting: Cardiovascular Disease

## 2023-10-10 ENCOUNTER — Encounter: Payer: Self-pay | Admitting: Cardiovascular Disease

## 2023-10-10 ENCOUNTER — Other Ambulatory Visit: Payer: Self-pay | Admitting: Cardiovascular Disease

## 2023-10-11 ENCOUNTER — Encounter: Payer: Self-pay | Admitting: Pharmacist

## 2023-10-11 MED ORDER — SPIRONOLACTONE 25 MG PO TABS: 25.0000 mg | ORAL_TABLET | Freq: Every day | ORAL | 1 refills | Status: DC

## 2023-10-24 DIAGNOSIS — I5032 Chronic diastolic (congestive) heart failure: Secondary | ICD-10-CM | POA: Diagnosis not present

## 2023-10-29 ENCOUNTER — Encounter: Payer: Self-pay | Admitting: Cardiology

## 2023-10-29 ENCOUNTER — Ambulatory Visit: Payer: PPO | Attending: Cardiology | Admitting: Cardiology

## 2023-10-29 VITALS — BP 108/60 | HR 76 | Ht 75.0 in | Wt 264.0 lb

## 2023-10-29 DIAGNOSIS — E782 Mixed hyperlipidemia: Secondary | ICD-10-CM

## 2023-10-29 DIAGNOSIS — I350 Nonrheumatic aortic (valve) stenosis: Secondary | ICD-10-CM | POA: Diagnosis not present

## 2023-10-29 DIAGNOSIS — E119 Type 2 diabetes mellitus without complications: Secondary | ICD-10-CM | POA: Diagnosis not present

## 2023-10-29 DIAGNOSIS — I2510 Atherosclerotic heart disease of native coronary artery without angina pectoris: Secondary | ICD-10-CM

## 2023-10-29 DIAGNOSIS — I5032 Chronic diastolic (congestive) heart failure: Secondary | ICD-10-CM | POA: Diagnosis not present

## 2023-10-29 DIAGNOSIS — I739 Peripheral vascular disease, unspecified: Secondary | ICD-10-CM

## 2023-10-29 DIAGNOSIS — I10 Essential (primary) hypertension: Secondary | ICD-10-CM | POA: Diagnosis not present

## 2023-10-29 NOTE — Progress Notes (Signed)
Cardiology Office Note:  .   Date:  10/29/2023  ID:  Kurt Kelley, DOB November 26, 1946, MRN [id number] PCP: Jerl Mina, MD  [facility] Providers Cardiologist:  Lorine Bears, MD    History of Present Illness: .   Kurt Kelley is a 77 y.o. male with a past medical history of coronary artery disease status post prior RCA and circumflex stenting, mild ischemic cardiomyopathy, moderate aortic stenosis, HFrEF 30-45% (05/2019), hypertension, hyperlipidemia (statin intolerant), obesity, chronic low back pain, peripheral arterial disease/claudication, and sleep apnea with CPAP intolerance, who presents today for follow-up of his coronary artery disease.  Previous vascular evaluation in May 2019 showed ABI's of 1.05 on the right and 0.9 cm on the left.  Duplex showed mild atherosclerosis of the right lower extremity.  On the left there was moderate activities affecting the distal SFA.  He previously undergone LHC with PCI/DES to the left circumflex.  Repeat stress testing in 2020 was nonischemic.  Echocardiogram revealed LVEF of 60 to 65%, mildly increased LV wall thickness, G1 DD, moderate calcification of the aortic valve, aortic valve regurgitation is mild, moderate stenosis of aortic valve with a mean gradient of 20 mmHg, peak gradient of 38 mmHg, AVA 1.28 cm.  Repeat echocardiogram done in 2021 revealed LVEF 60 to 65%, no RWMA, G1 DD, moderate aortic valve stenosis, aortic valve area by VTI measures 1.47 cm, mean gradient measured 26 mmHg, aortic valve V-max measured 3.1m/s.  Repeat Myoview completed 03/2021 revealed no evidence of ischemia.  Echocardiogram completed 05/2021 revealed LVEF 50-55%, G1 DD, no RWMA, mild mitral valve regurgitation, mean aortic valve gradient of 27 mmHg, peak gradient 45 mmHg, V-max 3.30m/s, AVA 1.5 cm, moderate aortic valve stenosis.  Echocardiogram 05/2022 revealed a drop in LVEF of 40-45%, with wall motion abnormalities, G1 DD, moderate aortic valve stenosis by VTI  measuring 1.67 cm.  Right and left heart catheterization completed 07/03/2022 revealed patent RCA and left circumflex stent with no significant restenosis.  Moderate three-vessel coronary artery disease with some progression of mid LAD stenosis but still does not seem to be obstructive.  Mild aortic stenosis with a mean gradient of 11 mmHg.  Mild to moderately reduced LV systolic function with an EF of 40-45%.  Limited echocardiogram completed 09/15/2022 revealed LVEF 50-55%, no RWMA, mild LVH, mild MR, moderate aortic valve stenosis, aortic valve mean gradient measured 23 mmHg, aortic valve V-max measured 3.36m/s.  Echocardiogram completed 09/11/2023 revealed an LVEF of 55 to 60%, no RWMA, G1 DD, moderate calcification of the aortic valve, mild AR, moderate aortic valve stenosis, aortic valve area VTI measures 1.54 cm, mean gradient measured 24.2 mmHg, aortic valve V-max measures 3.42m/s.  He was last seen in clinic 09/14/2023.  At that time he been doing fairly well.  Benefits of occasional lightheadedness and dizziness.  Feels it was related to elevated dose of Entresto.  Entresto dosing was reduced from 97/103 to 49/51 mg twice daily.  No further testing was ordered at that time.  He returns to clinic today accompanied by his wife. He continues to have some occasional dizziness and lightheadedness but they are only lasting for short period of time.  He also continues to have occasional nausea without vomiting.  He is continuing to have some claudication symptoms and is requesting if he needed to have repeat ABIs done.  He states he did follow-up at [facility] for potential research study and was advised that he needed to make several medication changes and is still not enrolled  in any type of research study at this time.  He states he will likely cancel his follow-up appointment.  States that he has been compliant with his current medication regimen.  Has cut back on the amount of beer that he drinks. Has started  weighing himself daily again with minimal changes in weight noted. Denies any recent hospitalizations or visits to the emergency department.  ROS: 10 point review of system has been reviewed and considered negative with exception was been listed in the HPI  Studies Reviewed: Marland Kitchen        TTE 09/11/23 1. Left ventricular ejection fraction, by estimation, is 55 to 60%. The  left ventricle has normal function. The left ventricle has no regional  wall motion abnormalities. The left ventricular internal cavity size was  mildly dilated. Left ventricular  diastolic parameters are consistent with Grade I diastolic dysfunction  (impaired relaxation).   2. Right ventricular systolic function is normal. The right ventricular  size is normal.   3. Left atrial size was mildly dilated.   4. The mitral valve is normal in structure. No evidence of mitral valve  regurgitation. No evidence of mitral stenosis.   5. The aortic valve is calcified. There is moderate calcification of the  aortic valve. Aortic valve regurgitation is mild. Moderate aortic valve  stenosis. Aortic valve area, by VTI measures 1.54 cm. Aortic valve mean  gradient measures 24.2 mmHg. Aortic   valve Vmax measures 3.33 m/s.   6. The inferior vena cava is normal in size with greater than 50%  respiratory variability, suggesting right atrial pressure of 3 mmHg.    Limited TTE 09/15/22 1. Left ventricular ejection fraction, by estimation, is 50 to 55%. The  left ventricle has low normal function. The left ventricle has no regional  wall motion abnormalities. The left ventricular internal cavity size was  mildly dilated. There is mild  left ventricular hypertrophy. Left ventricular diastolic parameters are  consistent with Grade I diastolic dysfunction (impaired relaxation). The  average left ventricular global longitudinal strain is -12.8 %.   2. Right ventricular systolic function is normal. The right ventricular  size is normal.  Tricuspid regurgitation signal is inadequate for assessing  PA pressure.   3. The mitral valve is normal in structure. Mild mitral valve  regurgitation. No evidence of mitral stenosis.   4. The aortic valve is normal in structure. Aortic valve regurgitation is  not visualized. Moderate aortic valve stenosis. Aortic valve mean gradient  measures 23.0 mmHg. Aortic valve Vmax measures 3.09 m/s.   5. The inferior vena cava is normal in size with greater than 50%  respiratory variability, suggesting right atrial pressure of 3 mmHg.    [facility] 07/03/22   Dist Cx lesion is 40% stenosed.   Dist RCA lesion is 50% stenosed.   Prox LAD to Mid LAD lesion is 50% stenosed.   Non-stenotic Mid Cx lesion was previously treated.   Non-stenotic Prox RCA lesion was previously treated.   There is mild to moderate left ventricular systolic dysfunction.   LV end diastolic pressure is mildly elevated.   1.  Patent RCA and left circumflex stents with no significant restenosis.  Moderate three-vessel coronary artery disease with some progression of mid LAD stenosis but still does not seem to be obstructive. 2.  Mildly to moderately reduced LV systolic function with an EF of 40 to 45%. 3.  Right heart catheterization showed high normal filling pressures, no significant pulmonary hypertension and normal cardiac output. 4.  Mild aortic stenosis with a mean gradient of 11 mmHg.   Recommendations: Continue aggressive medical therapy for coronary artery disease and HF systolic heart failure.  The patient's volume status appears to be optimal. Risk Assessment/Calculations:             Physical Exam:   VS:  BP 108/60 (BP Location: Left Arm, Patient Position: Sitting, Cuff Size: Large)   Pulse 76   Ht 6\' 3"  (1.905 m)   Wt 264 lb (119.7 kg)   SpO2 98%   BMI 33.00 kg/m    Wt Readings from Last 3 Encounters:  10/29/23 264 lb (119.7 kg)  09/14/23 267 lb (121.1 kg)  05/24/23 255 lb (115.7 kg)    GEN: Well nourished,  well developed in no acute distress NECK: No JVD; No carotid bruits CARDIAC: RRR, II-III/VI systolic murmur without rubs or gallops RESPIRATORY:  Clear to auscultation without rales, wheezing or rhonchi  ABDOMEN: Soft, non-tender, non-distended EXTREMITIES:  No edema; No deformity   ASSESSMENT AND PLAN: .   Peripheral arterial disease with mildly reduced ABI on the left side with evidence of borderline significant disease affecting the left SFA.  Distal pulses are still palpable.  He can continues to have complaints of claudication today.  He has been scheduled for repeat ABIs as his last study was completed in 2019.  He will follow-up after his ABI's with primary cardiologist Dr.Arida.  He has been continued on aspirin 81 mg daily and Repatha.  Coronary artery disease of native coronary artery with stable angina.  Left heart catheterization on 06/2022 which revealed RCA and left circumflex stents with no significant in-stent restenosis, moderate three-vessel coronary artery disease with some progression of the mid LAD stenosis and mildly reduced LV systolic function EF of 40-45%, right heart catheterization showed normal filling pressures with no significant pulmonary hypertension and normal cardiac output.  He currently denies angina or anginal equivalents.  He is continued on aspirin 81 mg daily, Repatha 140 mg every 2 weeks subcutaneously, Imdur 30 mg daily.  Chronic HFimpEF with last LVEF 55 to 60%, G1 DD, moderately dilated left atrium, mild aortic regurgitation, moderate aortic valve stenosis.  Appears to be euvolemic on exam today with NYHA I symptoms.  He has weight gain of 2 pounds and that he will lose it again with his daily weights at home.  He is continued on Entresto 49/51 mg twice daily, Aldactone 25 mg daily.  He is not on beta-blocker therapy due to the inability to tolerate them in the past.  We did revisit starting him on Jardiance today.  His wife wants to fill out the paperwork for  patient assistance of prior to starting Jardiance to ensure they are able to afford it.  When she feels out of the paperwork she will let us know where to send the prescription if they are willing to start the Jardiance after she talks with patient assistance.  Moderate aortic valve stenosis with aortic valve area by the valve area by VTI measuring 1.54 cm on recent echocardiogram done 08/2023.  Will repeat follow-up echocardiogram in 6 months to ensure no progression.  At this time he occasionally has some dizziness and lightheadedness but has not had any palpitations or syncopal episodes.  Will continue to follow with surveillance studies.  Mixed hyperlipidemia with an intolerance to statin therapy.  LDL 10 02/2023.  He is continued on Repatha 140 mg twice monthly,  Primary hypertension with a blood pressure today 108/60.  Blood pressures  have been stable.  He continues on his current medication of abnormal, Entresto, and Aldactone.  He is encouraged to continue to monitor his blood pressure 1 to 2 hours postmedication administrations at home as well.  Type 2 diabetes.  Currently has been managed on Trulicity once weekly.  This continues to be followed by his PCP.        Dispo: Patient to return to clinic to see primary cardiologist after ABI's.  Or sooner if needed.  Signed, Veer Elamin, NP

## 2023-10-29 NOTE — Patient Instructions (Signed)
Medication Instructions:  Your physician recommends that you continue on your current medications as directed. Please refer to the Current Medication list given to you today.  *If you need a refill on your cardiac medications before your next appointment, please call your pharmacy*  Lab Work: - None ordered  Testing/Procedures: Your physician has requested that you have an echocardiogram. Echocardiography is a painless test that uses sound waves to create images of your heart. It provides your doctor with information about the size and shape of your heart and how well your heart's chambers and valves are working. This procedure takes approximately one hour. There are no restrictions for this procedure. Please do NOT wear cologne, perfume, aftershave, or lotions (deodorant is allowed). Please arrive 15 minutes prior to your appointment time.  Please note: We ask at that you not bring children with you during ultrasound (echo/ vascular) testing. Due to room size and safety concerns, children are not allowed in the ultrasound rooms during exams. Our front office staff cannot provide observation of children in our lobby area while testing is being conducted. An adult accompanying a patient to their appointment will only be allowed in the ultrasound room at the discretion of the ultrasound technician under special circumstances. We apologize for any inconvenience.   Your physician has requested that you have an ankle brachial index (ABI). During this test an ultrasound and blood pressure cuff are used to evaluate the arteries that supply the arms and legs with blood. Allow thirty minutes for this exam. There are no restrictions or special instructions.  Please note: We ask at that you not bring children with you during ultrasound (echo/ vascular) testing. Due to room size and safety concerns, children are not allowed in the ultrasound rooms during exams. Our front office staff cannot provide observation  of children in our lobby area while testing is being conducted. An adult accompanying a patient to their appointment will only be allowed in the ultrasound room at the discretion of the ultrasound technician under special circumstances. We apologize for any inconvenience.   Follow-Up: At [facility], you and your health needs are our priority.  As part of our continuing mission to provide you with exceptional heart care, we have created designated Provider Care Teams.  These Care Teams include your primary Cardiologist (physician) and Advanced Practice Providers (APPs -  Physician Assistants and Nurse Practitioners) who all work together to provide you with the care you need, when you need it.  Your next appointment:   3 month(s) Kurt Bears, MD After echocardiogram [name], NP  Other Instructions - Schedule echo for March/April

## 2023-10-30 ENCOUNTER — Telehealth: Payer: Self-pay | Admitting: Cardiovascular Disease

## 2023-10-30 NOTE — Telephone Encounter (Signed)
The patient's wife stated that they will try for assistance and then call back for samples for the patient.

## 2023-10-30 NOTE — Telephone Encounter (Signed)
Patient calling the office for samples of medication:   1.  What medication and dosage are you requesting samples for?  Jardiance  2.  Are you currently out of this medication?   Not started yet    Wife stated S. Hammock was to leave samples for her to pick up for patient to try out this medication.  Wife stated she wants to pick up samples this week and can call to confirm when ready.

## 2023-11-01 ENCOUNTER — Telehealth: Payer: Self-pay | Admitting: Cardiovascular Disease

## 2023-11-01 NOTE — Telephone Encounter (Signed)
Pt's wife dropped off [employer] Forms for medication assistance. Envelope placed in Pam's box.

## 2023-11-02 NOTE — Telephone Encounter (Signed)
Spoke with patients wife and reviewed that application has been completed and that I will proceed with faxing that to the company. Advised that with the new year they are requiring people to apply for extra help with medications and if denied we will send denial letter with application for consideration. Inquired if she would like me to mail original documents back to her or hold them for pick up. She was agreeable with [place] mailing them back to her. She was appreciative for the assistance with no further questions at this time.

## 2023-11-23 ENCOUNTER — Other Ambulatory Visit: Payer: Self-pay | Admitting: Cardiovascular Disease

## 2024-01-29 ENCOUNTER — Ambulatory Visit: Payer: PPO | Attending: Cardiovascular Disease | Admitting: Cardiovascular Disease

## 2024-01-29 ENCOUNTER — Encounter: Payer: Self-pay | Admitting: Cardiovascular Disease

## 2024-01-29 ENCOUNTER — Ambulatory Visit: Payer: PPO

## 2024-01-29 VITALS — BP 132/78 | HR 82 | Ht 74.0 in | Wt 261.2 lb

## 2024-01-29 DIAGNOSIS — I350 Nonrheumatic aortic (valve) stenosis: Secondary | ICD-10-CM

## 2024-01-29 DIAGNOSIS — I5022 Chronic systolic (congestive) heart failure: Secondary | ICD-10-CM

## 2024-01-29 DIAGNOSIS — I10 Essential (primary) hypertension: Secondary | ICD-10-CM | POA: Diagnosis not present

## 2024-01-29 DIAGNOSIS — R002 Palpitations: Secondary | ICD-10-CM

## 2024-01-29 DIAGNOSIS — I2510 Atherosclerotic heart disease of native coronary artery without angina pectoris: Secondary | ICD-10-CM | POA: Diagnosis not present

## 2024-01-29 DIAGNOSIS — E785 Hyperlipidemia, unspecified: Secondary | ICD-10-CM | POA: Diagnosis not present

## 2024-01-29 DIAGNOSIS — I739 Peripheral vascular disease, unspecified: Secondary | ICD-10-CM

## 2024-01-29 NOTE — Patient Instructions (Signed)
Medication Instructions:  No changes *If you need a refill on your cardiac medications before your next appointment, please call your pharmacy*   Lab Work: None ordered If you have labs (blood work) drawn today and your tests are completely normal, you will receive your results only by: MyChart Message (if you have MyChart) OR A paper copy in the mail If you have any lab test that is abnormal or we need to change your treatment, we will call you to review the results.   Testing/Procedures: None ordered   Follow-Up: At [facility], you and your health needs are our priority.  As part of our continuing mission to provide you with exceptional heart care, we have created designated Provider Care Teams.  These Care Teams include your primary Cardiologist (physician) and Advanced Practice Providers (APPs -  Physician Assistants and Nurse Practitioners) who all work together to provide you with the care you need, when you need it.  We recommend signing up for the patient portal called "MyChart".  Sign up information is provided on this After Visit Summary.  MyChart is used to connect with patients for Virtual Visits (Telemedicine).  Patients are able to view lab/test results, encounter notes, upcoming appointments, etc.  Non-urgent messages can be sent to your provider as well.   To learn more about what you can do with MyChart, go to [email or web address].    Your next appointment:   4 month(s)  Provider:   You may see Lorine Bears, MD or one of the following Advanced Practice Providers on your designated Care Team:   Nicolasa Ducking, NP Eula Listen, PA-C Cadence Fransico Michael, PA-C [name], NP Carlos Levering, NP    Other Instructions Christena Deem- Long Term Monitor Instructions  Your physician has requested you wear a ZIO patch monitor for 3 days.  This is a single patch monitor. [employer] supplies one patch monitor per enrollment. Additional stickers are not available.  Please do not apply patch if you will be having a Nuclear Stress Test,  Echocardiogram, Cardiac CT, MRI, or Chest Xray during the period you would be wearing the  monitor. The patch cannot be worn during these tests. You cannot remove and re-apply the  ZIO XT patch monitor.  Your ZIO patch monitor will be mailed 3 day USPS to your address on file. It may take 3-5 days  to receive your monitor after you have been enrolled.  Once you have received your monitor, please review the enclosed instructions. Your monitor  has already been registered assigning a specific monitor serial # to you.  Billing and Patient Assistance Program Information  We have supplied [employer] with any of your insurance information on file for billing purposes. [employer] offers a sliding scale Patient Assistance Program for patients that do not have  insurance, or whose insurance does not completely cover the cost of the ZIO monitor.  You must apply for the Patient Assistance Program to qualify for this discounted rate.  To apply, please call [employer] at [phone], select option 4, select option 2, ask to apply for  Patient Assistance Program. Meredeth Ide will ask your household income, and how many people  are in your household. They will quote your out-of-pocket cost based on that information.  [employer] will also be able to set up a 60-month, interest-free payment plan if needed.  Applying the monitor   Shave hair from upper left chest.  Hold abrader disc by orange tab. Rub abrader in 40 strokes over the upper left chest  as  indicated in your monitor instructions.  Clean area with 4 enclosed alcohol pads. Let dry.  Apply patch as indicated in monitor instructions. Patch will be placed under collarbone on left  side of chest with arrow pointing upward.  Rub patch adhesive wings for 2 minutes. Remove white label marked "1". Remove the white  label marked "2". Rub patch adhesive wings for 2 additional minutes.  While  looking in a mirror, press and release button in center of patch. A small green light will  flash 3-4 times. This will be your only indicator that the monitor has been turned on.  Do not shower for the first 24 hours. You may shower after the first 24 hours.  Press the button if you feel a symptom. You will hear a small click. Record Date, Time and  Symptom in the Patient Logbook.  When you are ready to remove the patch, follow instructions on the last 2 pages of Patient  Logbook. Stick patch monitor onto the last page of Patient Logbook.  Place Patient Logbook in the blue and white box. Use locking tab on box and tape box closed  securely. The blue and white box has prepaid postage on it. Please place it in the mailbox as  soon as possible. Your physician should have your test results approximately 7 days after the  monitor has been mailed back to [facility].  Call [facility] Customer Care at [phone] if you have questions regarding  your ZIO XT patch monitor. Call them immediately if you see an orange light blinking on your  monitor.  If your monitor falls off in less than 4 days, contact our Monitor department at [phone].  If your monitor becomes loose or falls off after 4 days call [employer] at [phone] for  suggestions on securing your monitor

## 2024-01-29 NOTE — Progress Notes (Signed)
 Cardiology Office Note   Date:  01/29/2024   ID:  Kurt Kelley, Kurt Kelley 09/16/1946, MRN [id number]  PCP:  Valora Agent, MD  Cardiologist:  Deatrice Cage, MD   Chief Complaint  Patient presents with   Follow-up    Patient reports not feeling well for last 2 months and brings in a list of concerns today.  Symptoms include, productive cough, dizzy, and increasing PVC episodes.  Episodes of feeling drained/numb that will last about 5 minutes.        History of Present Illness: Kurt Kelley is a 78 y.o. male who is here today for a follow-up visit regarding coronary artery disease, chronic systolic heart failure and aortic stenosis. The patient has known history of coronary artery disease with previous stent placement in 2003, chronic systolic heart failure due to mild ischemic cardiomyopathy, aortic stenosis, essential hypertension, hyperlipidemia, sleep apnea and obesity.  He has known history of intolerance to statins due to severe myalgia.  He also reports intolerance to CPAP due to claustrophobia.    The patient was hospitalized in July 2019 with non-ST elevation myocardial infarction.  Cardiac catheterization showed mild LAD disease, 95% stenosis in the mid left circumflex, patent RCA stent and 60% distal RCA stenosis.  I performed successful angioplasty and drug-eluting stent placement to the left circumflex.  There was evidence of moderate aortic stenosis with a peak gradient of 15 to 20 mmHg.  EF was 50 to 55%.  The patient did not tolerate beta-blockers.   He had hematuria on dual antiplatelet therapy that improved after discontinuing Brilinta . He did undergo cystoscopy which showed marked BPH felt to be the source of hematuria.    He had back surgery done in 2022 at [facility] with improvement in back pain. Hydrochlorothiazide was discontinued due to gout.  He was seen in March and reported continued episodes of shortness of breath and intermittent chest pain.  He had an echocardiogram  done in June which showed an EF of 40 to 45% with mild pulmonary hypertension and mild mitral regurgitation.  Aortic stenosis was moderate.  Most recent cardiac catheterization in July 2023 showed patent RCA and left circumflex stents with no significant restenosis.  There was moderate three-vessel coronary artery disease with some progression of mid LAD stenosis but did not appear obstructive.  EF was 40 to 45%.  Right heart catheterization showed high normal filling pressures, no significant pulmonary hypertension and normal cardiac output.  Aortic stenosis was mild with a mean gradient of 11 mmHg.  Most recent echocardiogram in September 2024 showed normal LV systolic function with stable moderate aortic stenosis.  He was seen in our office in November for increased fatigue and occasional dizziness and lightheadedness.  He was also concerned about leg discomfort.  He is scheduled for an ABI.  He has multiple complaints today including fatigue, sudden generalized numbness feeling with confusion, increased palpitations with associated dizziness but no syncope.  He reports playing collision sports growing up.   Past Medical History:  Diagnosis Date   Aortic stenosis    a. 04/2018 Echo: mild to mod AS w/ mild AI; b. 05/2020 Echo: Mod AS; c. 05/2021 Echo: Mod AS; d. 05/2022 Echo: EF 40-45%, Mod AS (AVA 1.67 cm^2 - VTI, mean grad ).   Arthritis    lower back, right knee   CAD S/P percutaneous coronary angioplasty 2003   a. remote PCI in 2003; b. Myoview  10/18 no ischemia, EF 39%; b. 06/2018 NSTEMI/PCI: LM nl, LAD  20p, LCX 44m (2.5x15 Resolute Onyx DES), RCA patent stent prox, 60d, EF 50-55%; c. 10/2019 MV: No isch/infarct; d. 03/2021 MV: EF 30-44%, no ischemia.   Chronic combined systolic (congestive) and diastolic (congestive) heart failure (HCC)    a. 04/2018 Echo: EF to 50-55%, Gr1DD; b. 05/2020 Echo: EF 60-65%, Gr1 DD; c. 05/2021 Echo: EF 50-55%, GrI DD; d. 05/2022 Echo: EF 40-45%, inf/septal HK, GrI  DD.   Diabetes mellitus type II, controlled (HCC)    HLD (hyperlipidemia)    a. intolerant to statins   Hypertension    Ischemic cardiomyopathy    a. 04/2018 Echo: EF to 50-55%, no RWMA, Gr1DD, mild to moderate aortic stenosis with mild aortic insufficiency; b. 05/2020 Echo: EF 60-65%, no rwma, Gr1 DD, nl RV size/fxn, mod dil LA. Mod AS; c. 05/2021 Echo: EF 50-55%, GrI DD; d. 05/2022 Echo: EF 40-45%, inf/septal HK, GrI DD, RVSP 37.7mmHg, mod dil LA, mild MR, mild AI, mod AS.   Myocardial infarction (HCC) 06/2018   PAD (peripheral artery disease) (HCC)    Sleep apnea    a. intolerant of CPAP    Past Surgical History:  Procedure Laterality Date   ARTHRODESIS POSTERIOR INTERBODY LUMBAR       CARDIAC CATHETERIZATION  2003   stent   CARPAL TUNNEL RELEASE Right 10/24/2017   Procedure: CARPAL TUNNEL RELEASE ENDOSCOPIC;  Surgeon: Edie Norleen PARAS, MD;  Location: [facility];  Service: Orthopedics;  Laterality: Right;  sleep apnea   CERVICAL SPINE SURGERY Right 01/23/2017   arthrodesis, discectomy, osteophytectomy, decompression  C3-C4, [facility]   COLONOSCOPY     CORONARY STENT INTERVENTION N/A 07/08/2018   Procedure: CORONARY STENT INTERVENTION;  Surgeon: Darron Deatrice LABOR, MD;  Location: [facility];  Service: Cardiovascular;  Laterality: N/A;   HOLEP-LASER ENUCLEATION OF THE PROSTATE WITH MORCELLATION N/A 03/12/2020   Procedure: HOLEP-LASER ENUCLEATION OF THE PROSTATE WITH MORCELLATION;  Surgeon: Francisca Redell BROCKS, MD;  Location: [facility] OR[facility];  Service: Urology;  Laterality: N/A;   JOINT REPLACEMENT Left    tkr   KNEE SURGERY Right 1966   LEFT HEART CATH AND CORONARY ANGIOGRAPHY N/A 07/08/2018   Procedure: LEFT HEART CATH AND CORONARY ANGIOGRAPHY;  Surgeon: Darron Deatrice LABOR, MD;  Location: [facility];  Service: Cardiovascular;  Laterality: N/A;   REPLACEMENT TOTAL KNEE Left 2005   RIGHT/LEFT HEART CATH AND CORONARY ANGIOGRAPHY Bilateral 07/03/2022   Procedure: RIGHT/LEFT HEART  CATH AND CORONARY ANGIOGRAPHY;  Surgeon: Darron Deatrice LABOR, MD;  Location: [facility];  Service: Cardiovascular;  Laterality: Bilateral;     Current Outpatient Medications  Medication Sig Dispense Refill   acetaminophen  (TYLENOL ) 500 MG tablet Take 1,000 mg by mouth every 8 (eight) hours as needed for moderate pain.     albuterol  (PROVENTIL  HFA;VENTOLIN  HFA) 108 (90 Base) MCG/ACT inhaler Inhale 2 puffs into the lungs daily.     aspirin  81 MG tablet Take 81 mg by mouth at bedtime.      colchicine 0.6 MG tablet Take 0.6 mg by mouth as needed.     Evolocumab  (REPATHA  SURECLICK) 140 MG/ML SOAJ Inject 140 mg into the skin every 14 (fourteen) days. 2 mL 11   fluticasone (FLONASE) 50 MCG/ACT nasal spray Place 1 spray into both nostrils at bedtime.     isosorbide  mononitrate (IMDUR ) 30 MG 24 hr tablet TAKE 1 TABLET BY MOUTH EVERY DAY 90 tablet 0   MYRBETRIQ  50 MG TB24 tablet TAKE 1 TABLET (50 MG TOTAL) BY MOUTH DAILY  AS NEEDED (OVERACTIVE BLADDER SYMPTOMS/LEAKAGE). 30 tablet 11   Naphazoline HCl (CLEAR EYES OP) Place 1 drop into both eyes daily as needed (redness).     nitroGLYCERIN  (NITROSTAT ) 0.4 MG SL tablet Place 1 tablet (0.4 mg total) under the tongue every 5 (five) minutes as needed for chest pain. 25 tablet 0   omeprazole (PRILOSEC) 20 MG capsule Take 1 tablet by mouth daily as needed.     sacubitril -valsartan  (ENTRESTO ) 97-103 MG TAKE 1 TABLET BY MOUTH TWICE A DAY (Patient taking differently: Take 0.5 tablets by mouth 2 (two) times daily.) 60 tablet 0   spironolactone  (ALDACTONE ) 25 MG tablet Take 1 tablet (25 mg total) by mouth daily. 90 tablet 1   TRULICITY 0.75 MG/0.5ML SOPN Inject 0.75 mg into the skin every Monday.     No current facility-administered medications for this visit.    Allergies:   Capsicum, Ticagrelor , and Ampicillin    Social History:  The patient  reports that he quit smoking about 37 years ago. His smoking use included cigarettes. He has been exposed to  tobacco smoke. He has never used smokeless tobacco. He reports current alcohol use of about 11.0 standard drinks of alcohol per week. He reports that he does not currently use drugs.   Family History:  The patient's family history includes Alzheimer's disease in his mother; CAD in his father; Diabetes in his mother; Kidney cancer in his father; Lung cancer in his father.    ROS:  Please see the history of present illness.   Otherwise, review of systems are positive for none.   All other systems are reviewed and negative.    PHYSICAL EXAM: VS:  BP 132/78 (BP Location: Left Arm, Patient Position: Sitting, Cuff Size: Large)   Pulse 82   Ht 6' 2 (1.88 m)   Wt 261 lb 3.2 oz (118.5 kg)   SpO2 97%   BMI 33.54 kg/m  , BMI Body mass index is 33.54 kg/m. GEN: Well nourished, well developed, in no acute distress  HEENT: normal  Neck: no JVD, carotid bruits, or masses Cardiac: RRR; no rubs, or gallops,no edema . 2 /6 systolic ejection murmur in the aortic area which is mid peaking with mildly diminished S2 Respiratory:  clear to auscultation bilaterally, normal work of breathing GI: soft, nontender, nondistended, + BS MS: no deformity or atrophy  Skin: warm and dry, no rash Neuro:  Strength and sensation are intact Psych: euthymic mood, full affect Radial pulses normal.   EKG:  EKG is ordered today. The EKG today demonstrates : Normal sinus rhythm Normal ECG When compared with ECG of 14-Sep-2023 10:30, Premature ventricular complexes are no longer Present    Recent Labs: No results found for requested labs within last 365 days.    Lipid Panel    Component Value Date/Time   CHOL 169 09/21/2022 1036   TRIG 154 (H) 09/21/2022 1036   HDL 38 (L) 09/21/2022 1036   CHOLHDL 4.4 09/21/2022 1036   VLDL 31 09/21/2022 1036   LDLCALC 100 (H) 09/21/2022 1036      Wt Readings from Last 3 Encounters:  01/29/24 261 lb 3.2 oz (118.5 kg)  10/29/23 264 lb (119.7 kg)  09/14/23 267 lb (121.1  kg)          04/09/2018    2:57 PM  PAD Screen  Previous PAD dx? No  Previous surgical procedure? No  Pain with walking? Yes  Subsides with rest? No  Feet/toe relief with dangling? No  Painful,  non-healing ulcers? No  Extremities discolored? Yes      ASSESSMENT AND PLAN:  1.  Coronary artery disease involving native coronary arteries with stable angina: Cardiac catheterization in 2023 showed patent RCA and left circumflex stents with moderate nonobstructive disease.  Continue medical therapy.  He did not tolerate beta-blockers in the past.  Continue Imdur .    2. Peripheral arterial disease: The patient has mildly reduced ABI on the left side with evidence of borderline significant disease affecting the left SFA.  He is scheduled for a follow-up ABI.  3.   Moderate aortic stenosis: Stable on most recent echocardiogram.  4.  Chronic systolic heart failure with improved ejection fraction: He is intolerant to beta-blockers due to fatigue.  Continue Entresto  and spironolactone .  Most recent ejection fraction was normal.  5.  Hyperlipidemia: Intolerance to statins.  Doing well on Repatha  with most recent LDL of 10.  6.  Essential hypertension: Blood pressure is controlled on current medications.  7.  Increased palpitations and dizziness: I requested a 2-week ZIO monitor.  He also has multiple other complaints that do not seem to be cardiac in origin.  Some of his symptoms seem to be neurologic and he might benefit from neurology evaluation.   Disposition:   FU with me in 4 months     Signed, Deatrice Cage, MD 01/29/24 [facility] [place], [place] [phone]

## 2024-02-04 DIAGNOSIS — R002 Palpitations: Secondary | ICD-10-CM

## 2024-02-14 DIAGNOSIS — R002 Palpitations: Secondary | ICD-10-CM | POA: Diagnosis not present

## 2024-02-23 ENCOUNTER — Other Ambulatory Visit: Payer: Self-pay | Admitting: Cardiovascular Disease

## 2024-02-26 ENCOUNTER — Other Ambulatory Visit: Payer: PPO

## 2024-02-28 ENCOUNTER — Ambulatory Visit: Payer: PPO | Admitting: Cardiology

## 2024-03-14 ENCOUNTER — Ambulatory Visit (INDEPENDENT_AMBULATORY_CARE_PROVIDER_SITE_OTHER): Payer: PPO

## 2024-03-14 ENCOUNTER — Ambulatory Visit: Payer: PPO | Attending: Cardiology

## 2024-03-14 DIAGNOSIS — I350 Nonrheumatic aortic (valve) stenosis: Secondary | ICD-10-CM

## 2024-03-14 DIAGNOSIS — I739 Peripheral vascular disease, unspecified: Secondary | ICD-10-CM

## 2024-03-14 LAB — VAS US ABI WITH/WO TBI
Left ABI: 1.1
Right ABI: 1.06

## 2024-03-14 LAB — ECHOCARDIOGRAM COMPLETE
AR max vel: 1.11 cm2
AV Area VTI: 1.22 cm2
AV Area mean vel: 1.14 cm2
AV Mean grad: 21.8 mmHg
AV Peak grad: 38.9 mmHg
Ao pk vel: 3.12 m/s
Area-P 1/2: 2.69 cm2
Calc EF: 57 %
Single Plane A2C EF: 57 %
Single Plane A4C EF: 57.9 %

## 2024-03-17 NOTE — Progress Notes (Signed)
 Heart squeeze was noted 55 to 60% which is normal function.  There were no wall motion abnormalities noted.  There was mild thickness noted on the left lower chamber of the heart and some stiffness of the muscle likely from age and or high blood pressure.  Moderate narrowing of the aortic valve opening stable.  Recommend continuing with yearly echocardiograms.

## 2024-03-17 NOTE — Progress Notes (Signed)
 Right leg ABIs appear essentially unchanged compared to prior study from 04/2018.  Left leg ABIs are slightly increased from prior study. Continue with ambulation and increase as tolerated. Will discuss symptoms further on return.

## 2024-03-18 ENCOUNTER — Ambulatory Visit: Payer: PPO | Attending: Cardiology | Admitting: Cardiology

## 2024-03-18 ENCOUNTER — Encounter: Payer: Self-pay | Admitting: Cardiology

## 2024-03-18 VITALS — BP 140/70 | HR 78 | Ht 75.0 in | Wt 260.0 lb

## 2024-03-18 DIAGNOSIS — I10 Essential (primary) hypertension: Secondary | ICD-10-CM | POA: Diagnosis not present

## 2024-03-18 DIAGNOSIS — I255 Ischemic cardiomyopathy: Secondary | ICD-10-CM

## 2024-03-18 DIAGNOSIS — I5022 Chronic systolic (congestive) heart failure: Secondary | ICD-10-CM | POA: Diagnosis not present

## 2024-03-18 DIAGNOSIS — E118 Type 2 diabetes mellitus with unspecified complications: Secondary | ICD-10-CM | POA: Diagnosis not present

## 2024-03-18 DIAGNOSIS — I739 Peripheral vascular disease, unspecified: Secondary | ICD-10-CM

## 2024-03-18 DIAGNOSIS — I2510 Atherosclerotic heart disease of native coronary artery without angina pectoris: Secondary | ICD-10-CM | POA: Diagnosis not present

## 2024-03-18 DIAGNOSIS — I350 Nonrheumatic aortic (valve) stenosis: Secondary | ICD-10-CM

## 2024-03-18 DIAGNOSIS — R42 Dizziness and giddiness: Secondary | ICD-10-CM

## 2024-03-18 DIAGNOSIS — Z794 Long term (current) use of insulin: Secondary | ICD-10-CM

## 2024-03-18 DIAGNOSIS — E782 Mixed hyperlipidemia: Secondary | ICD-10-CM | POA: Diagnosis not present

## 2024-03-18 NOTE — Patient Instructions (Addendum)
 Medication Instructions:  Your physician recommends that you continue on your current medications as directed. Please refer to the Current Medication list given to you today.  *If you need a refill on your cardiac medications before your next appointment, please call your pharmacy*   Lab Work: No labs ordered today  If you have labs (blood work) drawn today and your tests are completely normal, you will receive your results only by: MyChart Message (if you have MyChart) OR A paper copy in the mail If you have any lab test that is abnormal or we need to change your treatment, we will call you to review the results.   Testing/Procedures: Your physician has requested that you have an echocardiogram. Echocardiography is a painless test that uses sound waves to create images of your heart. It provides your doctor with information about the size and shape of your heart and how well your heart's chambers and valves are working.   You may receive an ultrasound enhancing agent through an IV if needed to better visualize your heart during the echo. This procedure takes approximately one hour.  There are no restrictions for this procedure.  This will take place at [address] [facility] [address]) [address], [place] [address]  Please note: We ask at that you not bring children with you during ultrasound (echo/ vascular) testing. Due to room size and safety concerns, children are not allowed in the ultrasound rooms during exams. Our front office staff cannot provide observation of children in our lobby area while testing is being conducted. An adult accompanying a patient to their appointment will only be allowed in the ultrasound room at the discretion of the ultrasound technician under special circumstances. We apologize for any inconvenience.    Your physician has requested that you have a carotid duplex. This test is an ultrasound of the carotid arteries in your neck. It looks at blood flow  through these arteries that supply the brain with blood.   Allow one hour for this exam.  There are no restrictions or special instructions.  This will take place at [address] [facility] [address]) [address], [place] [address]  Please note: We ask at that you not bring children with you during ultrasound (echo/ vascular) testing. Due to room size and safety concerns, children are not allowed in the ultrasound rooms during exams. Our front office staff cannot provide observation of children in our lobby area while testing is being conducted. An adult accompanying a patient to their appointment will only be allowed in the ultrasound room at the discretion of the ultrasound technician under special circumstances. We apologize for any inconvenience.    Follow-Up: At [facility], you and your health needs are our priority.  As part of our continuing mission to provide you with exceptional heart care, we have created designated Provider Care Teams.  These Care Teams include your primary Cardiologist (physician) and Advanced Practice Providers (APPs -  Physician Assistants and Nurse Practitioners) who all work together to provide you with the care you need, when you need it.    Your next appointment:   6 week(s)  Provider:   You may see Lorine Bears, MD or one of the following Advanced Practice Providers on your designated Care Team:   Nicolasa Ducking, NP Eula Listen, PA-C Cadence Fransico Michael, PA-C [name], NP Carlos Levering, NP    Other Instructions Echocardiogram  schedule in September 2025 , call back in May 2025   We sent a  referral to Structural heart [facility]

## 2024-03-18 NOTE — Progress Notes (Signed)
 Cardiology Office Note:  .   Date:  03/18/2024  ID:  [name], DOB [birth year], MRN [id number] PCP: Jerl Mina, MD  [facility] Providers Cardiologist:  Lorine Bears, MD    History of Present Illness: .   Kurt Kelley is a 78 y.o. male with a past medical history of coronary disease status post prior RCA and circumflex stenting, nonischemic cardiomyopathy, moderate aortic stenosis, HF IMP EF, hypertension, hyperlipidemia (statin intolerant), obesity, chronic low back pain, peripheral arterial disease/claudication, sleep apnea with CPAP intolerance, who is here today to follow-up on his coronary artery disease.     Previous vascular evaluation in May 2019 showed ABI's of 1.05 on the right and 0.9 cm on the left.  Duplex showed mild atherosclerosis of the right lower extremity.  On the left there was moderate activities affecting the distal SFA.  He previously undergone LHC with PCI/DES to the left circumflex.  Repeat stress testing in 2020 was nonischemic.  Echocardiogram revealed LVEF of 60 to 65%, mildly increased LV wall thickness, G1 DD, moderate calcification of the aortic valve, aortic valve regurgitation is mild, moderate stenosis of aortic valve with a mean gradient of 20 mmHg, peak gradient of 38 mmHg, AVA 1.28 cm.  Repeat echocardiogram done in 2021 revealed LVEF 60 to 65%, no RWMA, G1 DD, moderate aortic valve stenosis, aortic valve area by VTI measures 1.47 cm, mean gradient measured 26 mmHg, aortic valve V-max measured 3.26m/s.  Repeat Myoview completed 03/2021 revealed no evidence of ischemia.  Echocardiogram completed 05/2021 revealed LVEF 50-55%, G1 DD, no RWMA, mild mitral valve regurgitation, mean aortic valve gradient of 27 mmHg, peak gradient 45 mmHg, V-max 3.66m/s, AVA 1.5 cm, moderate aortic valve stenosis.  Echocardiogram 05/2022 revealed a drop in LVEF of 40-45%, with wall motion abnormalities, G1 DD, moderate aortic valve stenosis by VTI measuring 1.67 cm.   Right and left heart catheterization completed 07/03/2022 revealed patent RCA and left circumflex stent with no significant restenosis.  Moderate three-vessel coronary artery disease with some progression of mid LAD stenosis but still does not seem to be obstructive.  Mild aortic stenosis with a mean gradient of 11 mmHg.  Mild to moderately reduced LV systolic function with an EF of 40-45%.  Limited echocardiogram completed 09/15/2022 revealed LVEF 50-55%, no RWMA, mild LVH, mild MR, moderate aortic valve stenosis, aortic valve mean gradient measured 23 mmHg, aortic valve V-max measured 3.83m/s.  Echocardiogram completed 09/11/2023 revealed an LVEF of 55 to 60%, no RWMA, G1 DD, moderate calcification of the aortic valve, mild AR, moderate aortic valve stenosis, aortic valve area VTI measures 1.54 cm, mean gradient measured 24.2 mmHg, aortic valve V-max measures 3.62m/s.    He was last seen in clinic 2//25 by Dr.Arida.  Reported he not been feeling well for the last 2 months and had a list of concerns.  He had complaints of fatigue, sudden generalized numbness feeling with confusion, increased palpitations, associated dizziness but no syncope.  He was placed on a ZIO monitor for 2 weeks and advised to follow-up with neurology.  He returns to clinic today accompanied by his wife.  He occasionally continues to have dizziness and lightheadedness but for short period of times.  He also continues to have occasional nausea without vomiting.  He does complain of numbness and tingling that feels like his toes are numb which are starting to impact his balance.  He recently had undergone ABIs that have not changed significantly since 2019.  He does note that  he has increased amount of fatigue.  He denies any syncope or near syncope.  States that he has been compliant with his current medication regimen.  Denies any recent hospitalizations or visits to the emergency department.  ROS: 10 point review of system has been  reviewed and considered negative with exception was been listed in the HPI  Studies Reviewed: .       2D echo 03/14/2024 1. Left ventricular ejection fraction, by estimation, is 55 to 60%. The  left ventricle has normal function. The left ventricle has no regional  wall motion abnormalities. There is mild left ventricular hypertrophy.  Left ventricular diastolic parameters  are consistent with Grade I diastolic dysfunction (impaired relaxation).   2. Right ventricular systolic function is normal. The right ventricular  size is normal.   3. The mitral valve is normal in structure. No evidence of mitral valve  regurgitation.   4. The aortic valve is calcified. Aortic valve regurgitation is mild.  Moderate aortic valve stenosis. Aortic valve area, by VTI measures 1.22  cm. Aortic valve mean gradient measures 21.8 mmHg. Aortic valve Vmax  measures 3.12 m/s.   5. The inferior vena cava is normal in size with greater than 50%  respiratory variability, suggesting right atrial pressure of 3 mmHg.   Comparison(s): Previous AV meas: max PG, mean.   Event Monitor (Zio) 02/14/24 Patient had a min HR of 45 bpm, max HR of 169 bpm, and avg HR of 72 bpm. Predominant underlying rhythm was Sinus Rhythm. 2 Supraventricular Tachycardia runs occurred, the run with the fastest interval lasting 4 beats with a max rate of 169 bpm, the  longest lasting 4 beats with an avg rate of 105 bpm.  Occasional PVCs with a burden of 1.9%.  TTE 09/11/23 1. Left ventricular ejection fraction, by estimation, is 55 to 60%. The  left ventricle has normal function. The left ventricle has no regional  wall motion abnormalities. The left ventricular internal cavity size was  mildly dilated. Left ventricular  diastolic parameters are consistent with Grade I diastolic dysfunction  (impaired relaxation).   2. Right ventricular systolic function is normal. The right ventricular  size is normal.   3. Left atrial  size was mildly dilated.   4. The mitral valve is normal in structure. No evidence of mitral valve  regurgitation. No evidence of mitral stenosis.   5. The aortic valve is calcified. There is moderate calcification of the  aortic valve. Aortic valve regurgitation is mild. Moderate aortic valve  stenosis. Aortic valve area, by VTI measures 1.54 cm. Aortic valve mean  gradient measures 24.2 mmHg. Aortic   valve Vmax measures 3.33 m/s.   6. The inferior vena cava is normal in size with greater than 50%  respiratory variability, suggesting right atrial pressure of 3 mmHg.    Limited TTE 09/15/22 1. Left ventricular ejection fraction, by estimation, is 50 to 55%. The  left ventricle has low normal function. The left ventricle has no regional  wall motion abnormalities. The left ventricular internal cavity size was  mildly dilated. There is mild  left ventricular hypertrophy. Left ventricular diastolic parameters are  consistent with Grade I diastolic dysfunction (impaired relaxation). The  average left ventricular global longitudinal strain is -12.8 %.   2. Right ventricular systolic function is normal. The right ventricular  size is normal. Tricuspid regurgitation signal is inadequate for assessing  PA pressure.   3. The mitral valve is normal in structure. Mild mitral valve  regurgitation. No evidence of mitral stenosis.   4. The aortic valve is normal in structure. Aortic valve regurgitation is  not visualized. Moderate aortic valve stenosis. Aortic valve mean gradient  measures 23.0 mmHg. Aortic valve Vmax measures 3.09 m/s.   5. The inferior vena cava is normal in size with greater than 50%  respiratory variability, suggesting right atrial pressure of 3 mmHg.    [facility] 07/03/22   Dist Cx lesion is 40% stenosed.   Dist RCA lesion is 50% stenosed.   Prox LAD to Mid LAD lesion is 50% stenosed.   Non-stenotic Mid Cx lesion was previously treated.   Non-stenotic Prox RCA lesion was  previously treated.   There is mild to moderate left ventricular systolic dysfunction.   LV end diastolic pressure is mildly elevated.   1.  Patent RCA and left circumflex stents with no significant restenosis.  Moderate three-vessel coronary artery disease with some progression of mid LAD stenosis but still does not seem to be obstructive. 2.  Mildly to moderately reduced LV systolic function with an EF of 40 to 45%. 3.  Right heart catheterization showed high normal filling pressures, no significant pulmonary hypertension and normal cardiac output. 4.  Mild aortic stenosis with a mean gradient of 11 mmHg.   Recommendations: Continue aggressive medical therapy for coronary artery disease and HF systolic heart failure.  The patient's volume status appears to be optimal. Risk Assessment/Calculations:          Physical Exam:   VS:  BP (!) 140/70   Pulse 78   Ht 6\' 3"  (1.905 m)   Wt 260 lb (117.9 kg)   SpO2 97%   BMI 32.50 kg/m    Wt Readings from Last 3 Encounters:  03/18/24 260 lb (117.9 kg)  01/29/24 261 lb 3.2 oz (118.5 kg)  10/29/23 264 lb (119.7 kg)    GEN: Well nourished, well developed in no acute distress NECK: No JVD; heart murmur radiates into the left and right carotid CARDIAC: RRR, III/VI systolic murmur R/LSB without rubs or gallops RESPIRATORY:  Clear to auscultation without rales, wheezing or rhonchi  ABDOMEN: Soft, non-tender, non-distended EXTREMITIES: Trace pretibial edema; No deformity   ASSESSMENT AND PLAN: .   Lightheaded dizziness without syncope.  Patient states his symptoms are gradually getting worse and he has noted that his improved and becoming lifestyle altering over the last 6 months.  He is concerned that with his aortic valve stenosis with a change in his valve gradient that his symptoms are related to his valve.  He is also being scheduled for an updated carotid duplex.  Recent ZIO monitor revealed no significant arrhythmias and occasional PVCs with  a burden of 1.9%.  Peripheral arterial disease with mildly reduced ABI on the left side with evidence of borderline significant disease for the left SFA.  Distal pulses are still palpable.  Continues to have complaints of numbness and tingling primarily in his toes.  Repeat ABIs were largely unchanged from prior study completed in 2019.  He has been continued on aspirin 81 mg daily and Repatha injections every 2 weeks.  Coronary artery disease of the native coronary artery with stable angina.  Last left heart catheterization completed in 06/2022 which revealed RCA and left circumflex stents with no significant in-stent restenosis, moderate three-vessel coronary artery disease with some progression of the mid LAD stenosis with mildly reduced LV systolic function with an EF of 40 to 25%, right heart catheterization showed normal filling pressures with no  significant pulmonary hypertension and normal cardiac output.  He currently remains chest pain free.  Continued on aspirin 81 mg daily, and Repatha 140 mg every 2 weeks, isosorbide 30 mg daily.  No further ischemic testing needed at this time.  Chronic HFimpEF/ischemic cardiomyopathy with his last LVEF of 55 to 60% on echocardiogram.  He appears to be euvolemic on exam today with NYHA class I-II symptoms.  His weight has remained stable.  He is continued on GDMT of Entresto 49/51 mg twice daily and spironolactone 25 mg daily.  Unfortunately he went been intolerant to beta-blocker therapy.  Not on SGLT2 inhibitor due to cost concerns.  Moderate aortic valve stenosis with aortic valve area valve by VTI measuring 1.22 square, aortic valve mean gradient measuring 21.8 mmHg, and aortic valve V-max measures 3.27m/s.  With continued symptoms of fatigue and lightheadedness and dizziness without syncope as his aortic stenosis narrows he becomes more symptomatic he has been scheduled for repeat echocardiogram in 6 months and being referred to structural heart for further  evaluation of aortic stenosis.  Mixed hyperlipidemia with an LDL cholesterol of 10.  Please well below goal but continued on Repatha 140 mg every 2 weeks injection.  Primary hypertension with blood pressure today 140/70.  Blood pressures remain stable.  He has been continued on Imdur 30 mg daily, Entresto 49/51 mg twice daily and spironolactone 25 mg daily.  Encouraged to continue to monitor his pressures 1 to 2 hours postmedication administration at home as well.  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  Hemoglobin A1c of 6.0 on 08/15/2023.  Recommended he follow-up with his PCP for further evaluation of neuropathy.  Ongoing management of his diabetes by PCP.       Dispo: Patient returns to clinic to see MD/APP in 6 weeks or sooner if needed for reevaluation of symptoms.  Signed, Rand Boller, NP

## 2024-03-24 ENCOUNTER — Encounter: Payer: Self-pay | Admitting: Cardiovascular Disease

## 2024-03-24 ENCOUNTER — Telehealth: Payer: Self-pay | Admitting: Cardiovascular Disease

## 2024-03-24 NOTE — Telephone Encounter (Signed)
LVM to schedule appt, please schedule 

## 2024-03-30 ENCOUNTER — Encounter: Payer: Self-pay | Admitting: Cardiovascular Disease

## 2024-04-01 ENCOUNTER — Ambulatory Visit: Admitting: Cardiovascular Disease

## 2024-04-03 ENCOUNTER — Ambulatory Visit: Admitting: Cardiovascular Disease

## 2024-04-04 ENCOUNTER — Other Ambulatory Visit: Payer: Self-pay | Admitting: Cardiovascular Disease

## 2024-04-18 ENCOUNTER — Ambulatory Visit: Attending: Cardiology

## 2024-04-18 ENCOUNTER — Other Ambulatory Visit

## 2024-04-18 DIAGNOSIS — R42 Dizziness and giddiness: Secondary | ICD-10-CM | POA: Diagnosis not present

## 2024-04-18 DIAGNOSIS — I2510 Atherosclerotic heart disease of native coronary artery without angina pectoris: Secondary | ICD-10-CM

## 2024-04-18 NOTE — Progress Notes (Signed)
 Carotid and subclavian arteries are near normal with only minimal wall thickening.  Overall a normal study.

## 2024-04-22 ENCOUNTER — Ambulatory Visit: Admitting: Cardiology

## 2024-04-27 NOTE — Progress Notes (Signed)
 [facility]   MULTIDISCIPLINARY HEART VALVE TEAM  Date:  05/06/2024   ID:  Kurt Kelley, DOB 04-07-1946, MRN [id number]  PCP:  Lyle San, MD   Chief Complaint  Patient presents with   Aortic Stenosis     HISTORY OF PRESENT ILLNESS: Kurt Kelley is a 78 y.o. male who presents for evaluation of aortic stenosis, referred by Jaylene Metz, NP.   The patient has a history of coronary artery disease and has undergone stenting of the right coronary artery and left circumflex vessels.  He has had nonischemic cardiomyopathy with improvement in LVEF, moderate aortic stenosis, hypertension, and mixed hyperlipidemia.  He is here with his wife today.  He had concerns about a recent echocardiogram that demonstrated reduction in his aortic valve area to 1.2 cm, previously measured in 2024 at 1.54 cm.  The patient reports symptoms of lightheadedness and dizziness.  He also has fatigue and exertional chest discomfort that has been managed with medical therapy.  His last heart catheterization showed nonobstructive coronary artery disease.  The patient is not limited by shortness of breath.  He has no history of rheumatic fever and no family history of bicuspid aortic valve disease. The patient has had regular dental care and reports no problems.  Past Medical History:  Diagnosis Date   Aortic stenosis    a. 04/2018 Echo: mild to mod AS w/ mild AI; b. 05/2020 Echo: Mod AS; c. 05/2021 Echo: Mod AS; d. 05/2022 Echo: EF 40-45%, Mod AS (AVA 1.67 cm^2 - VTI, mean grad ).   Arthritis    lower back, right knee   CAD S/P percutaneous coronary angioplasty 2003   a. remote PCI in 2003; b. Myoview  10/18 no ischemia, EF 39%; b. 06/2018 NSTEMI/PCI: LM nl, LAD 20p, LCX 24m (2.5x15 Resolute Onyx DES), RCA patent stent prox, 60d, EF 50-55%; c. 10/2019 MV: No isch/infarct; d. 03/2021 MV: EF 30-44%, no ischemia.   Chronic combined systolic (congestive) and diastolic (congestive) heart failure (HCC)     a. 04/2018 Echo: EF to 50-55%, Gr1DD; b. 05/2020 Echo: EF 60-65%, Gr1 DD; c. 05/2021 Echo: EF 50-55%, GrI DD; d. 05/2022 Echo: EF 40-45%, inf/septal HK, GrI DD.   Diabetes mellitus type II, controlled (HCC)    HLD (hyperlipidemia)    a. intolerant to statins   Hypertension    Ischemic cardiomyopathy    a. 04/2018 Echo: EF to 50-55%, no RWMA, Gr1DD, mild to moderate aortic stenosis with mild aortic insufficiency; b. 05/2020 Echo: EF 60-65%, no rwma, Gr1 DD, nl RV size/fxn, mod dil LA. Mod AS; c. 05/2021 Echo: EF 50-55%, GrI DD; d. 05/2022 Echo: EF 40-45%, inf/septal HK, GrI DD, RVSP 37.55mmHg, mod dil LA, mild MR, mild AI, mod AS.   Myocardial infarction (HCC) 06/2018   PAD (peripheral artery disease) (HCC)    Sleep apnea    a. intolerant of CPAP    Current Outpatient Medications  Medication Sig Dispense Refill   acetaminophen  (TYLENOL ) 500 MG tablet Take 1,000 mg by mouth every 8 (eight) hours as needed for moderate pain.     albuterol  (PROVENTIL  HFA;VENTOLIN  HFA) 108 (90 Base) MCG/ACT inhaler Inhale 2 puffs into the lungs daily.     aspirin  81 MG tablet Take 81 mg by mouth at bedtime.      colchicine 0.6 MG tablet Take 0.6 mg by mouth as needed.     Evolocumab  (REPATHA  SURECLICK) 140 MG/ML SOAJ Inject 140 mg into the skin every 14 (fourteen) days. 2  mL 11   fluticasone (FLONASE) 50 MCG/ACT nasal spray Place 1 spray into both nostrils at bedtime.     MYRBETRIQ  50 MG TB24 tablet TAKE 1 TABLET (50 MG TOTAL) BY MOUTH DAILY AS NEEDED (OVERACTIVE BLADDER SYMPTOMS/LEAKAGE). 30 tablet 11   Naphazoline HCl (CLEAR EYES OP) Place 1 drop into both eyes daily as needed (redness).     nitroGLYCERIN  (NITROSTAT ) 0.4 MG SL tablet Place 1 tablet (0.4 mg total) under the tongue every 5 (five) minutes as needed for chest pain. 25 tablet 0   omeprazole (PRILOSEC) 20 MG capsule Take 1 tablet by mouth daily as needed.     sacubitril -valsartan  (ENTRESTO ) 97-103 MG TAKE 1 TABLET BY MOUTH TWICE A DAY (Patient taking  differently: Take 0.5 tablets by mouth 2 (two) times daily.) 60 tablet 0   spironolactone  (ALDACTONE ) 25 MG tablet TAKE 1 TABLET (25 MG TOTAL) BY MOUTH DAILY. 90 tablet 3   TRULICITY 0.75 MG/0.5ML SOPN Inject 0.75 mg into the skin every Monday.     No current facility-administered medications for this visit.    ALLERGIES:   Capsicum, Ticagrelor , and Ampicillin   SOCIAL HISTORY:  The patient  reports that he quit smoking about 37 years ago. His smoking use included cigarettes. He has been exposed to tobacco smoke. He has never used smokeless tobacco. He reports current alcohol use of about 11.0 standard drinks of alcohol per week. He reports that he does not currently use drugs.   FAMILY HISTORY:  The patient's family history includes Alzheimer's disease in his mother; CAD in his father; Diabetes in his mother; Kidney cancer in his father; Lung cancer in his father.   REVIEW OF SYSTEMS:    All other systems are reviewed and negative.   PHYSICAL EXAM: VS:  BP 130/78   Pulse 66   Ht 6\' 3"  (1.905 m)   Wt 258 lb (117 kg)   SpO2 94%   BMI 32.25 kg/m  , BMI Body mass index is 32.25 kg/m. GEN: Well nourished, well developed, in no acute distress HEENT: normal Neck: No JVD. carotids 2+ with bilateral bruits Cardiac: The heart is RRR with 2/6 crescendo decrescendo murmur at the right upper sternal border.  No edema. Respiratory:  clear to auscultation bilaterally GI: soft, nontender, nondistended, + BS MS: no deformity or atrophy Skin: warm and dry, no rash Neuro:  Strength and sensation are intact Psych: euthymic mood, full affect   RECENT LABS: No results found for requested labs within last 365 days.  No results found for requested labs within last 365 days.   CrCl cannot be calculated (Patient's most recent lab result is older than the maximum 21 days allowed.).   Wt Readings from Last 3 Encounters:  05/05/24 257 lb 9.6 oz (116.8 kg)  04/28/24 258 lb (117 kg)  03/18/24 260 lb  (117.9 kg)     Echo: 1. Left ventricular ejection fraction, by estimation, is 55 to 60%. The  left ventricle has normal function. The left ventricle has no regional  wall motion abnormalities. There is mild left ventricular hypertrophy.  Left ventricular diastolic parameters  are consistent with Grade I diastolic dysfunction (impaired relaxation).   2. Right ventricular systolic function is normal. The right ventricular  size is normal.   3. The mitral valve is normal in structure. No evidence of mitral valve  regurgitation.   4. The aortic valve is calcified. Aortic valve regurgitation is mild.  Moderate aortic valve stenosis. Aortic valve area, by VTI measures  1.22  cm. Aortic valve mean gradient measures 21.8 mmHg. Aortic valve Vmax  measures 3.12 m/s.   5. The inferior vena cava is normal in size with greater than 50%  respiratory variability, suggesting right atrial pressure of 3 mmHg.   Comparison(s): Previous AV meas: max PG, mean.   Cardiac Cath 07/03/2022: 1.  Patent RCA and left circumflex stents with no significant restenosis.  Moderate three-vessel coronary artery disease with some progression of mid LAD stenosis but still does not seem to be obstructive. 2.  Mildly to moderately reduced LV systolic function with an EF of 40 to 45%. 3.  Right heart catheterization showed high normal filling pressures, no significant pulmonary hypertension and normal cardiac output. 4.  Mild aortic stenosis with a mean gradient of 11 mmHg.   Recommendations: Continue aggressive medical therapy for coronary artery disease and chronic systolic heart failure.  The patient's volume status appears to be optimal.  ASSESSMENT AND PLAN: 1.  Nonrheumatic aortic stenosis, moderate: the patient's echocardiogram is reviewed and shows normal LVEF of 55-60%, mild LVH, grade 1 diastolic dysfunction, normal RV function, normal mitral valve function, calcification of the aortic valve with mild  AI and moderate AS with a mean gradient of 22 mmHg, Vmax of 3.1 m/s, SVI 32, and LVot/AV ratio of 0.32.  Comparison is made to prior echo studies with echo in 08/2022 showing LVEF of 50-55% with mean transaortic gradient of 23 mmHg and 2022 when the mean gradient was 27 mmHg. Cardiac cath in 2023 showed moderate 3 vessel CAD with mean transaortic gradient by catheter pullback of 11 mmHg consistent with mild AS.  I reviewed his cardiac catheterization films today and he does appear to have moderate stenoses in the mid LAD and mid circumflex from his catheterization in 2023.  There were no high-grade stenoses noted.  I have reviewed the natural history of aortic stenosis with the patient and their family members who are present today. We have discussed the limitations of medical therapy and the poor prognosis associated with symptomatic aortic stenosis. We have reviewed potential treatment options, including palliative medical therapy, conventional surgical aortic valve replacement, and transcatheter aortic valve replacement. We discussed treatment options in the context of the patient's specific comorbid medical conditions.  When I compare his echo studies over time, there is really no progressive worsening of aortic valve gradients over the last 3 years.  His gradient measurements and aortic valve dimensionless index remain in the moderate range.  His aortic stenosis by exam and echo assessment is in the moderate range.  I do not think there is a current indication to move forward with aortic valve intervention.  Continued surveillance is indicated and I have recommended that he follow-up with an annual echocardiogram.  He is followed regularly by Ronald Cockayne and Dr. Alvenia Aus.  I would be happy to see him back on an as-needed basis and the patient understands that his aortic stenosis will likely progress to the severe range over the next 2 to 3 years.  He is counseled about progressive symptoms of aortic stenosis  which would include worsening dyspnea, chest discomfort, or progressive fatigue.  In reviewing his medications, especially with his primary complaint being dizziness, I recommended holiday from isosorbide  to see if this improves his symptoms.  He will continue to follow closely in the [place] office and I would be happy to see him back in the future.  I think he will likely be a good candidate for TAVR as  long as his anatomy is favorable.    Michell Ahumada 05/06/2024 6:23 AM     [facility] [address] [place] [address]  [phone] (office) [phone] (fax)

## 2024-04-28 ENCOUNTER — Encounter: Payer: Self-pay | Admitting: Cardiovascular Disease

## 2024-04-28 ENCOUNTER — Ambulatory Visit: Attending: Cardiovascular Disease | Admitting: Cardiovascular Disease

## 2024-04-28 VITALS — BP 130/78 | HR 66 | Ht 75.0 in | Wt 258.0 lb

## 2024-04-28 DIAGNOSIS — I350 Nonrheumatic aortic (valve) stenosis: Secondary | ICD-10-CM

## 2024-04-28 NOTE — Patient Instructions (Signed)
 Follow-Up: At [facility], you and your health needs are our priority.  As part of our continuing mission to provide you with exceptional heart care, our providers are all part of one team.  This team includes your primary Cardiologist (physician) and Advanced Practice Providers or APPs (Physician Assistants and Nurse Practitioners) who all work together to provide you with the care you need, when you need it.  Your next appointment:   As Needed  Provider:   Arnoldo Lapping, MD

## 2024-05-01 ENCOUNTER — Ambulatory Visit: Admitting: Cardiology

## 2024-05-05 ENCOUNTER — Encounter: Payer: Self-pay | Admitting: Cardiology

## 2024-05-05 ENCOUNTER — Ambulatory Visit: Attending: Cardiology | Admitting: Cardiology

## 2024-05-05 VITALS — BP 137/78 | HR 69 | Ht 75.0 in | Wt 257.6 lb

## 2024-05-05 DIAGNOSIS — I255 Ischemic cardiomyopathy: Secondary | ICD-10-CM | POA: Diagnosis not present

## 2024-05-05 DIAGNOSIS — I10 Essential (primary) hypertension: Secondary | ICD-10-CM | POA: Diagnosis not present

## 2024-05-05 DIAGNOSIS — Z794 Long term (current) use of insulin: Secondary | ICD-10-CM | POA: Diagnosis not present

## 2024-05-05 DIAGNOSIS — R072 Precordial pain: Secondary | ICD-10-CM | POA: Diagnosis not present

## 2024-05-05 DIAGNOSIS — I2510 Atherosclerotic heart disease of native coronary artery without angina pectoris: Secondary | ICD-10-CM | POA: Diagnosis not present

## 2024-05-05 DIAGNOSIS — I350 Nonrheumatic aortic (valve) stenosis: Secondary | ICD-10-CM

## 2024-05-05 DIAGNOSIS — I5032 Chronic diastolic (congestive) heart failure: Secondary | ICD-10-CM

## 2024-05-05 DIAGNOSIS — E118 Type 2 diabetes mellitus with unspecified complications: Secondary | ICD-10-CM | POA: Diagnosis not present

## 2024-05-05 DIAGNOSIS — R42 Dizziness and giddiness: Secondary | ICD-10-CM | POA: Diagnosis not present

## 2024-05-05 DIAGNOSIS — E782 Mixed hyperlipidemia: Secondary | ICD-10-CM

## 2024-05-05 NOTE — Progress Notes (Signed)
 Cardiology Office Note:  .   Date:  05/05/2024  ID:  Joanna Muck, DOB 1946-06-28, MRN [id number] PCP: Lyle San, MD  [facility] Providers Cardiologist:  Antionette Kirks, MD    History of Present Illness: .   Kurt Kelley is a 78 y.o. male with a past medical history of coronary artery disease status post prior RCA and circumflex stenting, nonischemic cardiomyopathy, moderate aortic stenosis, HFpEF, hypertension, hyperlipidemia (statin intolerant), obesity, chronic low back pain, peripheral arterial disease/claudication, sleep apnea with CPAP intolerance, who is here today to follow-up on his coronary artery disease.   Previous vascular evaluation in May 2019 showed ABI's of 1.05 on the right and 0.9 cm on the left.  Duplex showed mild atherosclerosis of the right lower extremity.  On the left there was moderate activities affecting the distal SFA.  He previously undergone LHC with PCI/DES to the left circumflex.  Repeat stress testing in 2020 was nonischemic.  Echocardiogram revealed LVEF of 60 to 65%, mildly increased LV wall thickness, G1 DD, moderate calcification of the aortic valve, aortic valve regurgitation is mild, moderate stenosis of aortic valve with a mean gradient of 20 mmHg, peak gradient of 38 mmHg, AVA 1.28 cm.  Repeat echocardiogram done in 2021 revealed LVEF 60 to 65%, no RWMA, G1 DD, moderate aortic valve stenosis, aortic valve area by VTI measures 1.47 cm, mean gradient measured 26 mmHg, aortic valve V-max measured 3.66m/s.  Repeat Myoview  completed 03/2021 revealed no evidence of ischemia.  Echocardiogram completed 05/2021 revealed LVEF 50-55%, G1 DD, no RWMA, mild mitral valve regurgitation, mean aortic valve gradient of 27 mmHg, peak gradient 45 mmHg, V-max 3.73m/s, AVA 1.5 cm, moderate aortic valve stenosis.  Echocardiogram 05/2022 revealed a drop in LVEF of 40-45%, with wall motion abnormalities, G1 DD, moderate aortic valve stenosis by VTI measuring 1.67 cm.   Right and left heart catheterization completed 07/03/2022 revealed patent RCA and left circumflex stent with no significant restenosis.  Moderate three-vessel coronary artery disease with some progression of mid LAD stenosis but still does not seem to be obstructive.  Mild aortic stenosis with a mean gradient of 11 mmHg.  Mild to moderately reduced LV systolic function with an EF of 40-45%.  Limited echocardiogram completed 09/15/2022 revealed LVEF 50-55%, no RWMA, mild LVH, mild MR, moderate aortic valve stenosis, aortic valve mean gradient measured 23 mmHg, aortic valve V-max measured 3.55m/s.  Echocardiogram completed 09/11/2023 revealed an LVEF of 55 to 60%, no RWMA, G1 DD, moderate calcification of the aortic valve, mild AR, moderate aortic valve stenosis, aortic valve area VTI measures 1.54 cm, mean gradient measured 24.2 mmHg, aortic valve V-max measures 3.40m/s.   Evaluated by Dr. Alvenia Aus in February 2025.  At that time he reported he not been doing well for the last 2 months unless concerns.  Had increased palpitations, associated dizziness but no syncope.  Was placed on ZIO monitor for 2 weeks and advised to follow-up with neurology.  He was seen in clinic 03/18/2024 with continued symptoms.  He had noted an increased amount of fatigue but denied syncope or near syncope.  ZIO monitoring was reviewed.  As well as AP somewhat largely unchanged.  He had several questions related to his aortic valve stenosis.  He was scheduled for repeat echocardiogram in 6 months and was referred to structural heart for further evaluation of aortic stenosis and to get an idea of possible treatment plans.   He was evaluated by Dr. Arlester Ladd on 04/28/2024 for his moderate  aortic stenosis.  They discussed the natural history of aortic stenosis.  Discussed the limitations of medical therapy and the poor prognosis associated with symptomatic aortic stenosis.  They reviewed potential treatment options, including palliative medical  therapy, conventional surgical aortic valve replacement, transcatheter aortic valve replacement.  They discussed treatment options in the context of the patient's specific medical conditions.  Echo 0 cardiograms over the past 3 years were compared and there was really no progressive worsening of aortic valve gradients.  Gradient managements of the aortic valve dimensions remain within the moderate range.  He returns to clinic today accompanied by his wife.  He states that he continues to have some lightheadedness and dizziness, fatigue, and is noted some chest discomfort midsternal that happens with exertion and with rest.  He recently had a carotid duplex that was reassuring.  He has stopped taking his Imdur  but continues to have some lightheadedness and dizziness but believes that some of it is improved since being off of his Imdur .  He did follow-up with structural heart and was advised he has approximately 3 years before needing TAVR procedure.  If he continues with some varying symptoms that are concerning for potential TIA possible seizures with staring off with blank face without responding.  His wife is also concerned and is considering taking him to visit neurology.  He continues to complain of neuropathy to his bilateral feet.  States that he has been compliant with his current medications.  Denies any hospitalizations or visits to the emergency department.  ROS: 10 point review of system has been reviewed and considered negative with exception was been listed in the HPI  Studies Reviewed: Aaron Aas   EKG Interpretation Date/Time:  Monday May 05 2024 14:14:21 EDT Ventricular Rate:  69 PR Interval:  164 QRS Duration:  110 QT Interval:  390 QTC Calculation: 417 R Axis:   -1  Text Interpretation: Normal sinus rhythm When compared with ECG of 28-Apr-2024 11:15, Premature ventricular complexes are no longer Present No significant change since last tracing Confirmed by Ronald Cockayne ([id number]) on 05/05/2024  4:44:45 PM    Carotid Duplex 04/18/2024 Summary:  Right Carotid: The extracranial vessels were near-normal with only minimal  wall thickening or plaque.   Left Carotid: The extracranial vessels were near-normal with only minimal  Wall thickening or plaque.   Vertebrals:  Bilateral vertebral arteries demonstrate antegrade flow.  Subclavians: Normal flow hemodynamics were seen in bilateral subclavian arteries.   2D echo 03/14/2024 1. Left ventricular ejection fraction, by estimation, is 55 to 60%. The  left ventricle has normal function. The left ventricle has no regional  wall motion abnormalities. There is mild left ventricular hypertrophy.  Left ventricular diastolic parameters  are consistent with Grade I diastolic dysfunction (impaired relaxation).   2. Right ventricular systolic function is normal. The right ventricular  size is normal.   3. The mitral valve is normal in structure. No evidence of mitral valve  regurgitation.   4. The aortic valve is calcified. Aortic valve regurgitation is mild.  Moderate aortic valve stenosis. Aortic valve area, by VTI measures 1.22  cm. Aortic valve mean gradient measures 21.8 mmHg. Aortic valve Vmax  measures 3.12 m/s.   5. The inferior vena cava is normal in size with greater than 50%  respiratory variability, suggesting right atrial pressure of 3 mmHg.   Comparison(s): Previous AV meas: max PG, mean.    Event Monitor (Zio) 02/14/24 Patient had a min HR of 45 bpm, max HR  of 169 bpm, and avg HR of 72 bpm. Predominant underlying rhythm was Sinus Rhythm. 2 Supraventricular Tachycardia runs occurred, the run with the fastest interval lasting 4 beats with a max rate of 169 bpm, the  longest lasting 4 beats with an avg rate of 105 bpm.  Occasional PVCs with a burden of 1.9%.   TTE 09/11/23 1. Left ventricular ejection fraction, by estimation, is 55 to 60%. The  left ventricle has normal function. The left ventricle has no regional   wall motion abnormalities. The left ventricular internal cavity size was  mildly dilated. Left ventricular  diastolic parameters are consistent with Grade I diastolic dysfunction  (impaired relaxation).   2. Right ventricular systolic function is normal. The right ventricular  size is normal.   3. Left atrial size was mildly dilated.   4. The mitral valve is normal in structure. No evidence of mitral valve  regurgitation. No evidence of mitral stenosis.   5. The aortic valve is calcified. There is moderate calcification of the  aortic valve. Aortic valve regurgitation is mild. Moderate aortic valve  stenosis. Aortic valve area, by VTI measures 1.54 cm. Aortic valve mean  gradient measures 24.2 mmHg. Aortic   valve Vmax measures 3.33 m/s.   6. The inferior vena cava is normal in size with greater than 50%  respiratory variability, suggesting right atrial pressure of 3 mmHg.    Limited TTE 09/15/22 1. Left ventricular ejection fraction, by estimation, is 50 to 55%. The  left ventricle has low normal function. The left ventricle has no regional  wall motion abnormalities. The left ventricular internal cavity size was  mildly dilated. There is mild  left ventricular hypertrophy. Left ventricular diastolic parameters are  consistent with Grade I diastolic dysfunction (impaired relaxation). The  average left ventricular global longitudinal strain is -12.8 %.   2. Right ventricular systolic function is normal. The right ventricular  size is normal. Tricuspid regurgitation signal is inadequate for assessing  PA pressure.   3. The mitral valve is normal in structure. Mild mitral valve  regurgitation. No evidence of mitral stenosis.   4. The aortic valve is normal in structure. Aortic valve regurgitation is  not visualized. Moderate aortic valve stenosis. Aortic valve mean gradient  measures 23.0 mmHg. Aortic valve Vmax measures 3.09 m/s.   5. The inferior vena cava is normal in size with  greater than 50%  respiratory variability, suggesting right atrial pressure of 3 mmHg.    [facility] 07/03/22   Dist Cx lesion is 40% stenosed.   Dist RCA lesion is 50% stenosed.   Prox LAD to Mid LAD lesion is 50% stenosed.   Non-stenotic Mid Cx lesion was previously treated.   Non-stenotic Prox RCA lesion was previously treated.   There is mild to moderate left ventricular systolic dysfunction.   LV end diastolic pressure is mildly elevated.   1.  Patent RCA and left circumflex stents with no significant restenosis.  Moderate three-vessel coronary artery disease with some progression of mid LAD stenosis but still does not seem to be obstructive. 2.  Mildly to moderately reduced LV systolic function with an EF of 40 to 45%. 3.  Right heart catheterization showed high normal filling pressures, no significant pulmonary hypertension and normal cardiac output. 4.  Mild aortic stenosis with a mean gradient of 11 mmHg.   Recommendations: Continue aggressive medical therapy for coronary artery disease and HF systolic heart failure.  The patient's volume status appears to be optimal. Risk Assessment/Calculations:  Physical Exam:   VS:  BP 137/78 (BP Location: Left Arm, Patient Position: Sitting, Cuff Size: Normal)   Pulse 69   Ht 6\' 3"  (1.905 m)   Wt 257 lb 9.6 oz (116.8 kg)   SpO2 97%   BMI 32.20 kg/m    Wt Readings from Last 3 Encounters:  05/05/24 257 lb 9.6 oz (116.8 kg)  04/28/24 258 lb (117 kg)  03/18/24 260 lb (117.9 kg)    GEN: Well nourished, well developed in no acute distress NECK: No JVD; heart murmur radiates to the right and left carotid CARDIAC: RRR, III/VI systolic murmur R/LSB without rubs or gallops RESPIRATORY:  Clear to auscultation without rales, wheezing or rhonchi  ABDOMEN: Soft, non-tender, non-distended EXTREMITIES: Trace pretibial edema; No deformity   ASSESSMENT AND PLAN: .   Coronary artery disease native coronary artery with stable angina.   Patient states that he has not taken his Imdur  as he was concerned that was causing some of his dizziness and lightheadedness.  He states that he has been suffering from chest discomfort and uneasiness that is substernal and happens with rest and exertion is relieved by continued rest.  He noted last episode when he was taking the trash out when he was exerting himself walking down the driveway and it took about 10 minutes to recover.  His last heart catheterization was completed 06/2022 which revealed RCA and left circumflex stents with no significant in-stent restenosis, moderate three-vessel coronary disease with some progression of mid LAD stenosis with mildly reduced LV systolic function with an EF of 40-45%, right heart catheterization showed normal filling pressures with no significant pulmonary hypertension and normal cardiac output.  With continued chest discomfort.  He has been scheduled for Lexiscan  Myoview .  EKG today reveals sinus rhythm with rate of 69 with no significant change from prior studies.  Chronic HFimpEF/ischemic cardiomyopathy with last LVEF of 55 to 60% echocardiogram.  He continues to remain euvolemic on exam today with NYHA class I-II symptoms.  His weight has remained stable.  He is continued on GDMT of Entresto  49/51 mg twice daily and spironolactone  25 mg daily.  Unfortunately he is intolerant of beta-blocker therapy and is not on any current beta-blockers.  He also does not require diuretics due to being euvolemic on exam.  Discussed SGLT2 inhibitors but he is on current therapy due to cost concerns.  Moderate aortic valve stenosis with last echocardiogram completed in 02/2024 with an LVEF 55 to 60% and an aortic valve area measured by VTI at 1.22 cm and a mean gradient of 21.8 mmHg.  He recently followed up with structural and was advised last he is not currently in need of a TAVR but with his concerns and symptoms he is scheduled for annual echocardiogram for continued  evaluation of his aortic stenosis.  He is also continued on Repatha  140 mg twice daily.  Primary hypertension with a blood pressure today 137/78.  He has been continued on Entresto  49/51 mg twice daily and spironolactone  25 mg daily.  He has been encouraged to continue to monitor his blood pressures 1 to 2 hours postmedication administration at home as well.  Mixed hyperlipidemia previous continue to remain at goal.  Repatha  140 mg injection twice weekly has been continued.  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  Hemoglobin A1c 6.0 on 08/15/2023.  Previous ABI's were completed and were unchanged.  He was recommended to continue to follow-up with his PCP  for further evaluation of neuropathy as well as ongoing management of his diabetes.  Lightheadedness and dizziness without syncope slightly improved since stopping Imdur .  Imdur  has been discontinued at this time.  He has been encouraged to follow-up with neurology and if need be ENT for workup of inner ear issues as previous carotid duplex revealed no stenosis.       Dispo: Patient return to clinic to see MD/APP in 3 months or sooner if needed  Signed, Buddie Marston, NP

## 2024-05-05 NOTE — Patient Instructions (Addendum)
 Medication Instructions:  Your physician recommends the following medication changes.  STOP TAKING: Imdur   *If you need a refill on your cardiac medications before your next appointment, please call your pharmacy*  Lab Work: No labs ordered today  If you have labs (blood work) drawn today and your tests are completely normal, you will receive your results only by: MyChart Message (if you have MyChart) OR A paper copy in the mail If you have any lab test that is abnormal or we need to change your treatment, we will call you to review the results.  Testing/Procedures: Lexiscan  and ECHO  Your physician has requested that you have an echocardiogram. Echocardiography is a painless test that uses sound waves to create images of your heart. It provides your doctor with information about the size and shape of your heart and how well your heart's chambers and valves are working.   You may receive an ultrasound enhancing agent through an IV if needed to better visualize your heart during the echo. This procedure takes approximately one hour.  There are no restrictions for this procedure.  This will take place at [address] [facility] [address], [place] [address]  Please note: We ask at that you not bring children with you during ultrasound (echo/ vascular) testing. Due to room size and safety concerns, children are not allowed in the ultrasound rooms during exams. Our front office staff cannot provide observation of children in our lobby area while testing is being conducted. An adult accompanying a patient to their appointment will only be allowed in the ultrasound room at the discretion of the ultrasound technician under special circumstances. We apologize for any inconvenience.    Your provider has ordered a Lexiscan / Exercise Myoview  Stress test. This will take place at [facility]. Please report to the [facility] medical mall entrance. The volunteers at the first desk will direct you where  to go.  [facility] MYOVIEW   Your provider has ordered a Stress Test with nuclear imaging. The purpose of this test is to evaluate the blood supply to your heart muscle. This procedure is referred to as a "Non-Invasive Stress Test." This is because other than having an IV started in your vein, nothing is inserted or "invades" your body. Cardiac stress tests are done to find areas of poor blood flow to the heart by determining the extent of coronary artery disease (CAD). Some patients exercise on a treadmill, which naturally increases the blood flow to your heart, while others who are unable to walk on a treadmill due to physical limitations will have a pharmacologic/chemical stress agent called Lexiscan  . This medicine will mimic walking on a treadmill by temporarily increasing your coronary blood flow.   Please note: these test may take anywhere between 2-4 hours to complete  How to prepare for your Myoview  test:  Nothing to eat for 6 hours prior to the test No caffeine for 24 hours prior to test No smoking 24 hours prior to test. Your medication may be taken with water.  If your doctor stopped a medication because of this test, do not take that medication. Ladies, please do not wear dresses.  Skirts or pants are appropriate. Please wear a short sleeve shirt. No perfume, cologne or lotion. Wear comfortable walking shoes. No heels!   PLEASE NOTIFY THE OFFICE AT LEAST 24 HOURS IN ADVANCE IF YOU ARE UNABLE TO KEEP YOUR APPOINTMENT.  [phone] AND  PLEASE NOTIFY NUCLEAR MEDICINE AT [facility] AT LEAST 24 HOURS IN ADVANCE IF  YOU ARE UNABLE TO KEEP YOUR APPOINTMENT. [phone]   Follow-Up: At [facility], you and your health needs are our priority.  As part of our continuing mission to provide you with exceptional heart care, our providers are all part of one team.  This team includes your primary Cardiologist (physician) and Advanced Practice Providers or APPs (Physician Assistants and Nurse  Practitioners) who all work together to provide you with the care you need, when you need it.  Your next appointment:   3 month(s)  Provider:   Antionette Kirks, MD

## 2024-05-22 ENCOUNTER — Ambulatory Visit: Payer: PPO | Admitting: Urology

## 2024-05-29 ENCOUNTER — Ambulatory Visit

## 2024-06-04 ENCOUNTER — Telehealth: Payer: Self-pay

## 2024-06-04 ENCOUNTER — Encounter: Payer: Self-pay | Admitting: Cardiovascular Disease

## 2024-06-04 ENCOUNTER — Other Ambulatory Visit (HOSPITAL_COMMUNITY): Payer: Self-pay

## 2024-06-04 NOTE — Telephone Encounter (Signed)
 Patient Advocate Encounter   The patient was approved for a [employer] grant that will help cover the cost of Repatha  Total amount awarded, $2,500.  Effective: 09/28/23 - 09/26/24  REMAINING FUND OF $0454.09 AVAILABLE EXPIRING OCT.    [id number]   Patient informed via Tilman Fonder, CPhT  Pharmacy Patient Advocate Specialist  Direct Number: [phone] Fax: [phone]

## 2024-06-04 NOTE — Telephone Encounter (Signed)
 Patient still has an active grant on file. See separate encounter for details. Mychart sent to patient.

## 2024-06-05 ENCOUNTER — Ambulatory Visit
Admission: RE | Admit: 2024-06-05 | Discharge: 2024-06-05 | Disposition: A | Discharge status: Home / Self Care | Source: Ambulatory Visit | Attending: Cardiology | Admitting: Cardiology

## 2024-06-05 ENCOUNTER — Ambulatory Visit: Payer: Self-pay | Admitting: Cardiology

## 2024-06-05 DIAGNOSIS — R072 Precordial pain: Secondary | ICD-10-CM | POA: Diagnosis not present

## 2024-06-05 LAB — NM MYOCAR MULTI W/SPECT W/WALL MOTION / EF
Estimated workload: 1
Exercise duration (min): 0 min
Exercise duration (sec): 0 s
LV dias vol: 187 mL (ref 62–150)
LV sys vol: 88 mL (ref 4.2–5.8)
MPHR: 144 {beats}/min
Nuc Stress EF: 53 %
Peak HR: 93 {beats}/min
Percent HR: 68 %
Rest HR: 68 {beats}/min
Rest Nuclear Isotope Dose: 10.4 mCi
SDS: 1
SRS: 9
SSS: 6
ST Depression (mm): 0 mm
Stress Nuclear Isotope Dose: 31.5 mCi
TID: 0.92

## 2024-06-05 MED ORDER — TECHNETIUM TC 99M TETROFOSMIN IV KIT
31.4500 | PACK | Freq: Once | INTRAVENOUS | Status: AC | PRN
  Administered 2024-06-05: 31.45 via INTRAVENOUS

## 2024-06-05 MED ORDER — REGADENOSON 0.4 MG/5ML IV SOLN
0.4000 mg | Freq: Once | INTRAVENOUS | Status: AC
  Administered 2024-06-05: 0.4 mg via INTRAVENOUS

## 2024-06-05 MED ORDER — TECHNETIUM TC 99M TETROFOSMIN IV KIT
10.4300 | PACK | Freq: Once | INTRAVENOUS | Status: AC | PRN
  Administered 2024-06-05: 10.43 via INTRAVENOUS

## 2024-06-05 NOTE — Progress Notes (Signed)
 EKG negative for ischemia.  There was no evidence of ischemia.  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  Study is considered low risk.

## 2024-06-10 NOTE — Telephone Encounter (Signed)
 Just clarify that he and his wife understand that infarct is old finding from prior damage not a new finding. And without any new symptoms he can keep his follow up in August with Dr Alvenia Aus. Thanks, [employer]

## 2024-06-17 ENCOUNTER — Other Ambulatory Visit

## 2024-07-28 DIAGNOSIS — E119 Type 2 diabetes mellitus without complications: Secondary | ICD-10-CM | POA: Diagnosis not present

## 2024-07-28 DIAGNOSIS — E559 Vitamin D deficiency, unspecified: Secondary | ICD-10-CM | POA: Diagnosis not present

## 2024-07-28 DIAGNOSIS — Z125 Encounter for screening for malignant neoplasm of prostate: Secondary | ICD-10-CM | POA: Diagnosis not present

## 2024-07-28 DIAGNOSIS — I10 Essential (primary) hypertension: Secondary | ICD-10-CM | POA: Diagnosis not present

## 2024-07-28 DIAGNOSIS — E782 Mixed hyperlipidemia: Secondary | ICD-10-CM | POA: Diagnosis not present

## 2024-08-05 ENCOUNTER — Ambulatory Visit: Attending: Cardiovascular Disease | Admitting: Cardiovascular Disease

## 2024-08-05 ENCOUNTER — Encounter: Payer: Self-pay | Admitting: Cardiovascular Disease

## 2024-08-05 VITALS — BP 142/80 | HR 74 | Ht 74.0 in | Wt 261.6 lb

## 2024-08-05 DIAGNOSIS — I10 Essential (primary) hypertension: Secondary | ICD-10-CM

## 2024-08-05 DIAGNOSIS — I25118 Atherosclerotic heart disease of native coronary artery with other forms of angina pectoris: Secondary | ICD-10-CM | POA: Diagnosis not present

## 2024-08-05 DIAGNOSIS — E785 Hyperlipidemia, unspecified: Secondary | ICD-10-CM

## 2024-08-05 DIAGNOSIS — I5022 Chronic systolic (congestive) heart failure: Secondary | ICD-10-CM

## 2024-08-05 DIAGNOSIS — I739 Peripheral vascular disease, unspecified: Secondary | ICD-10-CM | POA: Diagnosis not present

## 2024-08-05 DIAGNOSIS — I359 Nonrheumatic aortic valve disorder, unspecified: Secondary | ICD-10-CM | POA: Diagnosis not present

## 2024-08-05 NOTE — Patient Instructions (Signed)

## 2024-08-05 NOTE — Progress Notes (Signed)
 Cardiology Office Note   Date:  08/05/2024   ID:  Kurt Kelley, DOB Aug 30, 1946, MRN [id number]  PCP:  Valora Agent, MD  Cardiologist:  Deatrice Cage, MD   Chief Complaint  Patient presents with   Follow-up    Coronary artery disease, and chronic systolic heart failure, history of being off balance,dizziness and shortness of breath.      History of Present Illness: Kurt Kelley is a 78 y.o. male who is here today for a follow-up visit regarding coronary artery disease, chronic systolic heart failure and aortic stenosis. The patient has known history of coronary artery disease with previous stent placement in 2003, chronic systolic heart failure due to mild ischemic cardiomyopathy, aortic stenosis, essential hypertension, hyperlipidemia, sleep apnea and obesity.  He has known history of intolerance to statins due to severe myalgia.  He also reports intolerance to CPAP due to claustrophobia.    The patient was hospitalized in July 2019 with non-ST elevation myocardial infarction.  Cardiac catheterization showed mild LAD disease, 95% stenosis in the mid left circumflex, patent RCA stent and 60% distal RCA stenosis.  I performed successful angioplasty and drug-eluting stent placement to the left circumflex.  There was evidence of moderate aortic stenosis with a peak gradient of 15 to 20 mmHg.  EF was 50 to 55%.  The patient did not tolerate beta-blockers.   He had hematuria on dual antiplatelet therapy that improved after discontinuing Brilinta . He did undergo cystoscopy which showed marked BPH felt to be the source of hematuria.    He had back surgery done in 2022 at [facility] with improvement in back pain. Hydrochlorothiazide was discontinued due to gout.  He was seen in March and reported continued episodes of shortness of breath and intermittent chest pain.  He had an echocardiogram done in June which showed an EF of 40 to 45% with mild pulmonary hypertension and mild mitral  regurgitation.  Aortic stenosis was moderate.  Most recent cardiac catheterization in July 2023 showed patent RCA and left circumflex stents with no significant restenosis.  There was moderate three-vessel coronary artery disease with some progression of mid LAD stenosis but did not appear obstructive.  EF was 40 to 45%.  Right heart catheterization showed high normal filling pressures, no significant pulmonary hypertension and normal cardiac output.  Aortic stenosis was mild with a mean gradient of 11 mmHg.  Most recent echocardiogram in March of this year showed normal LV systolic function with moderate aortic stenosis with mean gradient of 22 mmHg.  He had a Lexiscan  Myoview  in June that showed evidence of prior inferior/inferolateral infarct without ischemia.  He has been doing reasonably well with no chest pain or shortness of breath.  No palpitations.  Past Medical History:  Diagnosis Date   Aortic stenosis    a. 04/2018 Echo: mild to mod AS w/ mild AI; b. 05/2020 Echo: Mod AS; c. 05/2021 Echo: Mod AS; d. 05/2022 Echo: EF 40-45%, Mod AS (AVA 1.67 cm^2 - VTI, mean grad ).   Arthritis    lower back, right knee   CAD S/P percutaneous coronary angioplasty 2003   a. remote PCI in 2003; b. Myoview  10/18 no ischemia, EF 39%; b. 06/2018 NSTEMI/PCI: LM nl, LAD 20p, LCX 38m (2.5x15 Resolute Onyx DES), RCA patent stent prox, 60d, EF 50-55%; c. 10/2019 MV: No isch/infarct; d. 03/2021 MV: EF 30-44%, no ischemia.   Chronic combined systolic (congestive) and diastolic (congestive) heart failure (HCC)    a. 04/2018 Echo:  EF to 50-55%, Gr1DD; b. 05/2020 Echo: EF 60-65%, Gr1 DD; c. 05/2021 Echo: EF 50-55%, GrI DD; d. 05/2022 Echo: EF 40-45%, inf/septal HK, GrI DD.   Diabetes mellitus type II, controlled (HCC)    HLD (hyperlipidemia)    a. intolerant to statins   Hypertension    Ischemic cardiomyopathy    a. 04/2018 Echo: EF to 50-55%, no RWMA, Gr1DD, mild to moderate aortic stenosis with mild aortic  insufficiency; b. 05/2020 Echo: EF 60-65%, no rwma, Gr1 DD, nl RV size/fxn, mod dil LA. Mod AS; c. 05/2021 Echo: EF 50-55%, GrI DD; d. 05/2022 Echo: EF 40-45%, inf/septal HK, GrI DD, RVSP 37.42mmHg, mod dil LA, mild MR, mild AI, mod AS.   Myocardial infarction (HCC) 06/2018   PAD (peripheral artery disease) (HCC)    Sleep apnea    a. intolerant of CPAP    Past Surgical History:  Procedure Laterality Date   ARTHRODESIS POSTERIOR INTERBODY LUMBAR       CARDIAC CATHETERIZATION  2003   stent   CARPAL TUNNEL RELEASE Right 10/24/2017   Procedure: CARPAL TUNNEL RELEASE ENDOSCOPIC;  Surgeon: Edie Norleen PARAS, MD;  Location: [facility];  Service: Orthopedics;  Laterality: Right;  sleep apnea   CERVICAL SPINE SURGERY Right 01/23/2017   arthrodesis, discectomy, osteophytectomy, decompression  C3-C4, [facility]   COLONOSCOPY     CORONARY STENT INTERVENTION N/A 07/08/2018   Procedure: CORONARY STENT INTERVENTION;  Surgeon: Darron Deatrice LABOR, MD;  Location: [facility];  Service: Cardiovascular;  Laterality: N/A;   HOLEP-LASER ENUCLEATION OF THE PROSTATE WITH MORCELLATION N/A 03/12/2020   Procedure: HOLEP-LASER ENUCLEATION OF THE PROSTATE WITH MORCELLATION;  Surgeon: Francisca Redell BROCKS, MD;  Location: [facility];  Service: Urology;  Laterality: N/A;   JOINT REPLACEMENT Left    tkr   KNEE SURGERY Right 1966   LEFT HEART CATH AND CORONARY ANGIOGRAPHY N/A 07/08/2018   Procedure: LEFT HEART CATH AND CORONARY ANGIOGRAPHY;  Surgeon: Darron Deatrice LABOR, MD;  Location: [facility];  Service: Cardiovascular;  Laterality: N/A;   REPLACEMENT TOTAL KNEE Left 2005   RIGHT/LEFT HEART CATH AND CORONARY ANGIOGRAPHY Bilateral 07/03/2022   Procedure: RIGHT/LEFT HEART CATH AND CORONARY ANGIOGRAPHY;  Surgeon: Darron Deatrice LABOR, MD;  Location: [facility];  Service: Cardiovascular;  Laterality: Bilateral;     Current Outpatient Medications  Medication Sig Dispense Refill   acetaminophen  (TYLENOL ) 500  MG tablet Take 1,000 mg by mouth every 8 (eight) hours as needed for moderate pain.     albuterol  (PROVENTIL  HFA;VENTOLIN  HFA) 108 (90 Base) MCG/ACT inhaler Inhale 2 puffs into the lungs daily.     aspirin  81 MG tablet Take 81 mg by mouth at bedtime.      colchicine 0.6 MG tablet Take 0.6 mg by mouth as needed.     Evolocumab  (REPATHA  SURECLICK) 140 MG/ML SOAJ Inject 140 mg into the skin every 14 (fourteen) days. 2 mL 11   fluticasone (FLONASE) 50 MCG/ACT nasal spray Place 1 spray into both nostrils at bedtime.     MYRBETRIQ  50 MG TB24 tablet TAKE 1 TABLET (50 MG TOTAL) BY MOUTH DAILY AS NEEDED (OVERACTIVE BLADDER SYMPTOMS/LEAKAGE). 30 tablet 11   Naphazoline HCl (CLEAR EYES OP) Place 1 drop into both eyes daily as needed (redness).     nitroGLYCERIN  (NITROSTAT ) 0.4 MG SL tablet Place 1 tablet (0.4 mg total) under the tongue every 5 (five) minutes as needed for chest pain. 25 tablet 0   omeprazole (PRILOSEC) 20 MG capsule Take  1 tablet by mouth daily as needed.     sacubitril -valsartan  (ENTRESTO ) 97-103 MG TAKE 1 TABLET BY MOUTH TWICE A DAY (Patient taking differently: Take 0.5 tablets by mouth 2 (two) times daily.) 60 tablet 0   spironolactone  (ALDACTONE ) 25 MG tablet TAKE 1 TABLET (25 MG TOTAL) BY MOUTH DAILY. 90 tablet 3   TRULICITY 0.75 MG/0.5ML SOPN Inject 0.75 mg into the skin every Monday.     No current facility-administered medications for this visit.    Allergies:   Capsicum, Ticagrelor , and Ampicillin    Social History:  The patient  reports that he quit smoking about 37 years ago. His smoking use included cigarettes. He has been exposed to tobacco smoke. He has never used smokeless tobacco. He reports current alcohol use of about 11.0 standard drinks of alcohol per week. He reports that he does not currently use drugs.   Family History:  The patient's family history includes Alzheimer's disease in his mother; CAD in his father; Diabetes in his mother; Kidney cancer in his father;  Lung cancer in his father.    ROS:  Please see the history of present illness.   Otherwise, review of systems are positive for none.   All other systems are reviewed and negative.    PHYSICAL EXAM: VS:  BP (!) 142/80 (BP Location: Left Arm, Patient Position: Sitting, Cuff Size: Large)   Pulse 74   Ht 6' 2 (1.88 m)   Wt 261 lb 9.6 oz (118.7 kg)   SpO2 97%   BMI 33.59 kg/m  , BMI Body mass index is 33.59 kg/m. GEN: Well nourished, well developed, in no acute distress  HEENT: normal  Neck: no JVD, carotid bruits, or masses Cardiac: RRR; no rubs, or gallops,no edema . 2 /6 systolic ejection murmur in the aortic area which is mid peaking with mildly diminished S2 Respiratory:  clear to auscultation bilaterally, normal work of breathing GI: soft, nontender, nondistended, + BS MS: no deformity or atrophy  Skin: warm and dry, no rash Neuro:  Strength and sensation are intact Psych: euthymic mood, full affect Radial pulses normal.   EKG:  EKG is not ordered today.    Recent Labs: No results found for requested labs within last 365 days.    Lipid Panel    Component Value Date/Time   CHOL 169 09/21/2022 1036   TRIG 154 (H) 09/21/2022 1036   HDL 38 (L) 09/21/2022 1036   CHOLHDL 4.4 09/21/2022 1036   VLDL 31 09/21/2022 1036   LDLCALC 100 (H) 09/21/2022 1036      Wt Readings from Last 3 Encounters:  08/05/24 261 lb 9.6 oz (118.7 kg)  05/05/24 257 lb 9.6 oz (116.8 kg)  04/28/24 258 lb (117 kg)          04/25/2024    8:16 AM 04/09/2018    2:57 PM  PAD Screen  Previous PAD dx? Yes No  Previous surgical procedure? No No  Pain with walking? Yes Yes  Subsides with rest? Yes No  Feet/toe relief with dangling? Yes No  Painful, non-healing ulcers? No No  Extremities discolored? No Yes      ASSESSMENT AND PLAN:  1.  Coronary artery disease involving native coronary arteries with stable angina: Cardiac catheterization in 2023 showed patent RCA and left circumflex stents  with moderate nonobstructive disease.  Continue medical therapy.  He did not tolerate beta-blockers in the past.  I reviewed the results of recent nuclear stress test with him that showed no  evidence of ischemia.  Continue medical therapy  2. Peripheral arterial disease: Most recent ABI in March was normal with slightly abnormal toe pressure.  No evidence of critical disease.  3.   Moderate aortic stenosis: He is scheduled for repeat echocardiogram in September.  4.  Chronic systolic heart failure with improved ejection fraction: He is intolerant to beta-blockers due to fatigue.  Continue Entresto  and spironolactone .  Most recent ejection fraction was normal.  5.  Hyperlipidemia: Intolerance to statins.  Doing well on Repatha  with most recent LDL of 20.  6.  Essential hypertension: Blood pressure is controlled on current medications.    Disposition:   FU with me in 6 months     Signed, Deatrice Cage, MD 08/05/24 [facility] [place], [place] [phone]

## 2024-08-31 ENCOUNTER — Other Ambulatory Visit: Payer: Self-pay | Admitting: Cardiovascular Disease

## 2024-08-31 DIAGNOSIS — I25118 Atherosclerotic heart disease of native coronary artery with other forms of angina pectoris: Secondary | ICD-10-CM

## 2024-08-31 DIAGNOSIS — E785 Hyperlipidemia, unspecified: Secondary | ICD-10-CM

## 2024-09-01 ENCOUNTER — Telehealth: Payer: Self-pay | Admitting: Pharmacy Technician

## 2024-09-01 NOTE — Telephone Encounter (Signed)
 Pt c/o medication issue:  1. Name of Medication: repatha    2. How are you currently taking this medication (dosage and times per day)? As written  3. Are you having a reaction (difficulty breathing--STAT)? No  4. What is your medication issue? [employer] called stating that the patient's plan will only cover for 28 days. [employer] gave a call back number of [phone], Opt 2 and reference number of [id number]. Please advise.

## 2024-09-01 NOTE — Telephone Encounter (Signed)
   Pharmacy Patient Advocate Encounter   Received notification from Onbase that prior authorization for repatha  is required/requested.   Insurance verification completed.   The patient is insured through [facility] [employer] .   Per test claim: PA required; PA submitted to above mentioned insurance via Latent Key/confirmation #/EOC [id number] Status is pending

## 2024-09-02 NOTE — Telephone Encounter (Signed)
 I dont know why [employer] keeps sending that but it was put in for the correct day supply

## 2024-09-03 DIAGNOSIS — E782 Mixed hyperlipidemia: Secondary | ICD-10-CM | POA: Diagnosis not present

## 2024-09-03 DIAGNOSIS — Z0000 Encounter for general adult medical examination without abnormal findings: Secondary | ICD-10-CM | POA: Diagnosis not present

## 2024-09-03 DIAGNOSIS — I2510 Atherosclerotic heart disease of native coronary artery without angina pectoris: Secondary | ICD-10-CM | POA: Diagnosis not present

## 2024-09-03 DIAGNOSIS — E119 Type 2 diabetes mellitus without complications: Secondary | ICD-10-CM | POA: Diagnosis not present

## 2024-09-03 DIAGNOSIS — J449 Chronic obstructive pulmonary disease, unspecified: Secondary | ICD-10-CM | POA: Diagnosis not present

## 2024-09-03 DIAGNOSIS — I350 Nonrheumatic aortic (valve) stenosis: Secondary | ICD-10-CM | POA: Diagnosis not present

## 2024-09-03 DIAGNOSIS — I10 Essential (primary) hypertension: Secondary | ICD-10-CM | POA: Diagnosis not present

## 2024-09-03 DIAGNOSIS — Z125 Encounter for screening for malignant neoplasm of prostate: Secondary | ICD-10-CM | POA: Diagnosis not present

## 2024-09-03 DIAGNOSIS — Z1331 Encounter for screening for depression: Secondary | ICD-10-CM | POA: Diagnosis not present

## 2024-09-18 ENCOUNTER — Ambulatory Visit: Attending: Cardiology

## 2024-09-18 DIAGNOSIS — R42 Dizziness and giddiness: Secondary | ICD-10-CM

## 2024-09-18 DIAGNOSIS — I2510 Atherosclerotic heart disease of native coronary artery without angina pectoris: Secondary | ICD-10-CM | POA: Diagnosis not present

## 2024-09-18 LAB — ECHOCARDIOGRAM COMPLETE
AR max vel: 0.94 cm2
AV Area VTI: 0.86 cm2
AV Area mean vel: 0.79 cm2
AV Mean grad: 23.3 mmHg
AV Peak grad: 33.7 mmHg
Ao pk vel: 2.9 m/s
Area-P 1/2: 3.91 cm2
S' Lateral: 5.01 cm

## 2024-09-22 ENCOUNTER — Ambulatory Visit: Payer: Self-pay | Admitting: Cardiology

## 2024-09-26 ENCOUNTER — Other Ambulatory Visit: Payer: Self-pay | Admitting: Cardiovascular Disease

## 2024-09-26 MED ORDER — SACUBITRIL-VALSARTAN 97-103 MG PO TABS: 1.0000 | ORAL_TABLET | Freq: Two times a day (BID) | ORAL | 3 refills | Status: AC

## 2024-11-06 ENCOUNTER — Telehealth: Payer: Self-pay | Admitting: Cardiovascular Disease

## 2024-11-06 NOTE — Telephone Encounter (Signed)
 Patient currently has RX for spironolactone  (ALDACTONE ) 25 MG tablet daily,   Called patient and spoke with him and his wife, patient has taken colchicine with no relief, BP is slightly elevated 150/86 while on the phone heart rate 86, wife states LE is red and warm to touch, has not taken spiro in several weeks, asked patient to press on LE and advise if it is slow to return - pitting edema noted, patient states that his leg is very painful - advise to sit tonight with legs elevated and scheduled OV with Sheri for Friday

## 2024-11-06 NOTE — Telephone Encounter (Signed)
 Pt c/o swelling: STAT is pt has developed SOB within 24 hours  How much weight have you gained and in what time span? no  If swelling, where is the swelling located? Left ankle to the toes is very swollen and right foot is slightly swollen   Are you currently taking a fluid pill? Patient has them but not currently taking them  Are you currently SOB? no  Do you have a log of your daily weights (if so, list)?    Have you gained 3 pounds in a day or 5 pounds in a week? no  Have you traveled recently? no

## 2024-11-07 ENCOUNTER — Ambulatory Visit
Admission: RE | Admit: 2024-11-07 | Discharge: 2024-11-07 | Disposition: A | Discharge status: Home / Self Care | Source: Ambulatory Visit | Attending: Cardiology | Admitting: Cardiology

## 2024-11-07 ENCOUNTER — Ambulatory Visit: Payer: Self-pay | Admitting: Cardiology

## 2024-11-07 ENCOUNTER — Ambulatory Visit: Admitting: Cardiology

## 2024-11-07 ENCOUNTER — Other Ambulatory Visit: Payer: Self-pay | Admitting: Cardiology

## 2024-11-07 ENCOUNTER — Encounter: Payer: Self-pay | Admitting: Cardiology

## 2024-11-07 ENCOUNTER — Telehealth: Payer: Self-pay

## 2024-11-07 VITALS — BP 150/50 | HR 58 | Ht 74.0 in | Wt 261.6 lb

## 2024-11-07 DIAGNOSIS — E782 Mixed hyperlipidemia: Secondary | ICD-10-CM | POA: Diagnosis not present

## 2024-11-07 DIAGNOSIS — M7989 Other specified soft tissue disorders: Secondary | ICD-10-CM | POA: Diagnosis not present

## 2024-11-07 DIAGNOSIS — I5020 Unspecified systolic (congestive) heart failure: Secondary | ICD-10-CM

## 2024-11-07 DIAGNOSIS — I25118 Atherosclerotic heart disease of native coronary artery with other forms of angina pectoris: Secondary | ICD-10-CM | POA: Diagnosis not present

## 2024-11-07 DIAGNOSIS — I350 Nonrheumatic aortic (valve) stenosis: Secondary | ICD-10-CM | POA: Diagnosis not present

## 2024-11-07 DIAGNOSIS — I739 Peripheral vascular disease, unspecified: Secondary | ICD-10-CM | POA: Diagnosis not present

## 2024-11-07 DIAGNOSIS — I10 Essential (primary) hypertension: Secondary | ICD-10-CM | POA: Diagnosis not present

## 2024-11-07 DIAGNOSIS — R600 Localized edema: Secondary | ICD-10-CM | POA: Diagnosis not present

## 2024-11-07 MED ORDER — CLINDAMYCIN HCL 300 MG PO CAPS: 300.0000 mg | ORAL_CAPSULE | Freq: Four times a day (QID) | ORAL | 0 refills | Status: AC

## 2024-11-07 NOTE — Progress Notes (Signed)
 Cardiology Office Note   Date:  11/07/2024  ID:  Kurt Kelley, DOB June 15, 1946, MRN [id number] PCP: Valora Lynwood FALCON, MD  [facility] Providers Cardiologist:  Deatrice Cage, MD     History of Present Illness Kurt Kelley is a 78 y.o. male with a past medical history of coronary artery disease status post prior RCA and circumflex stenting, nonischemic cardiomyopathy, moderate aortic stenosis, HFpEF, hypertension, hyperlipidemia, (statin intolerant), obesity, chronic low back pain, peripheral arterial disease/colonic lesion, sleep apnea with CPAP intolerance, who is here today for follow-up.   Previous vascular evaluation in May 2019 showed ABI's of 1.05 on the right and 0.9 cm on the left.  Duplex showed mild atherosclerosis of the right lower extremity.  On the left there was moderate activities affecting the distal SFA.  He previously undergone LHC with PCI/DES to the left circumflex.  Repeat stress testing in 2020 was nonischemic.  Echocardiogram revealed LVEF of 60 to 65%, mildly increased LV wall thickness, G1 DD, moderate calcification of the aortic valve, aortic valve regurgitation is mild, moderate stenosis of aortic valve with a mean gradient of 20 mmHg, peak gradient of 38 mmHg, AVA 1.28 cm.  Repeat echocardiogram done in 2021 revealed LVEF 60 to 65%, no RWMA, G1 DD, moderate aortic valve stenosis, aortic valve area by VTI measures 1.47 cm, mean gradient measured 26 mmHg, aortic valve V-max measured 3.48m/s.  Repeat Myoview  completed 03/2021 revealed no evidence of ischemia.  Echocardiogram completed 05/2021 revealed LVEF 50-55%, G1 DD, no RWMA, mild mitral valve regurgitation, mean aortic valve gradient of 27 mmHg, peak gradient 45 mmHg, V-max 3.58m/s, AVA 1.5 cm, moderate aortic valve stenosis.  Echocardiogram 05/2022 revealed a drop in LVEF of 40-45%, with wall motion abnormalities, G1 DD, moderate aortic valve stenosis by VTI measuring 1.67 cm.  Right and left heart  catheterization completed 07/03/2022 revealed patent RCA and left circumflex stent with no significant restenosis.  Moderate three-vessel coronary artery disease with some progression of mid LAD stenosis but still does not seem to be obstructive.  Mild aortic stenosis with a mean gradient of 11 mmHg.  Mild to moderately reduced LV systolic function with an EF of 40-45%.  Limited echocardiogram completed 09/15/2022 revealed LVEF 50-55%, no RWMA, mild LVH, mild MR, moderate aortic valve stenosis, aortic valve mean gradient measured 23 mmHg, aortic valve V-max measured 3.96m/s.  Echocardiogram completed 09/11/2023 revealed an LVEF of 55 to 60%, no RWMA, G1 DD, moderate calcification of the aortic valve, mild AR, moderate aortic valve stenosis, aortic valve area VTI measures 1.54 cm, mean gradient measured 24.2 mmHg, aortic valve V-max measures 3.58m/s.   Evaluated by Dr. Cage in February 2025.  At that time he reported he not been doing well for the last 2 months unless concerns.  Had increased palpitations, associated dizziness but no syncope.  Was placed on ZIO monitor for 2 weeks and advised to follow-up with neurology.  He was seen in clinic 03/18/2024 with continued symptoms.  He had noted an increased amount of fatigue but denied syncope or near syncope.  ZIO monitoring was reviewed.  As well as AP somewhat largely unchanged.  He had several questions related to his aortic valve stenosis.  He was scheduled for repeat echocardiogram in 6 months and was referred to structural heart for further evaluation of aortic stenosis and to get an idea of possible treatment plans.  He was evaluated by Dr. Wonda on 04/28/2024 for his moderate aortic stenosis.  They discussed the natural history  of aortic stenosis.  Discussed the limitations of medical therapy and the poor prognosis associated with symptomatic aortic stenosis.  They reviewed potential treatment options, including palliative medical therapy, conventional  surgical aortic valve replacement, transcatheter aortic valve replacement.  They discussed treatment options in the context of the patient's specific medical conditions.  Echo 0 cardiograms over the past 3 years were compared and there was really no progressive worsening of aortic valve gradients.  Gradient managements of the aortic valve dimensions remain within the moderate range.   He was seen in clinic 05/05/2024.  He continued to have some lightheadedness and dizziness, fatigue, noted some chest discomfort midsternal happens with exertion at rest.  He recently had carotid duplex that was reassuring.  Did stop taking his Imdur  but continued to have lightheaded and dizziness but believes some of that improved since being off Imdur .  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  He was scheduled for Lexiscan  Myoview  for ongoing chest discomfort.  Was continued on his current medication regimen.   He was last seen in clinic 08/05/2024 by Dr. Darron.  At that time he was doing reasonly well without chest pain shortness of breath or palpitations.  He was continued on his current medication regimen without any changes made and no further testing was ordered at this time.  He returns to clinic today accompanied by his wife.  He denies any chest pain.  Continues to have chronic shortness of breath.  Denies any palpitations.  Since Tuesday of this week he has had swelling in his left lower extremity which has now caused redness and heat to the top of his foot which was concerning.  He contacted the nurse triage line yesterday and his appointment was made for today as an acute visit.  He stated that he had some old colchicine at home as he has had gout in the past and had been taking colchicine as previously prescribed without any impact to the foot.  States that he has been compliant with his medications with the exception of spironolactone  he has not been taking it as prescribed as he has had issues  with his prostate in the past and with increased urination opted to stop taking the medication.  He denies any recent hospitalizations or visits to the emergency department.  ROS: 10 point review of systems has been reviewed and considered negative the exception was been listed in the HPI  Studies Reviewed     2d echo 09/18/2024 1. Left ventricular ejection fraction, by estimation, is 50 to 55%. The  left ventricle has low normal function. The left ventricle demonstrates  regional wall motion abnormalities (hypokinesis of the basal to mid  inferior and septal wall). There is  moderate left ventricular hypertrophy. Left ventricular diastolic  parameters are consistent with Grade I diastolic dysfunction (impaired  relaxation).   2. Right ventricular systolic function is normal. The right ventricular  size is normal.   3. The mitral valve is normal in structure. No evidence of mitral valve  regurgitation. No evidence of mitral stenosis.   4. The aortic valve is normal in structure. There is moderate  calcification of the aortic valve. Aortic valve regurgitation is mild.  Moderate aortic valve stenosis. Aortic valve area, by VTI measures 0.86  cm. Aortic valve mean gradient measures 23.3  mmHg. Aortic valve Vmax measures 2.90 m/s.   5. The inferior vena cava is normal in size with greater than 50%  respiratory variability, suggesting right atrial  pressure of 3 mmHg.   6. Frequent PVCs   Lexiscan  MPI 06/05/2024   Findings are consistent with infarction. The study is low risk.   No ST deviation was noted. Arrhythmias during stress: frequent PVCs. The ECG was negative for ischemia.   LV perfusion is abnormal. There is no evidence of ischemia. There is evidence of infarction. Defect 1: There is a medium defect with moderate reduction in uptake present in the mid to basal inferior and inferolateral location(s) that is fixed. There is abnormal wall motion in the defect area. Consistent with  infarction.   Left ventricular function is abnormal. Nuclear stress EF: 53%. The left ventricular ejection fraction is mildly decreased (45-54%). End diastolic cavity size is mildly enlarged. End systolic cavity size is mildly enlarged.  Carotid Duplex 04/18/2024 Summary:  Right Carotid: The extracranial vessels were near-normal with only minimal  wall thickening or plaque.   Left Carotid: The extracranial vessels were near-normal with only minimal  Wall thickening or plaque.   Vertebrals:  Bilateral vertebral arteries demonstrate antegrade flow.  Subclavians: Normal flow hemodynamics were seen in bilateral subclavian arteries.    2D echo 03/14/2024 1. Left ventricular ejection fraction, by estimation, is 55 to 60%. The  left ventricle has normal function. The left ventricle has no regional  wall motion abnormalities. There is mild left ventricular hypertrophy.  Left ventricular diastolic parameters  are consistent with Grade I diastolic dysfunction (impaired relaxation).   2. Right ventricular systolic function is normal. The right ventricular  size is normal.   3. The mitral valve is normal in structure. No evidence of mitral valve  regurgitation.   4. The aortic valve is calcified. Aortic valve regurgitation is mild.  Moderate aortic valve stenosis. Aortic valve area, by VTI measures 1.22  cm. Aortic valve mean gradient measures 21.8 mmHg. Aortic valve Vmax  measures 3.12 m/s.   5. The inferior vena cava is normal in size with greater than 50%  respiratory variability, suggesting right atrial pressure of 3 mmHg.   Comparison(s): Previous AV meas: max PG, mean.    Event Monitor (Zio) 02/14/24 Patient had a min HR of 45 bpm, max HR of 169 bpm, and avg HR of 72 bpm. Predominant underlying rhythm was Sinus Rhythm. 2 Supraventricular Tachycardia runs occurred, the run with the fastest interval lasting 4 beats with a max rate of 169 bpm, the  longest lasting 4 beats with  an avg rate of 105 bpm.  Occasional PVCs with a burden of 1.9%.   TTE 09/11/23 1. Left ventricular ejection fraction, by estimation, is 55 to 60%. The  left ventricle has normal function. The left ventricle has no regional  wall motion abnormalities. The left ventricular internal cavity size was  mildly dilated. Left ventricular  diastolic parameters are consistent with Grade I diastolic dysfunction  (impaired relaxation).   2. Right ventricular systolic function is normal. The right ventricular  size is normal.   3. Left atrial size was mildly dilated.   4. The mitral valve is normal in structure. No evidence of mitral valve  regurgitation. No evidence of mitral stenosis.   5. The aortic valve is calcified. There is moderate calcification of the  aortic valve. Aortic valve regurgitation is mild. Moderate aortic valve  stenosis. Aortic valve area, by VTI measures 1.54 cm. Aortic valve mean  gradient measures 24.2 mmHg. Aortic   valve Vmax measures 3.33 m/s.   6. The inferior vena cava is normal in size with greater  than 50%  respiratory variability, suggesting right atrial pressure of 3 mmHg.    Limited TTE 09/15/22 1. Left ventricular ejection fraction, by estimation, is 50 to 55%. The  left ventricle has low normal function. The left ventricle has no regional  wall motion abnormalities. The left ventricular internal cavity size was  mildly dilated. There is mild  left ventricular hypertrophy. Left ventricular diastolic parameters are  consistent with Grade I diastolic dysfunction (impaired relaxation). The  average left ventricular global longitudinal strain is -12.8 %.   2. Right ventricular systolic function is normal. The right ventricular  size is normal. Tricuspid regurgitation signal is inadequate for assessing  PA pressure.   3. The mitral valve is normal in structure. Mild mitral valve  regurgitation. No evidence of mitral stenosis.   4. The aortic valve is normal in  structure. Aortic valve regurgitation is  not visualized. Moderate aortic valve stenosis. Aortic valve mean gradient  measures 23.0 mmHg. Aortic valve Vmax measures 3.09 m/s.   5. The inferior vena cava is normal in size with greater than 50%  respiratory variability, suggesting right atrial pressure of 3 mmHg.    [facility] 07/03/22   Dist Cx lesion is 40% stenosed.   Dist RCA lesion is 50% stenosed.   Prox LAD to Mid LAD lesion is 50% stenosed.   Non-stenotic Mid Cx lesion was previously treated.   Non-stenotic Prox RCA lesion was previously treated.   There is mild to moderate left ventricular systolic dysfunction.   LV end diastolic pressure is mildly elevated.   1.  Patent RCA and left circumflex stents with no significant restenosis.  Moderate three-vessel coronary artery disease with some progression of mid LAD stenosis but still does not seem to be obstructive. 2.  Mildly to moderately reduced LV systolic function with an EF of 40 to 45%. 3.  Right heart catheterization showed high normal filling pressures, no significant pulmonary hypertension and normal cardiac output. 4.  Mild aortic stenosis with a mean gradient of 11 mmHg.   Recommendations: Continue aggressive medical therapy for coronary artery disease and HF systolic heart failure.  The patient's volume status appears to be optimal. Risk Assessment/Calculations     Physical Exam VS:  BP (!) 150/50 (BP Location: Left Arm, Patient Position: Sitting, Cuff Size: Large)   Pulse (!) 58   Ht 6' 2 (1.88 m)   Wt 261 lb 9.6 oz (118.7 kg)   SpO2 98%   BMI 33.59 kg/m        Wt Readings from Last 3 Encounters:  11/07/24 261 lb 9.6 oz (118.7 kg)  08/05/24 261 lb 9.6 oz (118.7 kg)  05/05/24 257 lb 9.6 oz (116.8 kg)    GEN: Well nourished, well developed in no acute distress NECK: No JVD; No carotid bruits, heart murmur CARDIAC: RRR, II-III/VI systolic murmur R/LSB, without rubs or gallops RESPIRATORY:  Clear to auscultation  without rales, wheezing or rhonchi  ABDOMEN: Soft, non-tender, non-distended EXTREMITIES:  2+ LLE edema; No deformity , redness and warmth to the ankle and the foot, scabbed area noted to mid shin  ASSESSMENT AND PLAN Coronary artery disease of native coronary arteries with stable angina.  His last left heart catheterization was completed 06/2022 which revealed RCA left circumflex stents with no significant in-stent restenosis, moderate three-vessel coronary artery disease and progression of the mid LAD stenosis with mildly reduced LV systolic function with EF of 40-45%, right heart catheterization showed normal filling pressures with no significant pulmonary hypertension and  normal cardiac output.  Lab stent Lexiscan  Myoview  was considered low risk with no ischemia noted.  He has been continued on aspirin  81 mg daily, Repatha  140 mg every 2 weeks.  Chronic HFimpEF/ischemic cardiomyopathy with an LVEF 55 to 60% on echocardiogram.  He continues to remain fairly euvolemic on exam today with the exception of left lower extremity edema.  He continues to suffer from NYHA class I-II symptoms.  His weight has remained stable.  He has continued on Entresto  49/51 mg twice daily.  He has not been compliant with spironolactone  due to increased amount of urination.  Advised him if he is unable to tolerate the whole spironolactone  then lets consider taking half the spironolactone  which he is in agreement with unfortunately he is intolerant of beta-blocker therapy and he is not on currently beta-blockers.  He does not require standing diuretics due to being euvolemic.  Discussed SGLT2 inhibitors but with his current therapy it has been deferred due to cost.  Moderate aortic valve stenosis with last echocardiogram completed 09/18/2024 with an LVEF of 50-55%, low normal function, mild aortic regurgitation and moderate aortic stenosis that measured 0.86 cm.  He is scheduled for an updated echocardiogram 6 months from his  previous study.  Primary hypertension with a blood pressure today 150/50.  Blood pressure is slightly elevated due to discomfort and pain to his left lower extremity.  He has been continued on Entresto , encouraged to restart spironolactone .  He has also been advised to monitor pressures 1 to 2 hours postmedication administration as well.  Mixed hyperlipidemia continues to be at goal.  He has been continued on Repatha  140 mg twice weekly.  Peripheral arterial disease by recent ABI in March was normal with slightly abnormal toe pressure.  No evidence of critical disease.  Unilateral swelling to the left lower extremity with redness warmth and discomfort.  He has been sent for a stat ultrasound of the left lower extremity rule out DVT.  He had previously taken colchicine with no relief for possible gout.  He is being started on clindamycin  300 mg twice daily for 7 days for concerns of cellulitis.       Dispo: Patient to return to clinic to see MD/APP in 2 to 3 weeks for reassessment after being on antibiotics.  He has been advised that his ultrasound is positive for DVT that would require blood thinning medications as well.  Signed, Ivy Puryear, NP

## 2024-11-07 NOTE — Telephone Encounter (Signed)
 Called patient - spoke to wife to advise that he had no DVT and to continue the clindamycin  and elevate legs over the weekend - patient and wife verbalized understanding and agreement with plan

## 2024-11-07 NOTE — Progress Notes (Signed)
 No evidence of DVT.  Continue antibiotic therapy as previously prescribed.

## 2024-11-07 NOTE — Patient Instructions (Signed)
 Medication Instructions:  Your physician recommends the following medication changes.  START TAKING: Clindamycin  300 mg every 6 hours x 7 days  *If you need a refill on your cardiac medications before your next appointment, please call your pharmacy*  Lab Work: No labs ordered today  If you have labs (blood work) drawn today and your tests are completely normal, you will receive your results only by: MyChart Message (if you have MyChart) OR A paper copy in the mail If you have any lab test that is abnormal or we need to change your treatment, we will call you to review the results.  Testing/Procedures: Your physician has requested that you have a lower extremity venous duplex. This test is an ultrasound of the veins in the legs or arms. It looks at venous blood flow that carries blood from the heart to the legs or arms. Allow one hour for a Lower Venous exam. Allow thirty minutes for an Upper Venous exam. There are no restrictions or special instructions.This will take place at [address] [facility] [address], [place] [address]  Please note: We ask at that you not bring children with you during ultrasound (echo/ vascular) testing. Due to room size and safety concerns, children are not allowed in the ultrasound rooms during exams. Our front office staff cannot provide observation of children in our lobby area while testing is being conducted. An adult accompanying a patient to their appointment will only be allowed in the ultrasound room at the discretion of the ultrasound technician under special circumstances. We apologize for any inconvenience.    Follow-Up: At [facility], you and your health needs are our priority.  As part of our continuing mission to provide you with exceptional heart care, our providers are all part of one team.  This team includes your primary Cardiologist (physician) and Advanced Practice Providers or APPs (Physician Assistants and Nurse  Practitioners) who all work together to provide you with the care you need, when you need it.  Your next appointment:   2 - 3 week(s)  Provider:   You may see Deatrice Cage, MD or one of the following Advanced Practice Providers on your designated Care Team:   Tylene Lunch, NP

## 2024-11-14 ENCOUNTER — Telehealth: Payer: Self-pay | Admitting: Cardiovascular Disease

## 2024-11-14 NOTE — Telephone Encounter (Signed)
 Wife Lundy) called to report to S. Hammock, NP that patient has gout and canceled appointment on 11/24.

## 2024-11-17 ENCOUNTER — Ambulatory Visit: Admitting: Cardiology

## 2024-12-31 NOTE — Progress Notes (Signed)
 Kurt Kelley                                          MRN: [id number]   12/31/2024   The [facility] Quality Team Specialist reviewed this patient medical record for the purposes of chart review for care gap closure. The following were reviewed: chart review for care gap closure-kidney health evaluation for diabetes:eGFR  and uACR.    [facility]BCI Quality Team

## 2025-03-18 ENCOUNTER — Other Ambulatory Visit

## 2025-03-19 ENCOUNTER — Ambulatory Visit: Admitting: Cardiovascular Disease
